# Patient Record
Sex: Female | Born: 1970 | Race: White | Hispanic: No | Marital: Single | Smoking: Never smoker
Health system: Southern US, Community
[De-identification: ages and names within clinical notes are randomized; demographics above are authoritative.]

## PROBLEM LIST (undated history)

## (undated) DIAGNOSIS — N200 Calculus of kidney: Secondary | ICD-10-CM

## (undated) DIAGNOSIS — N289 Disorder of kidney and ureter, unspecified: Secondary | ICD-10-CM

## (undated) DIAGNOSIS — H269 Unspecified cataract: Secondary | ICD-10-CM

## (undated) DIAGNOSIS — T753XXA Motion sickness, initial encounter: Secondary | ICD-10-CM

## (undated) DIAGNOSIS — F419 Anxiety disorder, unspecified: Secondary | ICD-10-CM

## (undated) DIAGNOSIS — M5432 Sciatica, left side: Secondary | ICD-10-CM

## (undated) DIAGNOSIS — B019 Varicella without complication: Secondary | ICD-10-CM

## (undated) DIAGNOSIS — K921 Melena: Secondary | ICD-10-CM

## (undated) DIAGNOSIS — H3320 Serous retinal detachment, unspecified eye: Secondary | ICD-10-CM

## (undated) DIAGNOSIS — N800 Endometriosis of uterus: Secondary | ICD-10-CM

## (undated) DIAGNOSIS — R112 Nausea with vomiting, unspecified: Secondary | ICD-10-CM

## (undated) DIAGNOSIS — A630 Anogenital (venereal) warts: Secondary | ICD-10-CM

## (undated) DIAGNOSIS — Z9889 Other specified postprocedural states: Secondary | ICD-10-CM

## (undated) HISTORY — DX: Varicella without complication: B01.9

## (undated) HISTORY — DX: Calculus of kidney: N20.0

## (undated) HISTORY — DX: Anogenital (venereal) warts: A63.0

## (undated) HISTORY — DX: Melena: K92.1

## (undated) HISTORY — DX: Disorder of kidney and ureter, unspecified: N28.9

## (undated) HISTORY — DX: Endometriosis of uterus: N80.0

## (undated) HISTORY — DX: Anxiety disorder, unspecified: F41.9

## (undated) HISTORY — PX: RETINAL DETACHMENT SURGERY: SHX105

## (undated) HISTORY — PX: OTHER SURGICAL HISTORY: SHX169

---

## 1993-05-23 DIAGNOSIS — N8000 Endometriosis of the uterus, unspecified: Secondary | ICD-10-CM

## 1993-05-23 HISTORY — DX: Endometriosis of the uterus, unspecified: N80.00

## 1993-05-23 HISTORY — DX: Endometriosis of uterus: N80.0

## 2006-03-05 HISTORY — PX: GUM SURGERY: SHX658

## 2009-09-26 ENCOUNTER — Ambulatory Visit: Payer: Self-pay | Admitting: Otolaryngology

## 2010-10-27 ENCOUNTER — Emergency Department: Payer: Self-pay | Admitting: Emergency Medicine

## 2010-11-02 ENCOUNTER — Ambulatory Visit: Payer: Self-pay | Admitting: Unknown Physician Specialty

## 2010-11-28 ENCOUNTER — Encounter: Payer: Self-pay | Admitting: Unknown Physician Specialty

## 2010-12-04 ENCOUNTER — Encounter: Payer: Self-pay | Admitting: Unknown Physician Specialty

## 2011-01-04 ENCOUNTER — Encounter: Payer: Self-pay | Admitting: Unknown Physician Specialty

## 2013-09-02 ENCOUNTER — Telehealth: Payer: Self-pay | Admitting: *Deleted

## 2013-09-02 NOTE — Telephone Encounter (Signed)
Called and per pt's sister, [name], pt has constipation and bloating . They have family hx of colon cancer in their father. They are checking their schedule and will call back to schedule OV appt.

## 2013-09-02 NOTE — Telephone Encounter (Signed)
Pt's sister called wanting to get pt scheduled for a TCS per pt's sister Dr. Antonietta Jewel is sending a referral I made pt's sister aware that we do not have a referral. Please advise when you have the referral [phone]

## 2013-09-03 NOTE — Telephone Encounter (Signed)
Pt is scheduled an OV with Margaret Emperor, NP on 10/06/2013 at 2:00 pm.

## 2013-10-06 ENCOUNTER — Encounter (INDEPENDENT_AMBULATORY_CARE_PROVIDER_SITE_OTHER): Payer: Self-pay

## 2013-10-06 ENCOUNTER — Encounter: Payer: Self-pay | Admitting: Gastroenterology

## 2013-10-06 ENCOUNTER — Ambulatory Visit: Payer: BC Managed Care – PPO | Admitting: Gastroenterology

## 2013-10-06 VITALS — BP 94/63 | HR 72 | Temp 97.9°F | Resp 18 | Ht 68.0 in | Wt 147.0 lb

## 2013-10-09 DIAGNOSIS — Z1211 Encounter for screening for malignant neoplasm of colon: Secondary | ICD-10-CM | POA: Insufficient documentation

## 2013-10-09 NOTE — Progress Notes (Signed)
Present to discuss screening colonoscopy. Insurance will not cover at [facility] as this will have hospital charges associated. Patient to seek screening colonoscopy elsewhere. NO charge for today's visit.

## 2014-05-05 ENCOUNTER — Emergency Department: Payer: Self-pay | Admitting: Emergency Medicine

## 2014-06-16 ENCOUNTER — Ambulatory Visit
Admit: 2014-06-16 | Disposition: A | Discharge status: Home / Self Care | Payer: Self-pay | Attending: Neurology | Admitting: Neurology

## 2014-06-16 LAB — HCG, QUANTITATIVE, PREGNANCY

## 2014-07-16 ENCOUNTER — Ambulatory Visit: Payer: BC Managed Care – PPO | Admitting: Nurse Practitioner

## 2014-07-26 ENCOUNTER — Ambulatory Visit: Payer: BC Managed Care – PPO | Admitting: Nurse Practitioner

## 2014-08-16 ENCOUNTER — Encounter: Payer: Self-pay | Admitting: Nurse Practitioner

## 2014-08-16 ENCOUNTER — Telehealth: Payer: Self-pay | Admitting: *Deleted

## 2014-08-16 ENCOUNTER — Ambulatory Visit (INDEPENDENT_AMBULATORY_CARE_PROVIDER_SITE_OTHER): Payer: BC Managed Care – PPO | Admitting: Nurse Practitioner

## 2014-08-16 VITALS — BP 108/62 | HR 76 | Temp 97.1°F | Resp 12 | Ht 69.0 in | Wt 141.8 lb

## 2014-08-16 DIAGNOSIS — H6123 Impacted cerumen, bilateral: Secondary | ICD-10-CM | POA: Insufficient documentation

## 2014-08-16 DIAGNOSIS — Z130 Encounter for screening for diseases of the blood and blood-forming organs and certain disorders involving the immune mechanism: Secondary | ICD-10-CM

## 2014-08-16 DIAGNOSIS — Z7189 Other specified counseling: Secondary | ICD-10-CM | POA: Diagnosis not present

## 2014-08-16 DIAGNOSIS — Z131 Encounter for screening for diabetes mellitus: Secondary | ICD-10-CM | POA: Diagnosis not present

## 2014-08-16 DIAGNOSIS — Z1211 Encounter for screening for malignant neoplasm of colon: Secondary | ICD-10-CM

## 2014-08-16 DIAGNOSIS — E538 Deficiency of other specified B group vitamins: Secondary | ICD-10-CM | POA: Insufficient documentation

## 2014-08-16 DIAGNOSIS — Z7689 Persons encountering health services in other specified circumstances: Secondary | ICD-10-CM | POA: Insufficient documentation

## 2014-08-16 LAB — CBC WITH DIFFERENTIAL/PLATELET
Basophils Absolute: 0 10*3/uL (ref 0.0–0.1)
Basophils Relative: 0.2 % (ref 0.0–3.0)
EOS ABS: 0.1 10*3/uL (ref 0.0–0.7)
EOS PCT: 1.5 % (ref 0.0–5.0)
HEMATOCRIT: 40.4 % (ref 36.0–46.0)
Hemoglobin: 13.3 g/dL (ref 12.0–15.0)
LYMPHS ABS: 2.2 10*3/uL (ref 0.7–4.0)
Lymphocytes Relative: 33.2 % (ref 12.0–46.0)
MCHC: 33 g/dL (ref 30.0–36.0)
MCV: 91.4 fl (ref 78.0–100.0)
MONO ABS: 0.5 10*3/uL (ref 0.1–1.0)
Monocytes Relative: 8.1 % (ref 3.0–12.0)
NEUTROS PCT: 57 % (ref 43.0–77.0)
Neutro Abs: 3.8 10*3/uL (ref 1.4–7.7)
Platelets: 237 10*3/uL (ref 150.0–400.0)
RBC: 4.42 Mil/uL (ref 3.87–5.11)
RDW: 13.7 % (ref 11.5–15.5)
WBC: 6.7 10*3/uL (ref 4.0–10.5)

## 2014-08-16 LAB — HEMOGLOBIN A1C: Hgb A1c MFr Bld: 5.3 % (ref 4.6–6.5)

## 2014-08-16 NOTE — Telephone Encounter (Signed)
Called pt to come back to have blood re-draw and explained that it was my error

## 2014-08-16 NOTE — Telephone Encounter (Signed)
Pt came back.  

## 2014-08-16 NOTE — Assessment & Plan Note (Addendum)
Discussed acute and chronic issues. Reviewed health maintenance measures, PFSHx, and immunizations. Obtain routine labs CBC w/ diff and A1c today, non-fasting. Will obtain fasting labs in 3 months

## 2014-08-16 NOTE — Progress Notes (Signed)
Pre visit review using our clinic review tool, if applicable. No additional management support is needed unless otherwise documented below in the visit note. 

## 2014-08-16 NOTE — Assessment & Plan Note (Signed)
Set up for next week. Family hx of colon cancer.

## 2014-08-16 NOTE — Patient Instructions (Addendum)
Please visit the lab before leaving today.   Follow up in 3 months for fasting labs and physical if you are due for one.   Welcome to [facility]!

## 2014-08-16 NOTE — Assessment & Plan Note (Signed)
Patient seeing Dr. Manuella Ghazi for B12 replacement therapy. Pt has 3 months left she reports. Positive for tingling in bilateral legs

## 2014-08-16 NOTE — Assessment & Plan Note (Signed)
Patient (and sister) report excessive wax, they have debrox at home. Today pt TM's are visible without signs of infection or disease process. Asked pt to return if worsens or decreased hearing bilaterally.

## 2014-08-16 NOTE — Progress Notes (Signed)
Subjective:    Patient ID: Margaret Mcfarland, female    DOB: 06-21-1970, 44 y.o.   MRN: [id number]  HPI  Margaret Mcfarland is a 44 yo female establishing care today.   1) New pt info:  Immunizations- tdap 5 yrs ago  Mammogram- 2015- normal   Pap- 2015; sees GYN, normal   Colonoscopy- getting colonoscopy on 08/26/14  Eye Exam- 2015, new prescription for glasses   Dental Exam- UTD; going next month   2) Chronic Problems-  Dr. Manuella Ghazi- Neurology; B-12 shots monthly with 3 left; tingling in both legs   3) Acute Problems- Margaret Mcfarland would like to me to check her ears, denies current problems and has been checked out by ENT in past. Margaret Mcfarland reports Left ear- decreased hearing and some dizziness with looking over shoulder.     Review of Systems  Constitutional: Negative for fever, chills, diaphoresis and fatigue.  HENT: Positive for hearing loss. Negative for ear discharge and ear pain.   Respiratory: Negative for chest tightness, shortness of breath and wheezing.   Cardiovascular: Negative for chest pain, palpitations and leg swelling.  Gastrointestinal: Negative for nausea, vomiting and diarrhea.  Skin: Negative for rash.  Neurological: Negative for dizziness, weakness, numbness and headaches.  Psychiatric/Behavioral: The patient is not nervous/anxious.    Past Medical History  Diagnosis Date  . Blood in stool   . Chicken pox   . Genital warts     History   Social History  . Marital Status: Single    Spouse Name: N/A  . Number of Children: N/A  . Years of Education: N/A   Occupational History  . Not on file.   Social History Main Topics  . Smoking status: Never Smoker   . Smokeless tobacco: Never Used  . Alcohol Use: No  . Drug Use: No  . Sexual Activity: No   Other Topics Concern  . Not on file   Social History Narrative   Lives with twin sister   Work- [employer]    No pets    No children    Right handed    No caffeine daily- tea occasionally; eats chocolate      Enjoys- shopping and eating out     Past Surgical History  Procedure Laterality Date  . Gum surgery  2008  . Endoscopy    . Laproscopy      Family History  Problem Relation Age of Onset  . Colon cancer Father   . Arthritis Father   . Cancer Father     colon/prostate  . Arthritis Mother   . Hyperlipidemia Mother   . Heart disease Mother   . Stroke Mother   . Hypertension Mother   . Kidney disease Mother   . Diabetes Mother   . Stroke Maternal Grandmother   . Cancer Maternal Grandfather     prostate  . Stroke Paternal Grandfather     No Known Allergies  Current Outpatient Prescriptions on File Prior to Visit  Medication Sig Dispense Refill  . Levonorgest-Eth Estrad 91-Day (JOLESSA PO) Take by mouth daily.     No current facility-administered medications on file prior to visit.      Objective:   Physical Exam  Constitutional: Margaret Mcfarland is oriented to person, place, and time. Margaret Mcfarland appears well-developed and well-nourished. No distress.  BP 108/62 mmHg  Pulse 76  Temp(Src) 97.1 F (36.2 C) (Oral)  Resp 12  Ht 5\' 9"  (1.753 m)  Wt 141 lb 12.8 oz (64.32 kg)  BMI 20.93 kg/m2  SpO2 96%  LMP 02/14/2014   HENT:  Head: Normocephalic and atraumatic.  Right Ear: External ear normal.  Left Ear: External ear normal.  Nose: Nose normal.  Mouth/Throat: Oropharynx is clear and moist. No oropharyngeal exudate.  TMs and canals clear and visible with minimal cerumen in each canal  Eyes: Conjunctivae and EOM are normal. Pupils are equal, round, and reactive to light. Right eye exhibits no discharge. Left eye exhibits no discharge. No scleral icterus.  Neck: Normal range of motion. Neck supple. No thyromegaly present.  Cardiovascular: Normal rate, regular rhythm and normal heart sounds.  Exam reveals no gallop and no friction rub.   No murmur heard. Pulmonary/Chest: Effort normal and breath sounds normal. No respiratory distress. Margaret Mcfarland has no wheezes. Margaret Mcfarland has no rales. Margaret Mcfarland exhibits no  tenderness.  Musculoskeletal: Normal range of motion. Margaret Mcfarland exhibits no edema or tenderness.  Lymphadenopathy:    Margaret Mcfarland has no cervical adenopathy.  Neurological: Margaret Mcfarland is alert and oriented to person, place, and time. Margaret Mcfarland has normal reflexes. No cranial nerve deficit. Margaret Mcfarland exhibits normal muscle tone. Coordination normal.  Skin: Skin is warm and dry. No rash noted. Margaret Mcfarland is not diaphoretic. No erythema. No pallor.  Psychiatric: Margaret Mcfarland has a normal mood and affect. Her behavior is normal. Judgment and thought content normal.      Assessment & Plan:

## 2014-08-18 ENCOUNTER — Ambulatory Visit: Payer: BC Managed Care – PPO | Admitting: Nurse Practitioner

## 2014-08-25 ENCOUNTER — Encounter: Payer: Self-pay | Admitting: *Deleted

## 2014-08-25 ENCOUNTER — Ambulatory Visit: Payer: BC Managed Care – PPO | Admitting: Anesthesiology

## 2014-08-25 ENCOUNTER — Encounter
Admission: RE | Disposition: A | Discharge status: Home / Self Care | Payer: Self-pay | Source: Ambulatory Visit | Attending: Unknown Physician Specialty

## 2014-08-25 ENCOUNTER — Ambulatory Visit
Admission: RE | Admit: 2014-08-25 | Discharge: 2014-08-25 | Disposition: A | Discharge status: Home / Self Care | Payer: BC Managed Care – PPO | Source: Ambulatory Visit | Attending: Unknown Physician Specialty | Admitting: Unknown Physician Specialty

## 2014-08-25 DIAGNOSIS — K648 Other hemorrhoids: Secondary | ICD-10-CM | POA: Insufficient documentation

## 2014-08-25 DIAGNOSIS — K621 Rectal polyp: Secondary | ICD-10-CM | POA: Diagnosis not present

## 2014-08-25 DIAGNOSIS — Z1211 Encounter for screening for malignant neoplasm of colon: Secondary | ICD-10-CM | POA: Diagnosis present

## 2014-08-25 DIAGNOSIS — Z800 Family history of malignant neoplasm of digestive organs: Secondary | ICD-10-CM | POA: Insufficient documentation

## 2014-08-25 DIAGNOSIS — Z79899 Other long term (current) drug therapy: Secondary | ICD-10-CM | POA: Insufficient documentation

## 2014-08-25 HISTORY — PX: COLONOSCOPY: SHX5424

## 2014-08-25 LAB — POCT PREGNANCY, URINE: Preg Test, Ur: NEGATIVE

## 2014-08-25 SURGERY — COLONOSCOPY
Anesthesia: General

## 2014-08-25 MED ORDER — LACTATED RINGERS IV SOLN
INTRAVENOUS | Status: DC
  Administered 2014-08-25: 13:00:00 via INTRAVENOUS

## 2014-08-25 MED ORDER — SODIUM CHLORIDE 0.9 % IV SOLN
INTRAVENOUS | Status: DC
Start: 2014-08-25 — End: 2014-08-25

## 2014-08-25 MED ORDER — PROPOFOL INFUSION 10 MG/ML OPTIME
INTRAVENOUS | Status: DC | PRN
  Administered 2014-08-25: 120 ug/kg/min via INTRAVENOUS

## 2014-08-25 MED ORDER — FENTANYL CITRATE (PF) 100 MCG/2ML IJ SOLN
INTRAMUSCULAR | Status: DC | PRN
  Administered 2014-08-25: 50 ug via INTRAVENOUS

## 2014-08-25 MED ORDER — MIDAZOLAM HCL 2 MG/2ML IJ SOLN
INTRAMUSCULAR | Status: DC | PRN
Start: 2014-08-25 — End: 2014-08-25
  Administered 2014-08-25 (×2): 1 mg via INTRAVENOUS

## 2014-08-25 MED ORDER — EPHEDRINE SULFATE 50 MG/ML IJ SOLN
INTRAMUSCULAR | Status: DC | PRN
  Administered 2014-08-25: 5 mg via INTRAVENOUS

## 2014-08-25 NOTE — Anesthesia Postprocedure Evaluation (Signed)
  Anesthesia Post-op Note  Patient: Margaret Mcfarland  Procedure(s) Performed: Procedure(s): COLONOSCOPY (N/A)  Anesthesia type:General  Patient location: PACU  Post pain: Pain level controlled  Post assessment: Post-op Vital signs reviewed, Patient's Cardiovascular Status Stable, Respiratory Function Stable, Patent Airway and No signs of Nausea or vomiting  Post vital signs: Reviewed and stable  Last Vitals:  Filed Vitals:   08/25/14 1317  Pulse: 68  Temp: 36.4 C  Resp: 16    Level of consciousness: awake, alert  and patient cooperative  Complications: No apparent anesthesia complications

## 2014-08-25 NOTE — Op Note (Signed)
[facility] Patient Name: Margaret Mcfarland Procedure Date: 08/25/2014 1:49 PM MRN: [id number] Account #: [id number] Date of Birth: 1971/02/02 Admit Type: Outpatient Age: 44 Room: [facility] ENDO ROOM 4 Gender: Female Note Status: Finalized Procedure:         Colonoscopy Indications:       Screening in patient at increased risk: Family history of                     1st-degree relative with colorectal cancer Providers:         Manya Silvas, MD Referring MD:      Lorane Gell (Referring MD) Medicines:         Propofol per Anesthesia Complications:     No immediate complications. Procedure:         Pre-Anesthesia Assessment:                    - After reviewing the risks and benefits, the patient was                     deemed in satisfactory condition to undergo the procedure.                    After obtaining informed consent, the colonoscope was                     passed under direct vision. Throughout the procedure, the                     patient's blood pressure, pulse, and oxygen saturations                     were monitored continuously. The Colonoscope was                     introduced through the anus and advanced to the the cecum,                     identified by appendiceal orifice and ileocecal valve. The                     colonoscopy was performed without difficulty. The patient                     tolerated the procedure well. The quality of the bowel                     preparation was good. Findings:      A 5 mm polyp was found in the rectum. The polyp was sessile. The polyp       was removed with a hot snare. Resection and retrieval were complete.      Internal hemorrhoids were found during endoscopy. The hemorrhoids were       medium-sized and Grade I (internal hemorrhoids that do not prolapse).      The exam was otherwise without abnormality. Impression:        - One 5 mm polyp in the rectum. Resected and retrieved.       - Internal hemorrhoids.                    - The examination was otherwise normal. Recommendation:    - Await pathology results. Probably repeat in 5 years. Manya Silvas, MD 08/25/2014 2:24:36 PM This report has  been signed electronically. Number of Addenda: 0 Note Initiated On: 08/25/2014 1:49 PM Scope Withdrawal Time: 0 hours 16 minutes 6 seconds  Total Procedure Duration: 0 hours 26 minutes 13 seconds       [facility]

## 2014-08-25 NOTE — Anesthesia Preprocedure Evaluation (Signed)
Anesthesia Evaluation  Patient identified by MRN, date of birth, ID band Patient awake    Reviewed: Allergy & Precautions, NPO status , Patient's Chart, lab work & pertinent test results  History of Anesthesia Complications (+) history of anesthetic complications  Airway Mallampati: I       Dental no notable dental hx.    Pulmonary neg pulmonary ROS,    Pulmonary exam normal       Cardiovascular negative cardio ROS Normal cardiovascular exam    Neuro/Psych negative neurological ROS  negative psych ROS   GI/Hepatic negative GI ROS, Neg liver ROS,   Endo/Other  negative endocrine ROS  Renal/GU negative Renal ROS  negative genitourinary   Musculoskeletal negative musculoskeletal ROS (+)   Abdominal Normal abdominal exam  (+)   Peds negative pediatric ROS (+)  Hematology negative hematology ROS (+)   Anesthesia Other Findings   Reproductive/Obstetrics negative OB ROS                             Anesthesia Physical Anesthesia Plan  ASA: I  Anesthesia Plan: General   Post-op Pain Management:    Induction: Intravenous  Airway Management Planned: Nasal Cannula  Additional Equipment:   Intra-op Plan:   Post-operative Plan:   Informed Consent: I have reviewed the patients History and Physical, chart, labs and discussed the procedure including the risks, benefits and alternatives for the proposed anesthesia with the patient or authorized representative who has indicated his/her understanding and acceptance.     Plan Discussed with: CRNA  Anesthesia Plan Comments:         Anesthesia Quick Evaluation

## 2014-08-25 NOTE — H&P (Signed)
Primary Care Physician:  Rubbie Battiest, NP Primary Gastroenterologist:  Dr. Vira Agar  Pre-Procedure History & Physical: HPI:  Margaret Mcfarland is a 44 y.o. female is here for an colonoscopy.   Past Medical History  Diagnosis Date  . Blood in stool   . Chicken pox   . Genital warts     Past Surgical History  Procedure Laterality Date  . Gum surgery  2008  . Endoscopy    . Laproscopy      Prior to Admission medications   Medication Sig Start Date End Date Taking? Authorizing Provider  Calcium Carbonate-Vitamin D (CALCIUM-VITAMIN D) 500-200 MG-UNIT per tablet Take 1 tablet by mouth 2 (two) times daily.   Yes Historical Provider, MD  cyanocobalamin 100 MCG tablet Take 100 mcg by mouth daily.   Yes Historical Provider, MD  Levonorgest-Eth Estrad 91-Day (JOLESSA PO) Take by mouth daily.   Yes Historical Provider, MD    Allergies as of 07/06/2014  . (Not on File)    Family History  Problem Relation Age of Onset  . Colon cancer Father   . Arthritis Father   . Cancer Father     colon/prostate  . Arthritis Mother   . Hyperlipidemia Mother   . Heart disease Mother   . Stroke Mother   . Hypertension Mother   . Kidney disease Mother   . Diabetes Mother   . Stroke Maternal Grandmother   . Cancer Maternal Grandfather     prostate  . Stroke Paternal Grandfather     History   Social History  . Marital Status: Single    Spouse Name: N/A  . Number of Children: N/A  . Years of Education: N/A   Occupational History  . Not on file.   Social History Main Topics  . Smoking status: Never Smoker   . Smokeless tobacco: Never Used  . Alcohol Use: No  . Drug Use: No  . Sexual Activity: No   Other Topics Concern  . Not on file   Social History Narrative   Lives with twin sister   Work- Acupuncturist    No pets    No children    Right handed    No caffeine daily- tea occasionally; eats chocolate    Enjoys- shopping and eating out     Review of  Systems: See HPI, otherwise negative ROS  Physical Exam: Pulse 68  Temp(Src) 97.5 F (36.4 C) (Tympanic)  Resp 16  Ht 5\' 8"  (1.727 m)  Wt 63.504 kg (140 lb)  BMI 21.29 kg/m2  SpO2 100% General:   Alert,  pleasant and cooperative in NAD Head:  Normocephalic and atraumatic. Neck:  Supple; no masses or thyromegaly. Lungs:  Clear throughout to auscultation.    Heart:  Regular rate and rhythm. Abdomen:  Soft, nontender and nondistended. Normal bowel sounds, without guarding, and without rebound.   Neurologic:  Alert and  oriented x4;  grossly normal neurologically.  Impression/Plan: Margaret Mcfarland is here for an colonoscopy to be performed for screening of family history of colon cancer in father  Risks, benefits, limitations, and alternatives regarding  colonoscopy have been reviewed with the patient.  Questions have been answered.  All parties agreeable.   Gaylyn Cheers, MD  08/25/2014, 1:45 PM   Primary Care Physician:  Rubbie Battiest, NP Primary Gastroenterologist:  Dr. Vira Agar  Pre-Procedure History & Physical: HPI:  Margaret Mcfarland is a 44 y.o. female is here for an colonoscopy.   Past Medical  History  Diagnosis Date  . Blood in stool   . Chicken pox   . Genital warts     Past Surgical History  Procedure Laterality Date  . Gum surgery  2008  . Endoscopy    . Laproscopy      Prior to Admission medications   Medication Sig Start Date End Date Taking? Authorizing Provider  Calcium Carbonate-Vitamin D (CALCIUM-VITAMIN D) 500-200 MG-UNIT per tablet Take 1 tablet by mouth 2 (two) times daily.   Yes Historical Provider, MD  cyanocobalamin 100 MCG tablet Take 100 mcg by mouth daily.   Yes Historical Provider, MD  Levonorgest-Eth Estrad 91-Day (JOLESSA PO) Take by mouth daily.   Yes Historical Provider, MD    Allergies as of 07/06/2014  . (Not on File)    Family History  Problem Relation Age of Onset  . Colon cancer Father   . Arthritis Father   . Cancer Father      colon/prostate  . Arthritis Mother   . Hyperlipidemia Mother   . Heart disease Mother   . Stroke Mother   . Hypertension Mother   . Kidney disease Mother   . Diabetes Mother   . Stroke Maternal Grandmother   . Cancer Maternal Grandfather     prostate  . Stroke Paternal Grandfather     History   Social History  . Marital Status: Single    Spouse Name: N/A  . Number of Children: N/A  . Years of Education: N/A   Occupational History  . Not on file.   Social History Main Topics  . Smoking status: Never Smoker   . Smokeless tobacco: Never Used  . Alcohol Use: No  . Drug Use: No  . Sexual Activity: No   Other Topics Concern  . Not on file   Social History Narrative   Lives with twin sister   Work- Acupuncturist    No pets    No children    Right handed    No caffeine daily- tea occasionally; eats chocolate    Enjoys- shopping and eating out     Review of Systems: See HPI, otherwise negative ROS  Physical Exam: Pulse 68  Temp(Src) 97.5 F (36.4 C) (Tympanic)  Resp 16  Ht 5\' 8"  (1.727 m)  Wt 63.504 kg (140 lb)  BMI 21.29 kg/m2  SpO2 100% General:   Alert,  pleasant and cooperative in NAD Head:  Normocephalic and atraumatic. Neck:  Supple; no masses or thyromegaly. Lungs:  Clear throughout to auscultation.    Heart:  Regular rate and rhythm. Abdomen:  Soft, nontender and nondistended. Normal bowel sounds, without guarding, and without rebound.   Neurologic:  Alert and  oriented x4;  grossly normal neurologically.  Impression/Plan: Margaret Mcfarland is here for an colonoscopy to be performed for family history of colon cancer in father  Risks, benefits, limitations, and alternatives regarding  colonoscopy have been reviewed with the patient.  Questions have been answered.  All parties agreeable.   Gaylyn Cheers, MD  08/25/2014, 1:45 PM

## 2014-08-25 NOTE — Anesthesia Procedure Notes (Signed)
Performed by: Vaughan Sine Pre-anesthesia Checklist: Patient identified, Emergency Drugs available, Suction available and Patient being monitored Patient Re-evaluated:Patient Re-evaluated prior to inductionOxygen Delivery Method: Nasal cannula Preoxygenation: Pre-oxygenation with 100% oxygen Intubation Type: IV induction Placement Confirmation: positive ETCO2

## 2014-08-25 NOTE — Transfer of Care (Signed)
Immediate Anesthesia Transfer of Care Note  Patient: Margaret Mcfarland  Procedure(s) Performed: Procedure(s): COLONOSCOPY (N/A)  Patient Location: PACU  Anesthesia Type:General  Level of Consciousness: awake and sedated  Airway & Oxygen Therapy: Patient Spontanous Breathing and Patient connected to nasal cannula oxygen  Post-op Assessment: Report given to RN and Post -op Vital signs reviewed and stable  Post vital signs: Reviewed and stable  Last Vitals:  Filed Vitals:   08/25/14 1317  Pulse: 68  Temp: 36.4 C  Resp: 16    Complications: No apparent anesthesia complications

## 2014-08-26 ENCOUNTER — Ambulatory Visit
Admission: RE | Admit: 2014-08-26 | Payer: BC Managed Care – PPO | Source: Ambulatory Visit | Admitting: Unknown Physician Specialty

## 2014-08-26 ENCOUNTER — Encounter: Admission: RE | Payer: Self-pay | Source: Ambulatory Visit

## 2014-08-26 SURGERY — COLONOSCOPY
Anesthesia: General

## 2014-08-30 LAB — SURGICAL PATHOLOGY

## 2014-10-21 LAB — HM MAMMOGRAPHY: HM Mammogram: NEGATIVE

## 2015-04-07 ENCOUNTER — Ambulatory Visit: Payer: BC Managed Care – PPO | Admitting: Nurse Practitioner

## 2015-07-01 ENCOUNTER — Ambulatory Visit (INDEPENDENT_AMBULATORY_CARE_PROVIDER_SITE_OTHER): Payer: BC Managed Care – PPO | Admitting: Nurse Practitioner

## 2015-07-01 ENCOUNTER — Encounter: Payer: Self-pay | Admitting: Nurse Practitioner

## 2015-07-01 VITALS — BP 110/70 | HR 89 | Resp 12 | Ht 68.0 in | Wt 147.0 lb

## 2015-07-01 DIAGNOSIS — D692 Other nonthrombocytopenic purpura: Secondary | ICD-10-CM

## 2015-07-01 LAB — BASIC METABOLIC PANEL
BUN: 17 mg/dL (ref 7–25)
CHLORIDE: 103 mmol/L (ref 98–110)
CO2: 28 mmol/L (ref 20–31)
Calcium: 9.2 mg/dL (ref 8.6–10.2)
Creat: 0.73 mg/dL (ref 0.50–1.10)
Glucose, Bld: 80 mg/dL (ref 65–99)
POTASSIUM: 4.8 mmol/L (ref 3.5–5.3)
Sodium: 139 mmol/L (ref 135–146)

## 2015-07-01 LAB — PROTIME-INR
INR: 1.05 (ref <1.50)
Prothrombin Time: 13.8 seconds (ref 11.6–15.2)

## 2015-07-01 LAB — CBC WITH DIFFERENTIAL/PLATELET
Basophils Absolute: 0 cells/uL (ref 0–200)
Basophils Relative: 0 %
Eosinophils Absolute: 53 cells/uL (ref 15–500)
Eosinophils Relative: 1 %
HCT: 42.9 % (ref 35.0–45.0)
HEMOGLOBIN: 13.9 g/dL (ref 11.7–15.5)
LYMPHS ABS: 2067 {cells}/uL (ref 850–3900)
Lymphocytes Relative: 39 %
MCH: 29.8 pg (ref 27.0–33.0)
MCHC: 32.4 g/dL (ref 32.0–36.0)
MCV: 91.9 fL (ref 80.0–100.0)
MPV: 9.8 fL (ref 7.5–12.5)
Monocytes Absolute: 530 cells/uL (ref 200–950)
Monocytes Relative: 10 %
NEUTROS ABS: 2650 {cells}/uL (ref 1500–7800)
Neutrophils Relative %: 50 %
Platelets: 242 10*3/uL (ref 140–400)
RBC: 4.67 MIL/uL (ref 3.80–5.10)
RDW: 13.3 % (ref 11.0–15.0)
WBC: 5.3 10*3/uL (ref 3.8–10.8)

## 2015-07-01 NOTE — Progress Notes (Signed)
Patient ID: Margaret Mcfarland, female    DOB: 1970/05/31  Age: 45 y.o. MRN: [id number]  CC: Acute Visit   HPI Margaret Mcfarland presents for CC of rash x 1.5 weeks. She is accompanied by her sister today and pt is a difficult historian.   1) Rash on legs below the knee x 1.5 weeks Legs feel tight, burning sensation Dull sensation as well  Pt denies any other symptoms   History Margaret Mcfarland has a past medical history of Blood in stool; Chicken pox; and Genital warts.   She has past surgical history that includes Gum surgery (2008); endoscopy; laproscopy; and Colonoscopy (N/A, 08/25/2014).   Her family history includes Arthritis in her father and mother; Cancer in her father and maternal grandfather; Colon cancer in her father; Diabetes in her mother; Heart disease in her mother; Hyperlipidemia in her mother; Hypertension in her mother; Kidney disease in her mother; Stroke in her maternal grandmother, mother, and paternal grandfather.She reports that she has never smoked. She has never used smokeless tobacco. She reports that she does not drink alcohol or use illicit drugs.  Outpatient Prescriptions Prior to Visit  Medication Sig Dispense Refill  . Calcium Carbonate-Vitamin D (CALCIUM-VITAMIN D) 500-200 MG-UNIT per tablet Take 1 tablet by mouth 2 (two) times daily.    Margaret Mcfarland 91-Day (JOLESSA PO) Take by mouth daily.    . cyanocobalamin 100 MCG tablet Take 100 mcg by mouth daily. Reported on 07/01/2015     No facility-administered medications prior to visit.    ROS Review of Systems  Constitutional: Negative for fever, chills, diaphoresis and fatigue.  Respiratory: Negative for chest tightness, shortness of breath and wheezing.   Cardiovascular: Negative for chest pain, palpitations and leg swelling.  Gastrointestinal: Negative for nausea, vomiting and diarrhea.  Skin: Positive for rash.  Neurological: Negative for dizziness and headaches.    Objective:  BP 110/70 mmHg  Pulse 89  Resp  12  Ht 5\' 8"  (1.727 m)  Wt 147 lb (66.679 kg)  BMI 22.36 kg/m2  SpO2 97%  Physical Exam  Constitutional: She is oriented to person, place, and time. She appears well-developed and well-nourished. No distress.  HENT:  Head: Normocephalic and atraumatic.  Right Ear: External ear normal.  Left Ear: External ear normal.  Cardiovascular: Normal rate, regular rhythm and normal heart sounds.   Pulmonary/Chest: Effort normal and breath sounds normal. No respiratory distress. She has no wheezes. She has no rales. She exhibits no tenderness.  Neurological: She is alert and oriented to person, place, and time. No cranial nerve deficit. She exhibits normal muscle tone. Coordination normal.  Skin: Skin is warm and dry. No rash noted. She is not diaphoretic. There is erythema.  No rash apparent- there is small petechiae of both legs only below the knee   Psychiatric: She has a normal mood and affect. Her behavior is normal. Judgment and thought content normal.   Assessment & Plan:   Margaret Mcfarland was seen today for acute visit.  Diagnoses and all orders for this visit:  Purpura ([facility]) -     Cancel: CBC with Differential/Platelet -     Cancel: Basic Metabolic Panel (BMET) -     Cancel: INR/PT -     Basic Metabolic Panel (BMET) -     CBC with Differential/Platelet; Future -     INR/PT -     CBC with Differential/Platelet   I am having Margaret Mcfarland maintain her Levonorgest-Eth Mcfarland 91-Day (JOLESSA PO), calcium-vitamin D, and cyanocobalamin.  No orders of the defined types were placed in this encounter.     Follow-up: Return if symptoms worsen or fail to improve.

## 2015-07-01 NOTE — Patient Instructions (Signed)
Wear your compression hose when you are on your feet, take off at night.   Please visit the lab today. We will contact you with results.

## 2015-07-06 ENCOUNTER — Telehealth: Payer: Self-pay

## 2015-07-06 DIAGNOSIS — D692 Other nonthrombocytopenic purpura: Secondary | ICD-10-CM | POA: Insufficient documentation

## 2015-07-06 NOTE — Assessment & Plan Note (Signed)
Checking labs today  Discussed possibilities with pt  Pt has compression hose- asked her to try this to help with the sensation

## 2015-07-06 NOTE — Telephone Encounter (Signed)
-----   Message from Rubbie Battiest, NP sent at 07/06/2015  1:55 PM EDT ----- Please let Margaret Mcfarland know her labs were normal. How are her legs doing?

## 2015-07-06 NOTE — Telephone Encounter (Signed)
Called patient. Not available. Will try later.

## 2015-07-07 NOTE — Telephone Encounter (Signed)
Tried to reach patient. No answer.  

## 2015-07-08 NOTE — Telephone Encounter (Signed)
Please call patient with her results at this number [phone].

## 2015-07-08 NOTE — Telephone Encounter (Signed)
Spoke with patient. Patient advised of results. Patient states that legs are still bothering her when she stands for long periods of time. Patient states that the breakout is improving. Patient also would like to know what you think this may be? Please Advise.

## 2015-07-12 NOTE — Telephone Encounter (Signed)
Spoke with the patient,  Redness has gone away.  Was using the compression hose up until two days ago and her legs feel great now .  She will let us know if it gets worse again.

## 2015-07-12 NOTE — Telephone Encounter (Signed)
Good news! THanks

## 2015-07-12 NOTE — Telephone Encounter (Signed)
Is she trying the compression hose? I am glad it is improving. I am unsure of the cause at this time, but is usually due to some sort of inflammation from a million different causes. Her labs were normal so that was reassuring.

## 2015-08-17 ENCOUNTER — Ambulatory Visit: Payer: BC Managed Care – PPO | Admitting: Primary Care

## 2015-08-18 ENCOUNTER — Encounter: Payer: Self-pay | Admitting: *Deleted

## 2015-08-18 ENCOUNTER — Encounter: Payer: Self-pay | Admitting: Primary Care

## 2015-08-18 ENCOUNTER — Ambulatory Visit (INDEPENDENT_AMBULATORY_CARE_PROVIDER_SITE_OTHER): Payer: BC Managed Care – PPO | Admitting: Primary Care

## 2015-08-18 VITALS — BP 110/70 | HR 59 | Temp 97.7°F | Ht 68.0 in | Wt 140.1 lb

## 2015-08-18 DIAGNOSIS — E538 Deficiency of other specified B group vitamins: Secondary | ICD-10-CM

## 2015-08-18 DIAGNOSIS — H6123 Impacted cerumen, bilateral: Secondary | ICD-10-CM

## 2015-08-18 DIAGNOSIS — Z0001 Encounter for general adult medical examination with abnormal findings: Secondary | ICD-10-CM | POA: Diagnosis not present

## 2015-08-18 DIAGNOSIS — L237 Allergic contact dermatitis due to plants, except food: Secondary | ICD-10-CM | POA: Diagnosis not present

## 2015-08-18 DIAGNOSIS — Z0000 Encounter for general adult medical examination without abnormal findings: Secondary | ICD-10-CM | POA: Insufficient documentation

## 2015-08-18 LAB — LIPID PANEL
CHOLESTEROL: 174 mg/dL (ref 0–200)
HDL: 45.4 mg/dL (ref >39.00)
LDL Cholesterol: 113 mg/dL — ABNORMAL HIGH (ref 0–99)
NonHDL: 128.75
Total CHOL/HDL Ratio: 4
Triglycerides: 80 mg/dL (ref 0.0–149.0)
VLDL: 16 mg/dL (ref 0.0–40.0)

## 2015-08-18 LAB — COMPREHENSIVE METABOLIC PANEL
ALT: 11 U/L (ref 0–35)
AST: 13 U/L (ref 0–37)
Albumin: 4.4 g/dL (ref 3.5–5.2)
Alkaline Phosphatase: 29 U/L — ABNORMAL LOW (ref 39–117)
BILIRUBIN TOTAL: 0.9 mg/dL (ref 0.2–1.2)
BUN: 13 mg/dL (ref 6–23)
CALCIUM: 9.3 mg/dL (ref 8.4–10.5)
CO2: 27 meq/L (ref 19–32)
CREATININE: 0.76 mg/dL (ref 0.40–1.20)
Chloride: 105 mEq/L (ref 96–112)
GFR: 86.67 mL/min (ref >60.00)
Glucose, Bld: 84 mg/dL (ref 70–99)
Potassium: 3.9 mEq/L (ref 3.5–5.1)
Sodium: 137 mEq/L (ref 135–145)
Total Protein: 7.6 g/dL (ref 6.0–8.3)

## 2015-08-18 LAB — HEMOGLOBIN A1C: HEMOGLOBIN A1C: 5.6 % (ref 4.6–6.5)

## 2015-08-18 LAB — CBC
HCT: 43.6 % (ref 36.0–46.0)
Hemoglobin: 14.6 g/dL (ref 12.0–15.0)
MCHC: 33.4 g/dL (ref 30.0–36.0)
MCV: 90.4 fl (ref 78.0–100.0)
Platelets: 242 10*3/uL (ref 150.0–400.0)
RBC: 4.82 Mil/uL (ref 3.87–5.11)
RDW: 12.7 % (ref 11.5–15.5)
WBC: 5.2 10*3/uL (ref 4.0–10.5)

## 2015-08-18 LAB — VITAMIN B12: Vitamin B-12: 281 pg/mL (ref 211–911)

## 2015-08-18 MED ORDER — TRIAMCINOLONE ACETONIDE 0.1 % EX CREA
1.0000 | TOPICAL_CREAM | Freq: Two times a day (BID) | CUTANEOUS | Status: DC
Start: 2015-08-18 — End: 2016-07-18

## 2015-08-18 NOTE — Progress Notes (Signed)
Subjective:    Patient ID: Margaret Mcfarland, female    DOB: Jul 19, 1970, 45 y.o.   MRN: [id number]  HPI  Margaret Mcfarland is a 45 year old female who presents today to transfer care from [facility] and for complete physical.  Immunizations: -Tetanus: Completed in 2011 -Influenza: Did not complete last season.   Diet: She endorses a fair diet. Breakfast: Oatmeal, pancakes, toaster stroodle, biscuits, granola bars Lunch: [employer] Dinner: Hot dogs, chicken pie, pizza, burgers Snacks: Chocolate, ice cream, cookies Desserts: See above Beverages: Water, sweet tea  Exercise: She does not currently exercise Eye exam: Completed 2 years ago Dental exam: Completed 4 months ago Colonoscopy: Completed in 2016, due in 5 years Pap Smear: Completed in August 2016 Mammogram: Completed in August 2016   Review of Systems  Constitutional: Negative for unexpected weight change.  HENT: Negative for rhinorrhea.   Respiratory: Negative for cough and shortness of breath.   Cardiovascular: Negative for chest pain.  Gastrointestinal: Positive for constipation. Negative for diarrhea.       Uses occasional miralax  Genitourinary: Negative for difficulty urinating and menstrual problem.  Musculoskeletal: Negative for myalgias and arthralgias.  Skin: Positive for rash.       Has been working out in her yard recently.  Allergic/Immunologic: Positive for environmental allergies.  Neurological: Negative for numbness and headaches.       Occasional dizziness  Psychiatric/Behavioral:       Denies concerns for anxiety or depression       Past Medical History  Diagnosis Date  . Blood in stool   . Chicken pox   . Genital warts      Social History   Social History  . Marital Status: Single    Spouse Name: N/A  . Number of Children: N/A  . Years of Education: N/A   Occupational History  . Not on file.   Social History Main Topics  . Smoking status: Never Smoker   . Smokeless  tobacco: Never Used  . Alcohol Use: No  . Drug Use: No  . Sexual Activity: No   Other Topics Concern  . Not on file   Social History Narrative   Lives with twin sister   Work- [employer]    No pets    No children    Right handed    No caffeine daily- tea occasionally; eats chocolate    Enjoys- shopping and eating out     Past Surgical History  Procedure Laterality Date  . Gum surgery  2008  . Endoscopy    . Laproscopy    . Colonoscopy N/A 08/25/2014    Procedure: COLONOSCOPY;  Surgeon: Manya Silvas, MD;  Location: [facility] ENDOSCOPY;  Service: Endoscopy;  Laterality: N/A;    Family History  Problem Relation Age of Onset  . Colon cancer Father   . Arthritis Father   . Cancer Father     colon/prostate  . Arthritis Mother   . Hyperlipidemia Mother   . Heart disease Mother   . Stroke Mother   . Hypertension Mother   . Kidney disease Mother   . Diabetes Mother   . Stroke Maternal Grandmother   . Cancer Maternal Grandfather     prostate  . Stroke Paternal Grandfather     No Known Allergies  Current Outpatient Prescriptions on File Prior to Visit  Medication Sig Dispense Refill  . Calcium Carbonate-Vitamin D (CALCIUM-VITAMIN D) 500-200 MG-UNIT per tablet Take 1 tablet by mouth  2 (two) times daily.    [name] 91-Day (JOLESSA PO) Take by mouth daily.     No current facility-administered medications on file prior to visit.    BP 110/70 mmHg  Pulse 59  Temp(Src) 97.7 F (36.5 C) (Oral)  Ht 5\' 8"  (1.727 m)  Wt 140 lb 1.9 oz (63.558 kg)  BMI 21.31 kg/m2  SpO2 99%    Objective:   Physical Exam  Constitutional: She is oriented to person, place, and time. She appears well-nourished.  HENT:  Right Ear: Tympanic membrane and ear canal normal.  Left Ear: Tympanic membrane and ear canal normal.  Nose: Nose normal.  Mouth/Throat: Oropharynx is clear and moist.  Eyes: Conjunctivae and EOM are normal. Pupils are equal, round,  and reactive to light.  Neck: Neck supple. No thyromegaly present.  Cardiovascular: Normal rate and regular rhythm.   No murmur heard. Pulmonary/Chest: Effort normal and breath sounds normal. She has no rales.  Abdominal: Soft. Bowel sounds are normal. There is no tenderness.  Musculoskeletal: Normal range of motion.  Lymphadenopathy:    She has no cervical adenopathy.  Neurological: She is alert and oriented to person, place, and time. She has normal reflexes. No cranial nerve deficit.  Skin: Skin is warm and dry.  Very mild rash noted to right hand and forearm representative of poison ivy dermatitis.  Psychiatric: She has a normal mood and affect.          Assessment & Plan:

## 2015-08-18 NOTE — Progress Notes (Signed)
Pre visit review using our clinic review tool, if applicable. No additional management support is needed unless otherwise documented below in the visit note. 

## 2015-08-18 NOTE — Assessment & Plan Note (Signed)
Mild cerumen without impaction. Good visualization of the TMs bilaterally.

## 2015-08-18 NOTE — Patient Instructions (Addendum)
Complete lab work prior to leaving today. I will notify you of your results once received.   Work to increase consumption of vegetables, fruits, lean protein, whole grains. Reduce junk food, fatty foods.  Start exercising. You should be getting 1 hour of moderate intensity exercise 5 days weekly.  Ensure you are consuming 64 ounces of water daily.  Follow up in 1 year for repeat physical or sooner if needed.  Apply the Triamcinolone cream twice daily to the rash.  It was a pleasure to meet you today! Please don't hesitate to call me with any questions. Welcome to [facility]!

## 2015-08-18 NOTE — Assessment & Plan Note (Signed)
Vitamin B12 pending today.

## 2015-08-18 NOTE — Assessment & Plan Note (Signed)
Tdap Up-to-date. Pap, mammogram, colonoscopy all up-to-date. Poor diet and does not regularly exercise. Discussed the importance of a healthy diet and regular exercise in order for weight loss and to reduce risk of other medical diseases. Recommended annual eye exam. Labs pending, exam unremarkable except for mild poison ivy dermatitis. Follow-up in one year for repeat physical.

## 2015-09-05 ENCOUNTER — Other Ambulatory Visit: Payer: Self-pay | Admitting: Obstetrics & Gynecology

## 2015-09-05 DIAGNOSIS — Z1231 Encounter for screening mammogram for malignant neoplasm of breast: Secondary | ICD-10-CM

## 2015-10-26 ENCOUNTER — Ambulatory Visit: Payer: BC Managed Care – PPO

## 2016-01-02 ENCOUNTER — Ambulatory Visit: Payer: BC Managed Care – PPO

## 2016-01-04 ENCOUNTER — Ambulatory Visit
Admission: RE | Admit: 2016-01-04 | Discharge: 2016-01-04 | Disposition: A | Discharge status: Home / Self Care | Payer: BC Managed Care – PPO | Source: Ambulatory Visit | Attending: Obstetrics & Gynecology | Admitting: Obstetrics & Gynecology

## 2016-01-04 DIAGNOSIS — Z1231 Encounter for screening mammogram for malignant neoplasm of breast: Secondary | ICD-10-CM | POA: Diagnosis not present

## 2016-01-09 ENCOUNTER — Other Ambulatory Visit: Payer: Self-pay | Admitting: *Deleted

## 2016-01-09 ENCOUNTER — Ambulatory Visit
Admission: RE | Admit: 2016-01-09 | Discharge: 2016-01-09 | Disposition: A | Discharge status: Home / Self Care | Payer: Self-pay | Source: Ambulatory Visit | Attending: *Deleted | Admitting: *Deleted

## 2016-01-09 DIAGNOSIS — Z9289 Personal history of other medical treatment: Secondary | ICD-10-CM

## 2016-04-20 ENCOUNTER — Telehealth: Payer: Self-pay | Admitting: Primary Care

## 2016-04-20 NOTE — Telephone Encounter (Signed)
Pt sister called and stated that pt is having difficulty breathing on execration, tightness in chest. Pt is a [facility] pt. Sent call to team health triage.   Call @ [phone]

## 2016-04-20 NOTE — Telephone Encounter (Signed)
Noted. Patient improved with albuterol inhaler of sisters, will see her on Monday next week as she's feeling much better. She is aware of ED precautions.

## 2016-04-23 ENCOUNTER — Ambulatory Visit: Payer: BC Managed Care – PPO | Admitting: Primary Care

## 2016-04-23 ENCOUNTER — Telehealth: Payer: Self-pay | Admitting: Primary Care

## 2016-04-23 NOTE — Telephone Encounter (Signed)
Patient Name: Margaret Mcfarland DOB: 1971/01/10 Initial Comment Caller states she is having shortness of breath, light headed. The caller is having chest pain. Nurse Assessment Nurse: Ronnald Ramp, RN, Miranda Date/Time (Eastern Time): 04/23/2016 9:21:56 AM Confirm and document reason for call. If symptomatic, describe symptoms. ---Caller states she is having chest pain and SOB at times. She feels lightheaded and like she is going to faint. Does the patient have any new or worsening symptoms? ---Yes Will a triage be completed? ---Yes Related visit to physician within the last 2 weeks? ---No Does the PT have any chronic conditions? (i.e. diabetes, asthma, etc.) ---No Is the patient pregnant or possibly pregnant? (Ask all females between the ages of 67-55) ---No Is this a behavioral health or substance abuse call? ---No Guidelines Guideline Title Affirmed Question Affirmed Notes Breathing Difficulty [1] MILD difficulty breathing (e.g., minimal/no SOB at rest, SOB with walking, pulse <100) AND [2] NEW-onset or WORSE than normal Final Disposition User See Physician within 4 Hours (or PCP triage) Ronnald Ramp, RN, Miranda Comments Call disconnected before speaking to a nurse. Checked Epic and work number listed for pt, [phone]. Called and able to speak with the pt. PT states she has already scheduled an appt with the Cardiologist on Wed and refused appt for today as recommended. Referrals GO TO FACILITY REFUSED Disagree/Comply: Comply

## 2016-04-23 NOTE — Telephone Encounter (Signed)
Left message for patient on her cell to follow up on condition.  I see that she has cancelled her appointment today for evaluation with Alma Friendly, NP and reports having an appointment with cardiology on Wednesday.    I requested patient call us back if needs anything and to confirm that she does not wish to be seen today as we still have some openings at this point where we could see her.

## 2016-04-23 NOTE — Telephone Encounter (Signed)
Noted  

## 2016-05-14 ENCOUNTER — Telehealth: Payer: Self-pay | Admitting: Primary Care

## 2016-05-14 NOTE — Telephone Encounter (Signed)
Pt wants to switch back to [facility] due to location. please advise

## 2016-05-14 NOTE — Telephone Encounter (Signed)
Okay with me, thanks 

## 2016-07-18 ENCOUNTER — Ambulatory Visit (INDEPENDENT_AMBULATORY_CARE_PROVIDER_SITE_OTHER): Payer: BC Managed Care – PPO | Admitting: Family Medicine

## 2016-07-18 ENCOUNTER — Encounter: Payer: Self-pay | Admitting: Family Medicine

## 2016-07-18 DIAGNOSIS — N926 Irregular menstruation, unspecified: Secondary | ICD-10-CM

## 2016-07-18 DIAGNOSIS — F419 Anxiety disorder, unspecified: Secondary | ICD-10-CM | POA: Insufficient documentation

## 2016-07-18 DIAGNOSIS — L237 Allergic contact dermatitis due to plants, except food: Secondary | ICD-10-CM | POA: Diagnosis not present

## 2016-07-18 MED ORDER — TRIAMCINOLONE ACETONIDE 0.1 % EX CREA: 1.0000 "application " | TOPICAL_CREAM | Freq: Two times a day (BID) | CUTANEOUS | 0 refills | Status: DC

## 2016-07-18 MED ORDER — SERTRALINE HCL 50 MG PO TABS: ORAL_TABLET | ORAL | 3 refills | Status: DC

## 2016-07-18 NOTE — Assessment & Plan Note (Signed)
Patient reports she missed her period though she is only 90 days from her last menstrual cycle and she is on continuous birth control. Discussed obtaining a urine pregnancy test though she declined this stating that she's not sexually active and could not be pregnant. She wanted to know if she could be in menopause. She opts to discuss this further when she sees her gynecologist in the next 1-2 months. Advised if she developed recurrent abdominal discomfort being evaluated.

## 2016-07-18 NOTE — Assessment & Plan Note (Addendum)
Patient with recent onset anxiety. Discussed treatment options including therapy and medication. She opted for medication. We'll start her on Zoloft. She'll follow up in 8 weeks. Her episodes of abdominal discomfort seemed to be related to her anxiety. She has a benign abdominal exam today. Discussed continuing to monitor and if this is persistent or recurrent following up.

## 2016-07-18 NOTE — Assessment & Plan Note (Signed)
Rash consistent with allergic contact dermatitis related to likely poison ivy. Limited in  scope. we will treat with topical triamcinolone. She'll continue to monitor.

## 2016-07-18 NOTE — Progress Notes (Signed)
Tommi Rumps, MD Phone: [phone]  Margaret Mcfarland is a 47 y.o. female who presents today for same-day visit.  Rash: Patient notes rash for 5-6 days on her bilateral arms. She thinks she got into poison ivy. It did itch though slightly improved now. She's been using a topical cream on it with little benefit. No new spots. No new soaps or detergents. No fevers.  Anxiety: Patient notes over the last several weeks she's noted things bother her a little bit more. She feels a little more concerned than previously. Sometimes it comes out of nowhere. It is not constant. Notes her mother passed away about a year ago and she wonders if it's related to that. She notes no depression. She has not ever taken medications or seeing a therapist.  Patient notes a missed menstrual cycle. She reports she is on continuous birth control and has a menstrual cycle every 3 months. Her LMP was February 16. She notes she wonders if she is in menopause. She notes occasional hot flashes. She's not ever skipped a period before. She notes she is not sexually active. Occasionally she'll have some stomach discomfort though this typically occurs with anxiety. None at this time. She reports normal bowel movements. No urinary symptoms. No abdominal discomfort currently.  PMH: nonsmoker.   ROS see history of present illness  Objective  Physical Exam Vitals:   07/18/16 1441  BP: 112/80  Pulse: 63  Temp: 98.1 F (36.7 C)    BP Readings from Last 3 Encounters:  07/18/16 112/80  08/18/15 110/70  07/01/15 110/70   Wt Readings from Last 3 Encounters:  07/18/16 138 lb 3.2 oz (62.7 kg)  08/18/15 140 lb 1.9 oz (63.6 kg)  07/01/15 147 lb (66.7 kg)    Physical Exam  Constitutional: No distress.  Cardiovascular: Normal rate, regular rhythm and normal heart sounds.   Pulmonary/Chest: Effort normal and breath sounds normal.  Abdominal: Soft. Bowel sounds are normal. She exhibits no distension. There is no tenderness.  There is no rebound and no guarding.  Neurological: She is alert. Gait normal.  Skin: Skin is warm and dry. She is not diaphoretic.  Patient with linear papulovesicular erythematous rash in right antecubital fossa and over left forearm     Assessment/Plan: Please see individual problem list.  Anxiety Patient with recent onset anxiety. Discussed treatment options including therapy and medication. She opted for medication. We'll start her on Zoloft. She'll follow up in 8 weeks. Her episodes of abdominal discomfort seemed to be related to her anxiety. She has a benign abdominal exam today. Discussed continuing to monitor and if this is persistent or recurrent following up.  Poison ivy dermatitis Rash consistent with allergic contact dermatitis related to likely poison ivy. Limited in  scope. we will treat with topical triamcinolone. She'll continue to monitor.   Missed period Patient reports she missed her period though she is only 90 days from her last menstrual cycle and she is on continuous birth control. Discussed obtaining a urine pregnancy test though she declined this stating that she's not sexually active and could not be pregnant. She wanted to know if she could be in menopause. She opts to discuss this further when she sees her gynecologist in the next 1-2 months. Advised if she developed recurrent abdominal discomfort being evaluated.   No orders of the defined types were placed in this encounter.   Meds ordered this encounter  Medications  . triamcinolone cream (KENALOG) 0.1 %    Sig: Apply 1  application topically 2 (two) times daily.    Dispense:  30 g    Refill:  0  . sertraline (ZOLOFT) 50 MG tablet    Sig: Take 25 mg (half a tablet) by mouth daily for 7 days, then increase to 50 mg (one tablet) by mouth daily    Dispense:  30 tablet    Refill:  [place], MD [place]

## 2016-07-18 NOTE — Patient Instructions (Signed)
Nice to see you. We will start you on Zoloft for your anxiety. We will treat your rash as poison ivy with triamcinolone. Please see your gynecologist to discuss your menstrual cycles as already scheduled. If you develop abdominal discomfort please be evaluated.

## 2016-08-27 ENCOUNTER — Telehealth: Payer: Self-pay | Admitting: Primary Care

## 2016-08-27 ENCOUNTER — Telehealth: Payer: Self-pay | Admitting: Family Medicine

## 2016-08-27 NOTE — Telephone Encounter (Signed)
Zoloft can certainly cause diarrhea. Please see how long she has been having the diarrhea. Please see how her anxiety is doing. I would suggest follow-up with either myself or her current PCPs office.

## 2016-08-27 NOTE — Telephone Encounter (Signed)
Please advise 

## 2016-08-27 NOTE — Telephone Encounter (Signed)
Pt called and has a question pt is a pt at [facility]. Pt wants to come here due our office being closer. Pt has a question for Dr Caryl Bis. sertraline (ZOLOFT) 50 MG tablet pt is on and is having diarrhea. Pt would like to know what to do or does she need to contact her office at [place] for now? Until she switches over? Please advise?  Call pt @ [phone]. Thank you!

## 2016-08-27 NOTE — Telephone Encounter (Signed)
Patient is scheduled   

## 2016-08-27 NOTE — Telephone Encounter (Signed)
Patient notified and states she would like to schedule appmt with Dr.Sonnenberg, 30 min appmt only. Patients phone was disconnected during scheduling. Patient states it has helped with anxiety.

## 2016-08-31 ENCOUNTER — Encounter: Payer: Self-pay | Admitting: Family Medicine

## 2016-08-31 ENCOUNTER — Ambulatory Visit (INDEPENDENT_AMBULATORY_CARE_PROVIDER_SITE_OTHER): Payer: BC Managed Care – PPO | Admitting: Family Medicine

## 2016-08-31 DIAGNOSIS — F419 Anxiety disorder, unspecified: Secondary | ICD-10-CM | POA: Diagnosis not present

## 2016-08-31 MED ORDER — ESCITALOPRAM OXALATE 10 MG PO TABS: 10.0000 mg | ORAL_TABLET | Freq: Every day | ORAL | 3 refills | Status: DC

## 2016-08-31 MED ORDER — SERTRALINE HCL 50 MG PO TABS: ORAL_TABLET | ORAL | 0 refills | Status: DC

## 2016-08-31 NOTE — Patient Instructions (Signed)
Nice to see you. Your diarrhea is likely related to the Zoloft. We will have you take 25 mg (half a tablet) by mouth daily for 7 days and then discontinue. Please stay off of this for one week and then you can start on Lexapro. If your diarrhea does not improve please let us know.

## 2016-08-31 NOTE — Telephone Encounter (Signed)
Please advise if you will take pt as a patient.

## 2016-08-31 NOTE — Telephone Encounter (Signed)
Please advise 

## 2016-08-31 NOTE — Assessment & Plan Note (Signed)
Suspect patient's diarrhea is from the sertraline. We'll taper her off this. After she's been off of it for a week she'll start on Lexapro and we'll see if she can tolerate this better. If she has recurrence of diarrhea or does not resolve with tapering off she'll let us know.

## 2016-08-31 NOTE — Progress Notes (Signed)
  Tommi Rumps, MD Phone: [phone]  Margaret Mcfarland is a 46 y.o. female who presents today for follow-up.  Patient reports her anxiety is quite a bit better on the Zoloft. She's been on it for about a month and a half. She notes no anxiety at all. No depression. She does note about 2 weeks ago she developed onset of diarrhea. Mostly liquid stools. They're dark brown. No melena. No blood in her stool. No abdominal pain. No nausea or vomiting.   ROS see history of present illness  Objective  Physical Exam Vitals:   08/31/16 0832  BP: 118/70  Pulse: 82  Temp: 98 F (36.7 C)    BP Readings from Last 3 Encounters:  08/31/16 118/70  07/18/16 112/80  08/18/15 110/70   Wt Readings from Last 3 Encounters:  08/31/16 139 lb 6.4 oz (63.2 kg)  07/18/16 138 lb 3.2 oz (62.7 kg)  08/18/15 140 lb 1.9 oz (63.6 kg)    Physical Exam  Constitutional: No distress.  Cardiovascular: Normal rate, regular rhythm and normal heart sounds.   Pulmonary/Chest: Effort normal and breath sounds normal.  Abdominal: Soft. Bowel sounds are normal. She exhibits no distension. There is no tenderness. There is no rebound and no guarding.  Neurological: She is alert. Gait normal.  Skin: Skin is warm and dry. She is not diaphoretic.     Assessment/Plan: Please see individual problem list.  Anxiety Suspect patient's diarrhea is from the sertraline. We'll taper her off this. After she's been off of it for a week she'll start on Lexapro and we'll see if she can tolerate this better. If she has recurrence of diarrhea or does not resolve with tapering off she'll let us know.   No orders of the defined types were placed in this encounter.   Meds ordered this encounter  Medications  . escitalopram (LEXAPRO) 10 MG tablet    Sig: Take 1 tablet (10 mg total) by mouth daily.    Dispense:  30 tablet    Refill:  3  . sertraline (ZOLOFT) 50 MG tablet    Sig: Take 25 mg (half a tablet) by mouth daily for 7 days,  then discontinue    Dispense:  7 tablet    Refill:  0   Tommi Rumps, MD [place]

## 2016-09-01 NOTE — Telephone Encounter (Signed)
Okay with me 

## 2016-09-14 ENCOUNTER — Ambulatory Visit: Payer: BC Managed Care – PPO | Admitting: Family Medicine

## 2016-10-15 ENCOUNTER — Ambulatory Visit: Payer: BC Managed Care – PPO | Admitting: Family

## 2016-10-16 ENCOUNTER — Ambulatory Visit (INDEPENDENT_AMBULATORY_CARE_PROVIDER_SITE_OTHER): Payer: BC Managed Care – PPO

## 2016-10-16 ENCOUNTER — Ambulatory Visit (INDEPENDENT_AMBULATORY_CARE_PROVIDER_SITE_OTHER): Payer: BC Managed Care – PPO | Admitting: Family

## 2016-10-16 ENCOUNTER — Encounter: Payer: Self-pay | Admitting: Family

## 2016-10-16 VITALS — BP 100/66 | HR 67 | Temp 97.6°F | Ht 68.0 in | Wt 140.6 lb

## 2016-10-16 DIAGNOSIS — Z0000 Encounter for general adult medical examination without abnormal findings: Secondary | ICD-10-CM | POA: Diagnosis not present

## 2016-10-16 DIAGNOSIS — R079 Chest pain, unspecified: Secondary | ICD-10-CM | POA: Insufficient documentation

## 2016-10-16 DIAGNOSIS — F419 Anxiety disorder, unspecified: Secondary | ICD-10-CM

## 2016-10-16 DIAGNOSIS — M545 Low back pain, unspecified: Secondary | ICD-10-CM

## 2016-10-16 DIAGNOSIS — G8929 Other chronic pain: Secondary | ICD-10-CM

## 2016-10-16 NOTE — Progress Notes (Signed)
Subjective:    Patient ID: Margaret Mcfarland, female    DOB: 20-Dec-1970, 46 y.o.   MRN: [id number]  CC: Margaret Mcfarland is a 46 y.o. female who presents today for follow up.   HPI: Left Low back pain for over a year, little worse, intermittent. Describes as an ache.   Thinks it's leaning in car.   NO falls, injury, dysuria, numbness, tingling.  Improves with better posture and 'not leaning to left' in car.  No h/o cancer.  GAD- lexapro works well. No depression. No thoughts of hurting herself or anyone else.  Follows [facility] for mammogram, Pap smear.   Early colonoscopy due to father's history, repeats every 3 years.  Patient describes history of chest pain. This is been intermittent for the past several months, unchanged. She has been seen by cardiac allergy 4 months ago for this. She reports that she still "feels it" from time to time. A chest pain today, shortness of breath, left arm numbness tingling, palpitations, headache.    06/2016 Nehem[facility] Massed -evaluated for chest pain. Echocardiogram shows LVEF > 55%, mild MR, mild TR. Normal stress test ( unable to see results in Epic, per patient done 2017). Nehemiah Massed states no further cardiac evaluation at this time.      HISTORY:  Past Medical History:  Diagnosis Date  . Blood in stool   . Chicken pox   . Genital warts    Past Surgical History:  Procedure Laterality Date  . COLONOSCOPY N/A 08/25/2014   Procedure: COLONOSCOPY;  Surgeon: Manya Silvas, MD;  Location: [facility] ENDOSCOPY;  Service: Endoscopy;  Laterality: N/A;  . endoscopy    . GUM SURGERY  2008  . laproscopy     Family History  Problem Relation Age of Onset  . Colon cancer Father 44  . Arthritis Father   . Cancer Father        colon/prostate  . Arthritis Mother   . Hyperlipidemia Mother   . Heart disease Mother   . Stroke Mother   . Hypertension Mother   . Kidney disease Mother   . Diabetes Mother   . Stroke Maternal Grandmother   . Cancer Maternal Grandfather          prostate  . Stroke Paternal Grandfather     Allergies: Sertraline Current Outpatient Prescriptions on File Prior to Visit  Medication Sig Dispense Refill  . escitalopram (LEXAPRO) 10 MG tablet Take 1 tablet (10 mg total) by mouth daily. 30 tablet 3  . Levonorgest-Eth Estrad 91-Day (JOLESSA PO) Take by mouth daily.    Marland Kitchen triamcinolone cream (KENALOG) 0.1 % Apply 1 application topically 2 (two) times daily. 30 g 0   No current facility-administered medications on file prior to visit.     Social History  Substance Use Topics  . Smoking status: Never Smoker  . Smokeless tobacco: Never Used  . Alcohol use No    Review of Systems  Constitutional: Negative for chills and fever.  Eyes: Negative for visual disturbance.  Respiratory: Negative for cough and shortness of breath.   Cardiovascular: Positive for chest pain (chronic). Negative for palpitations.  Gastrointestinal: Negative for nausea and vomiting.  Musculoskeletal: Positive for back pain.  Neurological: Negative for weakness, numbness and headaches.  Psychiatric/Behavioral: The patient is nervous/anxious.       Objective:    BP 100/66   Pulse 67   Temp 97.6 F (36.4 C) (Oral)   Ht 5\' 8"  (1.727 m)   Wt 140 lb 9.6 oz (  63.8 kg)   SpO2 94%   BMI 21.38 kg/m  BP Readings from Last 3 Encounters:  10/16/16 100/66  08/31/16 118/70  07/18/16 112/80   Wt Readings from Last 3 Encounters:  10/16/16 140 lb 9.6 oz (63.8 kg)  08/31/16 139 lb 6.4 oz (63.2 kg)  07/18/16 138 lb 3.2 oz (62.7 kg)    Physical Exam  Constitutional: She appears well-developed and well-nourished.  Eyes: Conjunctivae are normal.  Cardiovascular: Normal rate, regular rhythm, normal heart sounds and normal pulses.   Pulmonary/Chest: Effort normal and breath sounds normal. She has no wheezes. She has no rhonchi. She has no rales.  Musculoskeletal:       Lumbar back: She exhibits normal range of motion, no tenderness, no bony tenderness, no  swelling, no edema, no pain and no spasm.  Full range of motion with flexion, tension, lateral side bends. No bony tenderness. No pain, numbness, tingling elicited with single leg raise bilaterally.   Neurological: She is alert. She has normal strength. No sensory deficit.  Reflex Scores:      Patellar reflexes are 2+ on the right side and 2+ on the left side. Sensation and strength intact bilateral lower extremities.  Skin: Skin is warm and dry.  Psychiatric: She has a normal mood and affect. Her speech is normal and behavior is normal. Thought content normal.  Vitals reviewed.      Assessment & Plan:   Problem List Items Addressed This Visit      Other   Anxiety    Doing well on Lexapro. Will continue current regimen       Encounter for medical examination to establish care    Reviewed history with patient. Screening labs ordered.      Relevant Orders   VITAMIN D 25 Hydroxy (Vit-D Deficiency, Fractures)   TSH   Lipid panel   Hemoglobin A1c   CBC with Differential/Platelet   Comprehensive metabolic panel   Chronic left-sided low back pain without sciatica    Symptoms most consistent DDD  And/or muscle strain. Advised conservative therapy. Since has been off and on for the past year and a half, we jointly agreed to pursue an x-ray at this time. Return precautions given.      Relevant Orders   DG Lumbar Spine Complete (Completed)   Chest pain    Chronic. Denies any new or worsening symptoms. Patient declines any further cardiac testing as this time. Advised her to continue follow-up with cardiology chest pain recurs, changes in any way.           I have discontinued Ms. Creque's sertraline. I am also having her maintain her Levonorgest-Eth Estrad 91-Day (JOLESSA PO), triamcinolone cream, and escitalopram.   No orders of the defined types were placed in this encounter.   Return precautions given.   Risks, benefits, and alternatives of the medications and treatment  plan prescribed today were discussed, and patient expressed understanding.   Education regarding symptom management and diagnosis given to patient on AVS.  Continue to follow with Burnard Hawthorne, FNP for routine health maintenance.   Godfrey Pick and I agreed with plan.   Mable Paris, FNP

## 2016-10-16 NOTE — Assessment & Plan Note (Addendum)
Doing well on Lexapro. Will continue current regimen

## 2016-10-16 NOTE — Assessment & Plan Note (Signed)
Symptoms most consistent DDD  And/or muscle strain. Advised conservative therapy. Since has been off and on for the past year and a half, we jointly agreed to pursue an x-ray at this time. Return precautions given.

## 2016-10-16 NOTE — Patient Instructions (Signed)
Fasting labs when convenient X-ray today  Let's treat conservatively as we discussed.   Over-the-counter medications you may try for arthritic pain include:   ThermaCare patches   Capsaicin cream   Icy hot  If conservative treatment doesn't yield results, we will consider physical therapy, consult to Sports Medicine/Orthopedics for further evaluation, and imaging.   If there is no improvement in your symptoms, or if there is any worsening of symptoms, or if you have any additional concerns, please return for re-evaluation; or, if we are closed, consider going to the Emergency Room for evaluation if symptoms urgent.  Low Back Sprain Rehab Ask your health care provider which exercises are safe for you. Do exercises exactly as told by your health care provider and adjust them as directed. It is normal to feel mild stretching, pulling, tightness, or discomfort as you do these exercises, but you should stop right away if you feel sudden pain or your pain gets worse. Do not begin these exercises until told by your health care provider. Stretching and range of motion exercises These exercises warm up your muscles and joints and improve the movement and flexibility of your back. These exercises also help to relieve pain, numbness, and tingling. Exercise A: Lumbar rotation  1. Lie on your back on a firm surface and bend your knees. 2. Straighten your arms out to your sides so each arm forms an "L" shape with a side of your body (a 90 degree angle). 3. Slowly move both of your knees to one side of your body until you feel a stretch in your lower back. Try not to let your shoulders move off of the floor. 4. Hold for __________ seconds. 5. Tense your abdominal muscles and slowly move your knees back to the starting position. 6. Repeat this exercise on the other side of your body. Repeat __________ times. Complete this exercise __________ times a day. Exercise B: Prone extension on elbows  1. Lie on  your abdomen on a firm surface. 2. Prop yourself up on your elbows. 3. Use your arms to help lift your chest up until you feel a gentle stretch in your abdomen and your lower back. ? This will place some of your body weight on your elbows. If this is uncomfortable, try stacking pillows under your chest. ? Your hips should stay down, against the surface that you are lying on. Keep your hip and back muscles relaxed. 4. Hold for __________ seconds. 5. Slowly relax your upper body and return to the starting position. Repeat __________ times. Complete this exercise __________ times a day. Strengthening exercises These exercises build strength and endurance in your back. Endurance is the ability to use your muscles for a long time, even after they get tired. Exercise C: Pelvic tilt 1. Lie on your back on a firm surface. Bend your knees and keep your feet flat. 2. Tense your abdominal muscles. Tip your pelvis up toward the ceiling and flatten your lower back into the floor. ? To help with this exercise, you may place a small towel under your lower back and try to push your back into the towel. 3. Hold for __________ seconds. 4. Let your muscles relax completely before you repeat this exercise. Repeat __________ times. Complete this exercise __________ times a day. Exercise D: Alternating arm and leg raises  1. Get on your hands and knees on a firm surface. If you are on a hard floor, you may want to use padding to cushion your knees,  such as an exercise mat. 2. Line up your arms and legs. Your hands should be below your shoulders, and your knees should be below your hips. 3. Lift your left leg behind you. At the same time, raise your right arm and straighten it in front of you. ? Do not lift your leg higher than your hip. ? Do not lift your arm higher than your shoulder. ? Keep your abdominal and back muscles tight. ? Keep your hips facing the ground. ? Do not arch your back. ? Keep your balance  carefully, and do not hold your breath. 4. Hold for __________ seconds. 5. Slowly return to the starting position and repeat with your right leg and your left arm. Repeat __________ times. Complete this exercise __________ times a day. Exercise E: Abdominal set with straight leg raise  1. Lie on your back on a firm surface. 2. Bend one of your knees and keep your other leg straight. 3. Tense your abdominal muscles and lift your straight leg up, 4-6 inches (10-15 cm) off the ground. 4. Keep your abdominal muscles tight and hold for __________ seconds. ? Do not hold your breath. ? Do not arch your back. Keep it flat against the ground. 5. Keep your abdominal muscles tense as you slowly lower your leg back to the starting position. 6. Repeat with your other leg. Repeat __________ times. Complete this exercise __________ times a day. Posture and body mechanics  Body mechanics refers to the movements and positions of your body while you do your daily activities. Posture is part of body mechanics. Good posture and healthy body mechanics can help to relieve stress in your body's tissues and joints. Good posture means that your spine is in its natural S-curve position (your spine is neutral), your shoulders are pulled back slightly, and your head is not tipped forward. The following are general guidelines for applying improved posture and body mechanics to your everyday activities. Standing   When standing, keep your spine neutral and your feet about hip-width apart. Keep a slight bend in your knees. Your ears, shoulders, and hips should line up.  When you do a task in which you stand in one place for a long time, place one foot up on a stable object that is 2-4 inches (5-10 cm) high, such as a footstool. This helps keep your spine neutral. Sitting   When sitting, keep your spine neutral and keep your feet flat on the floor. Use a footrest, if necessary, and keep your thighs parallel to the floor.  Avoid rounding your shoulders, and avoid tilting your head forward.  When working at a desk or a computer, keep your desk at a height where your hands are slightly lower than your elbows. Slide your chair under your desk so you are close enough to maintain good posture.  When working at a computer, place your monitor at a height where you are looking straight ahead and you do not have to tilt your head forward or downward to look at the screen. Resting   When lying down and resting, avoid positions that are most painful for you.  If you have pain with activities such as sitting, bending, stooping, or squatting (flexion-based activities), lie in a position in which your body does not bend very much. For example, avoid curling up on your side with your arms and knees near your chest (fetal position).  If you have pain with activities such as standing for a long time or reaching  with your arms (extension-based activities), lie with your spine in a neutral position and bend your knees slightly. Try the following positions:  Lying on your side with a pillow between your knees.  Lying on your back with a pillow under your knees. Lifting   When lifting objects, keep your feet at least shoulder-width apart and tighten your abdominal muscles.  Bend your knees and hips and keep your spine neutral. It is important to lift using the strength of your legs, not your back. Do not lock your knees straight out.  Always ask for help to lift heavy or awkward objects. This information is not intended to replace advice given to you by your health care provider. Make sure you discuss any questions you have with your health care provider. Document Released: 02/19/2005 Document Revised: 10/27/2015 Document Reviewed: 12/01/2014 Elsevier Interactive Patient Education  [employer].

## 2016-10-16 NOTE — Progress Notes (Signed)
Pre visit review using our clinic review tool, if applicable. No additional management support is needed unless otherwise documented below in the visit note. 

## 2016-10-17 ENCOUNTER — Other Ambulatory Visit (INDEPENDENT_AMBULATORY_CARE_PROVIDER_SITE_OTHER): Payer: BC Managed Care – PPO

## 2016-10-17 DIAGNOSIS — Z0000 Encounter for general adult medical examination without abnormal findings: Secondary | ICD-10-CM

## 2016-10-17 LAB — CBC WITH DIFFERENTIAL/PLATELET
BASOS PCT: 0.5 % (ref 0.0–3.0)
Basophils Absolute: 0 10*3/uL (ref 0.0–0.1)
EOS PCT: 2.6 % (ref 0.0–5.0)
Eosinophils Absolute: 0.1 10*3/uL (ref 0.0–0.7)
HCT: 44.2 % (ref 36.0–46.0)
Hemoglobin: 14.4 g/dL (ref 12.0–15.0)
LYMPHS ABS: 1.3 10*3/uL (ref 0.7–4.0)
Lymphocytes Relative: 30.3 % (ref 12.0–46.0)
MCHC: 32.6 g/dL (ref 30.0–36.0)
MCV: 94 fl (ref 78.0–100.0)
MONO ABS: 0.4 10*3/uL (ref 0.1–1.0)
Monocytes Relative: 9.2 % (ref 3.0–12.0)
NEUTROS PCT: 57.4 % (ref 43.0–77.0)
Neutro Abs: 2.5 10*3/uL (ref 1.4–7.7)
Platelets: 254 10*3/uL (ref 150.0–400.0)
RBC: 4.7 Mil/uL (ref 3.87–5.11)
RDW: 13 % (ref 11.5–15.5)
WBC: 4.4 10*3/uL (ref 4.0–10.5)

## 2016-10-17 LAB — LIPID PANEL
Cholesterol: 181 mg/dL (ref 0–200)
HDL: 54.8 mg/dL (ref >39.00)
LDL CALC: 116 mg/dL — AB (ref 0–99)
NonHDL: 126.44
TRIGLYCERIDES: 53 mg/dL (ref 0.0–149.0)
Total CHOL/HDL Ratio: 3
VLDL: 10.6 mg/dL (ref 0.0–40.0)

## 2016-10-17 LAB — COMPREHENSIVE METABOLIC PANEL
ALK PHOS: 36 U/L — AB (ref 39–117)
ALT: 11 U/L (ref 0–35)
AST: 13 U/L (ref 0–37)
Albumin: 4.3 g/dL (ref 3.5–5.2)
BUN: 13 mg/dL (ref 6–23)
CHLORIDE: 104 meq/L (ref 96–112)
CO2: 27 mEq/L (ref 19–32)
CREATININE: 0.74 mg/dL (ref 0.40–1.20)
Calcium: 9.3 mg/dL (ref 8.4–10.5)
GFR: 88.94 mL/min (ref >60.00)
GLUCOSE: 82 mg/dL (ref 70–99)
POTASSIUM: 4.1 meq/L (ref 3.5–5.1)
Sodium: 137 mEq/L (ref 135–145)
Total Bilirubin: 0.7 mg/dL (ref 0.2–1.2)
Total Protein: 6.7 g/dL (ref 6.0–8.3)

## 2016-10-17 LAB — VITAMIN D 25 HYDROXY (VIT D DEFICIENCY, FRACTURES): VITD: 49.36 ng/mL (ref 30.00–100.00)

## 2016-10-17 LAB — TSH: TSH: 1.4 u[IU]/mL (ref 0.35–4.50)

## 2016-10-17 LAB — HEMOGLOBIN A1C: HEMOGLOBIN A1C: 5.5 % (ref 4.6–6.5)

## 2016-10-17 NOTE — Assessment & Plan Note (Addendum)
  Chronic. Denies any new or worsening symptoms. Patient declines any further cardiac testing as this time. Advised her to continue follow-up with cardiology chest pain recurs, changes in any way.

## 2016-10-17 NOTE — Assessment & Plan Note (Signed)
Reviewed history with patient. Screening labs ordered.

## 2016-10-18 ENCOUNTER — Telehealth: Payer: Self-pay | Admitting: *Deleted

## 2016-10-18 NOTE — Telephone Encounter (Signed)
See result note.  

## 2016-10-18 NOTE — Telephone Encounter (Signed)
Pt has requested lab results  Pt contact [phone]

## 2016-10-18 NOTE — Telephone Encounter (Signed)
Please call Pt [phone] , please call around 9:30 or 10:00 am  Pt would like change to a different medication other than Lexapro

## 2016-10-19 NOTE — Telephone Encounter (Signed)
See result note.  

## 2016-10-19 NOTE — Progress Notes (Signed)
Patient would like to switch from lexapro to another medication please advise.

## 2016-10-22 NOTE — Telephone Encounter (Signed)
Sorry for any confusion. We didn't discuss changing her lexapro.   She needs an appt to discuss   Thanks

## 2016-10-22 NOTE — Telephone Encounter (Signed)
Pt has requested a call to discuss change in medications  Pt contact [phone]

## 2016-10-22 NOTE — Telephone Encounter (Signed)
Please advise, thanks.

## 2016-10-23 NOTE — Telephone Encounter (Signed)
Left message to schedule appt

## 2016-10-23 NOTE — Telephone Encounter (Signed)
Patient needs an appt to make changes, thanks

## 2016-10-31 ENCOUNTER — Ambulatory Visit: Payer: BC Managed Care – PPO | Admitting: Family Medicine

## 2016-12-03 ENCOUNTER — Other Ambulatory Visit: Payer: Self-pay | Admitting: Family

## 2016-12-03 DIAGNOSIS — Z1231 Encounter for screening mammogram for malignant neoplasm of breast: Secondary | ICD-10-CM

## 2016-12-18 ENCOUNTER — Telehealth: Payer: Self-pay | Admitting: Family

## 2016-12-18 DIAGNOSIS — G8929 Other chronic pain: Secondary | ICD-10-CM

## 2016-12-18 DIAGNOSIS — M545 Low back pain: Principal | ICD-10-CM

## 2016-12-18 NOTE — Telephone Encounter (Signed)
Left message for patient to return call back. Patient needs appointment. 

## 2016-12-18 NOTE — Telephone Encounter (Signed)
Pt called about having pain in her lower back near tail bone on left side. Pt wants to know does she need to come in or can something be called in for pain? Please advise?  Call pt @ [phone]. Thank you!

## 2016-12-19 NOTE — Telephone Encounter (Signed)
Pt states that she has already been seen regarding her back pain so she does not know why she will need to be seen again. Please cb pt to discuss. She is aware Fransisco Beau is not in office.

## 2016-12-20 MED ORDER — LIDOCAINE 5 % EX PTCH: 1.0000 | MEDICATED_PATCH | CUTANEOUS | 0 refills | Status: DC

## 2016-12-20 NOTE — Telephone Encounter (Signed)
Please advise 

## 2016-12-20 NOTE — Telephone Encounter (Signed)
At this pt- I think she would benefit from PT I also sent in pain patches.   Please let pt know and let us know if these steps dont help

## 2016-12-20 NOTE — Telephone Encounter (Signed)
Kleigh has been informed and she had no questions, comments or concerns at this time.

## 2016-12-20 NOTE — Telephone Encounter (Signed)
Left message for patient to return call back.  

## 2017-01-02 ENCOUNTER — Ambulatory Visit: Payer: BC Managed Care – PPO | Attending: Family

## 2017-01-02 DIAGNOSIS — G8929 Other chronic pain: Secondary | ICD-10-CM

## 2017-01-02 DIAGNOSIS — R293 Abnormal posture: Secondary | ICD-10-CM | POA: Insufficient documentation

## 2017-01-02 DIAGNOSIS — M545 Low back pain: Secondary | ICD-10-CM | POA: Insufficient documentation

## 2017-01-02 NOTE — Therapy (Signed)
[facility] [address] [place], [address] Phone: [phone]   Fax:  [phone]  Physical Therapy Evaluation  Patient Details  Name: Margaret Mcfarland MRN: [id number] Date of Birth: 06/02/1970 Referring Provider: Mable Paris, FNP  Encounter Date: 01/02/2017      PT End of Session - 01/02/17 1445    Visit Number 1   Number of Visits 11   Date for PT Re-Evaluation 02/07/17   PT Start Time 0867   PT Stop Time 1613   PT Time Calculation (min) 88 min   Activity Tolerance Patient tolerated treatment well   Behavior During Therapy [facility] for tasks assessed/performed      Past Medical History:  Diagnosis Date  . Anxiety   . Blood in stool   . Chicken pox   . Genital warts   . Kidney disease    per pt medical screening form    Past Surgical History:  Procedure Laterality Date  . COLONOSCOPY N/A 08/25/2014   Procedure: COLONOSCOPY;  Surgeon: Manya Silvas, MD;  Location: [facility] ENDOSCOPY;  Service: Endoscopy;  Laterality: N/A;  . endoscopy    . GUM SURGERY  2008  . laproscopy      There were no vitals filed for this visit.       Subjective Assessment - 01/02/17 1453    Subjective L Low back ( paraspinal to L posterior lateral hip): 2-3/10 currently (pt sitting), 8/10 at worst for the past 2 months.  Sometimes feels pain in her R lumbar paraspinals as well: 0/10 currently (pt siting) 2/10 at most for the past 2 months... feels like nerves in her back.    Pertinent History Chronic L low back pain since 2 years (started [facility] 2016). Pain began gradually. Pt was at the doctor's office for blood work, was asked if she had pain by the nurse, pt mentioned to her about her back, MD told her to try exercises (standing trunk rotation, standing forward flexion) for 2 months which did not help. Called her MD back and was sent to PT.  Denies LE paresthesia. Pt states feeling pain L anterior lateral hip to  posterior hip about 2 weeks ago. Does not know what caused it and has not had that before. Denies problems with bowel or bladder function.  Walking does not bother her back much at all.    Patient Stated Goals Pt expresses desire to get better.    Currently in Pain? Yes   Pain Score 3    Pain Location Back   Pain Orientation Lower;Right;Left;Posterior   Pain Type Chronic pain   Pain Onset More than a month ago   Pain Frequency Occasional   Aggravating Factors  Rocking back and forth. Not sure of lying down on her back bothers her.    Pain Relieving Factors Does not know what makes her back feel better.             [facility] PT Assessment - 01/02/17 1507      Assessment   Medical Diagnosis Chronic L sided low back pain without sciatica   Referring Provider Mable Paris, FNP   Onset Date/Surgical Date --  December 2016   Prior Therapy No known PT for current condition     Precautions   Precaution Comments No known precautions     Restrictions   Other Position/Activity Restrictions No known restrictions     Balance Screen   Has the patient fallen in the  past 6 months No   Has the patient had a decrease in activity level because of a fear of falling?  No   Is the patient reluctant to leave their home because of a fear of falling?  No     Home Environment   Additional Comments Pt lives in a 1 story home with her sister. 3-4 steps to enter with R rail      Prior Function   Vocation Full time employment   Leisure shop     Observation/Other Assessments   Observations (-) repeated flexion test; (-) Slump bilateral LE. (-) kidney thump test bilateral sides. Increased L low back pain with increased time in the prone position.  (+) Long sit test suggetsing anterior nutation of L innominate.    Modified Oswertry 6%     Posture/Postural Control   Posture Comments R shoulder lower, protracted neck, slight R trunk side bend, slight L lateral shift (L low back side bend, R side bend  around mid thoracic area), slight R thoracic rotation     AROM   Overall AROM Comments Prone press-up position: decreased thoracic extension, bilateral ASIS lifts off table    Lumbar Flexion WFL  no pain with over pressure   Lumbar Extension full  No pain with overpressure   Lumbar - Right Side Bend WFL  No pain with overpressure   Lumbar - Left Side Bend WFL  No pain with overpressure   Lumbar - Right Rotation full  No pain with overpressure   Lumbar - Left Rotation WFL  No pain with overpressure     Strength   Right Hip Flexion 4/5   Right Hip Extension 4-/5   Right Hip ABduction 5/5   Left Hip Flexion 4/5   Left Hip Extension 4-/5   Left Hip ABduction 5/5   Right Knee Flexion 5/5   Right Knee Extension 5/5   Left Knee Flexion 5/5   Left Knee Extension 5/5   Right Ankle Dorsiflexion 5/5   Right Ankle Plantar Flexion 5/5  manually resisted   Left Ankle Dorsiflexion 4+/5   Left Ankle Plantar Flexion 5/5  manually resisted     Palpation   Palpation comment increased R lumbar paraspinal muscle tension compared to L; Slight stiffness with R UPA to L5, L4, L3, L2, L1 transverse processes, no pain. Slight stiffness with central P to A to T11 to T6, and around T3 to T1.  L ASIS slightly inferior to R     Ambulation/Gait   Gait Comments lateral lean during stance phase            Objective measurements completed on examination: See above findings.   Pt states not having osteopenia, was checked when 46 years old     Prone position 2/10 L low back pain   Objectives  There-ex   Supine L hip extension isometrics in end range L hip flexion (single knee to chest) with PT    5x5 seconds x 2. Slight decrease in L low back symptoms initially but pain returned.    Seated L hip extension isometrics 10x2 with 5 second holds     Reviewed and given as part of her HEP. Pt demonstrated and verbalized understanding.   Improved exercise technique, movement at target joints,  use of target muscles after mod verbal, visual, tactile cues.          PT Education - 01/02/17 1851    Education provided Yes   Education Details ther-ex, HEP, plan  of care   Person(s) Educated Patient;Other (comment)  sister present   Methods Explanation;Demonstration;Tactile cues;Verbal cues;Handout   Comprehension Verbalized understanding;Returned demonstration             PT Long Term Goals - 01/02/17 1626      PT LONG TERM GOAL #1   Title Patient will have a decrease in L low back pain to 5/10 or less at worst and R low back pain to 0/10 at most to promote ability to perform functional tasks.    Baseline 8/10 L low back, 2/10 R low back pain at most (01/02/2017)   Time 5   Period Weeks   Status New   Target Date 02/07/17     PT LONG TERM GOAL #2   Title Patient will improve bilateral glute max strength by at least 1/2 MMT to promote ability to perform functional tasks with less back pain.    Time 5   Period Weeks   Status New   Target Date 02/07/17     PT LONG TERM GOAL #3   Title Patient will improve her Modified Oswestry Low Back Pain disability questionnaire score to 0% as a demonstration of improved function.    Baseline 6% (01/02/2017)   Time 5   Period Weeks   Status New   Target Date 02/07/17                Plan - 01/02/17 1614    Clinical Impression Statement Patient is a 46 year old female who came to physical therapy secondary to chronic low back pain L > R. She also presents with altered posture, bilateral glute max and hip flexor weakness, slight thoracic and R low back stiffness, positive special test suggesting lumbopelvic involvement. Patient will benefit from skilled physical therapy services to address the aforementioned deficits.    History and Personal Factors relevant to plan of care: Chronic condition   Clinical Presentation Stable   Clinical Presentation due to: Pain is the same per pt in medical screening form   Clinical  Decision Making Low   Rehab Potential Good   Clinical Impairments Affecting Rehab Potential age, good family support (sister)   PT Frequency 2x / week   PT Duration Other (comment)  5 weeks   PT Treatment/Interventions Therapeutic activities;Therapeutic exercise;Manual techniques;Aquatic Therapy;Electrical Stimulation;Iontophoresis 4mg /ml Dexamethasone;Traction;Ultrasound;Neuromuscular re-education;Patient/family education;Dry needling   PT Next Visit Plan glute strengthening, posture, lumbar and thoracic mobility   Consulted and Agree with Plan of Care Patient      Patient will benefit from skilled therapeutic intervention in order to improve the following deficits and impairments:  Pain, Postural dysfunction, Improper body mechanics  Visit Diagnosis: Chronic bilateral low back pain, with sciatica presence unspecified - Plan: PT plan of care cert/re-cert  Abnormal posture - Plan: PT plan of care cert/re-cert     Problem List Patient Active Problem List   Diagnosis Date Noted  . Encounter for medical examination to establish care 10/16/2016  . Chronic left-sided low back pain without sciatica 10/16/2016  . Chest pain 10/16/2016  . Anxiety 07/18/2016  . Poison ivy dermatitis 07/18/2016  . Missed period 07/18/2016  . Purpura ([place]) 07/06/2015  . B12 deficiency 08/16/2014    Joneen Boers PT, DPT   01/02/2017, 6:58 PM  [facility] [address] [place], [address] Phone: [phone]   Fax:  [phone]  Name: Denia Mcvicar MRN: [id number] Date of Birth: 07/06/1970

## 2017-01-02 NOTE — Patient Instructions (Signed)
   Sitting on your chair   Squeeze your rear end muscles together and press your left foot onto the floor for 5 seconds.    Repeat 10 times   Perform 3 sets daily.

## 2017-01-07 ENCOUNTER — Ambulatory Visit: Payer: BC Managed Care – PPO

## 2017-01-07 ENCOUNTER — Ambulatory Visit: Payer: BC Managed Care – PPO | Attending: Family

## 2017-01-07 DIAGNOSIS — G8929 Other chronic pain: Secondary | ICD-10-CM

## 2017-01-07 DIAGNOSIS — M545 Low back pain: Secondary | ICD-10-CM | POA: Diagnosis present

## 2017-01-07 DIAGNOSIS — R293 Abnormal posture: Secondary | ICD-10-CM | POA: Diagnosis present

## 2017-01-07 NOTE — Patient Instructions (Signed)
  Bridge   Lie on back, legs bent. Press your hands onto the surface you are on, squeeze your rear end muscles together. Lift hips up. Repeat __10__ times. Do __2__ sessions per day.  Copyright  VHI. All rights reserved.

## 2017-01-07 NOTE — Therapy (Signed)
[facility] [address], [place], [address] Phone: [phone]   Fax:  [phone]  Physical Therapy Treatment  Patient Details  Name: Margaret Mcfarland MRN: [id number] Date of Birth: 1971-03-02 Referring Provider: Mable Paris, FNP   Encounter Date: 01/07/2017  PT End of Session - 01/07/17 1607    Visit Number  2    Number of Visits  11    Date for PT Re-Evaluation  02/07/17    PT Start Time  9798    PT Stop Time  1648    PT Time Calculation (min)  41 min    Activity Tolerance  Patient tolerated treatment well    Behavior During Therapy  [facility] for tasks assessed/performed       Past Medical History:  Diagnosis Date  . Anxiety   . Blood in stool   . Chicken pox   . Genital warts   . Kidney disease    per pt medical screening form    Past Surgical History:  Procedure Laterality Date  . endoscopy    . GUM SURGERY  2008  . laproscopy      There were no vitals filed for this visit.  Subjective Assessment - 01/07/17 1608    Subjective  L low back no better. Runs down her hips. Has an orthopedic appointment for next Monday 01/14/2017.  Fell on her tub about a week ago due to her L leg because it gave away.  L hip is 1-2/10 currently 5-6/10 low back pain currently.     Pertinent History  Chronic L low back pain since 2 years (started [facility] 2016). Pain began gradually. Pt was at the doctor's office for blood work, was asked if she had pain by the nurse, pt mentioned to her about her back, MD told her to try exercises (standing trunk rotation, standing forward flexion) for 2 months which did not help. Called her MD back and was sent to PT.  Denies LE paresthesia. Pt states feeling pain L anterior lateral hip to posterior hip about 2 weeks ago. Does not know what caused it and has not had that before. Denies problems with bowel or bladder function.  Walking does not bother her back much at all.     Patient  Stated Goals  Pt expresses desire to get better.     Currently in Pain?  Yes    Pain Score  6     Pain Onset  More than a month ago                              PT Education - 01/07/17 1840    Education provided  Yes    Education Details  ther-ex, HEP    Person(s) Educated  Patient;Other (comment)    Methods  Explanation;Demonstration;Tactile cues;Verbal cues;Handout    Comprehension  Verbalized understanding;Returned demonstration         Objectives  5-6/10 L low back pain   Pt states not feeling a difference with the HEP.  Denies unexplained changes in weight.   Pt states back feels better in prone compared to sitting.   Manual therapy  Prone UPA to R L5, L4, L3, L 2, L1 transverse process grade 3- to 3  No change is back pain    There-ex  Prone press-ups 5x5 seconds for 3 sets  Supine bridge 10x2   Supine lower trunk rotation 10x2  Standing rows resisting red band 10x2  Standing low rows resisting red band 10x2   R latearl shift posture: increases L low back pulling L lateral shift posture: same back, not worse than initial pain level R side bend: reproduced L low back pulling/discomfort.   Standing L trunk side bend 10x5 seconds   Work on posture next visit if appropriate.   Improved exercise technique, movement at target joints, use of target muscles after mod verbal, visual, tactile cues.     No change in back pain following R UPA to lumbar transverse processes. Worked on trunk mobility, trunk and hip strengthening to help with back pain. Pt tolerated session without aggravation of symptoms.           PT Long Term Goals - 01/02/17 1626      PT LONG TERM GOAL #1   Title  Patient will have a decrease in L low back pain to 5/10 or less at worst and R low back pain to 0/10 at most to promote ability to perform functional tasks.     Baseline  8/10 L low back, 2/10 R low back pain at most (01/02/2017)    Time  5    Period   Weeks    Status  New    Target Date  02/07/17      PT LONG TERM GOAL #2   Title  Patient will improve bilateral glute max strength by at least 1/2 MMT to promote ability to perform functional tasks with less back pain.     Time  5    Period  Weeks    Status  New    Target Date  02/07/17      PT LONG TERM GOAL #3   Title  Patient will improve her Modified Oswestry Low Back Pain disability questionnaire score to 0% as a demonstration of improved function.     Baseline  6% (01/02/2017)    Time  5    Period  Weeks    Status  New    Target Date  02/07/17            Plan - 01/07/17 1841    Clinical Impression Statement  No change in back pain following R UPA to lumbar transverse processes. Worked on trunk mobility, trunk and hip strengthening to help with back pain. Pt tolerated session without aggravation of symptoms.     History and Personal Factors relevant to plan of care:  Chronic condition    Clinical Presentation  Stable    Clinical Presentation due to:  Pt tolerated session well without aggravation of symptoms.     Clinical Decision Making  Low    Rehab Potential  Good    Clinical Impairments Affecting Rehab Potential  age, good family support (sister)    PT Frequency  2x / week    PT Duration  Other (comment) 5 weeks   5 weeks   PT Treatment/Interventions  Therapeutic activities;Therapeutic exercise;Manual techniques;Aquatic Therapy;Electrical Stimulation;Iontophoresis 4mg /ml Dexamethasone;Traction;Ultrasound;Neuromuscular re-education;Patient/family education;Dry needling    PT Next Visit Plan  glute strengthening, posture, lumbar and thoracic mobility    Consulted and Agree with Plan of Care  Patient       Patient will benefit from skilled therapeutic intervention in order to improve the following deficits and impairments:  Pain, Postural dysfunction, Improper body mechanics  Visit Diagnosis: Chronic bilateral low back pain, with sciatica presence  unspecified  Abnormal posture     Problem List Patient Active Problem List  Diagnosis Date Noted  . Encounter for medical examination to establish care 10/16/2016  . Chronic left-sided low back pain without sciatica 10/16/2016  . Chest pain 10/16/2016  . Anxiety 07/18/2016  . Poison ivy dermatitis 07/18/2016  . Missed period 07/18/2016  . Purpura ([place]) 07/06/2015  . B12 deficiency 08/16/2014   Joneen Boers PT, DPT  01/07/2017, 6:47 PM  [facility] [address], [place], [address] Phone: [phone]   Fax:  [phone]  Name: Margaret Mcfarland MRN: [id number] Date of Birth: 1971/02/01

## 2017-01-08 ENCOUNTER — Encounter: Payer: Self-pay | Admitting: Obstetrics and Gynecology

## 2017-01-08 ENCOUNTER — Ambulatory Visit (INDEPENDENT_AMBULATORY_CARE_PROVIDER_SITE_OTHER): Payer: BC Managed Care – PPO | Admitting: Obstetrics and Gynecology

## 2017-01-08 VITALS — BP 124/78 | Ht 68.0 in | Wt 149.0 lb

## 2017-01-08 DIAGNOSIS — Z3041 Encounter for surveillance of contraceptive pills: Secondary | ICD-10-CM | POA: Diagnosis not present

## 2017-01-08 DIAGNOSIS — Z1151 Encounter for screening for human papillomavirus (HPV): Secondary | ICD-10-CM | POA: Diagnosis not present

## 2017-01-08 DIAGNOSIS — Z124 Encounter for screening for malignant neoplasm of cervix: Secondary | ICD-10-CM

## 2017-01-08 DIAGNOSIS — Z01419 Encounter for gynecological examination (general) (routine) without abnormal findings: Secondary | ICD-10-CM | POA: Diagnosis not present

## 2017-01-08 DIAGNOSIS — Z1231 Encounter for screening mammogram for malignant neoplasm of breast: Secondary | ICD-10-CM

## 2017-01-08 DIAGNOSIS — Z1239 Encounter for other screening for malignant neoplasm of breast: Secondary | ICD-10-CM

## 2017-01-08 MED ORDER — LEVONORGEST-ETH ESTRAD 91-DAY 0.15-0.03 MG PO TABS: 1.0000 | ORAL_TABLET | Freq: Every day | ORAL | 4 refills | Status: DC

## 2017-01-08 NOTE — Progress Notes (Signed)
PCP:  Burnard Hawthorne, FNP   Chief Complaint  Patient presents with  . Annual Exam     HPI:      [name] is a 46 y.o. No obstetric history on file. who LMP was Patient's last menstrual period was 11/26/2016., presents today for her annual examination.  Her menses are Q3 months with OCPs, lasting 1 days, very light.  Dysmenorrhea none. She does not have intermenstrual bleeding.  Sex activity: never Last Pap: January 02, 2016  Results were: no abnormalities /neg HPV DNA 8/15. Likes yearly paps. Hx of STDs: none  Last mammogram: January 04, 2016  Results were: normal--routine follow-up in 12 months There is a FH of breast cancer in her mat cousin, genetic testing not indicated for pt. There is no FH of ovarian cancer. The patient does not know do self-breast exams.  Tobacco use: The patient denies current or previous tobacco use. Alcohol use: none No drug use.  Exercise: moderately active  She does get adequate calcium and Vitamin D in her diet.  Pt had colonoscopy in 2016 with Dr. Vira Agar with polyps/hemorrhoids. Repeat due in 5 yrs due to [place] colon cancer in her dad.  Labs with PCP.    Past Medical History:  Diagnosis Date  . Anxiety   . Blood in stool   . Chicken pox   . Genital warts   . Kidney disease    per pt medical screening form    Past Surgical History:  Procedure Laterality Date  . endoscopy    . GUM SURGERY  2008  . laproscopy      Family History  Problem Relation Age of Onset  . Colon cancer Father 27  . Arthritis Father   . Cancer Father        colon/prostate  . Arthritis Mother   . Hyperlipidemia Mother   . Heart disease Mother   . Stroke Mother   . Hypertension Mother   . Kidney disease Mother   . Diabetes Mother   . Stroke Maternal Grandmother   . Cancer Maternal Grandfather        prostate  . Stroke Paternal Grandfather   . Breast cancer Cousin 43    Social History   Socioeconomic History  . Marital status: Single   Spouse name: Not on file  . Number of children: Not on file  . Years of education: Not on file  . Highest education level: Not on file  Social Needs  . Financial resource strain: Not on file  . Food insecurity - worry: Not on file  . Food insecurity - inability: Not on file  . Transportation needs - medical: Not on file  . Transportation needs - non-medical: Not on file  Occupational History  . Not on file  Tobacco Use  . Smoking status: Never Smoker  . Smokeless tobacco: Never Used  Substance and Sexual Activity  . Alcohol use: No    Alcohol/week: 0.0 oz  . Drug use: No  . Sexual activity: No  Other Topics Concern  . Not on file  Social History Narrative   Lives with twin sister   Work- [employer]    No pets    No children    Right handed    No caffeine daily- tea occasionally; eats chocolate    Enjoys- shopping and eating out     No outpatient medications have been marked as taking for the 01/08/17 encounter (Office Visit) with Branston Halsted, Elmo Putt  B, PA-C.     ROS:  Review of Systems  Constitutional: Negative for fatigue, fever and unexpected weight change.  Respiratory: Negative for cough, shortness of breath and wheezing.   Cardiovascular: Negative for chest pain, palpitations and leg swelling.  Gastrointestinal: Negative for blood in stool, constipation, diarrhea, nausea and vomiting.  Endocrine: Negative for cold intolerance, heat intolerance and polyuria.  Genitourinary: Negative for dyspareunia, dysuria, flank pain, frequency, genital sores, hematuria, menstrual problem, pelvic pain, urgency, vaginal bleeding, vaginal discharge and vaginal pain.  Musculoskeletal: Negative for back pain, joint swelling and myalgias.  Skin: Negative for rash.  Neurological: Negative for dizziness, syncope, light-headedness, numbness and headaches.  Hematological: Negative for adenopathy.  Psychiatric/Behavioral: Negative for agitation, confusion, sleep  disturbance and suicidal ideas. The patient is not nervous/anxious.      Objective: BP 124/78   Ht 5\' 8"  (1.727 m)   Wt 149 lb (67.6 kg)   LMP 11/26/2016   BMI 22.66 kg/m    Physical Exam  Constitutional: She is oriented to person, place, and time. She appears well-developed and well-nourished.  Genitourinary: Vagina normal and uterus normal. There is no rash or tenderness on the right labia. There is no rash or tenderness on the left labia. No erythema or tenderness in the vagina. No vaginal discharge found. Right adnexum does not display mass and does not display tenderness. Left adnexum does not display mass and does not display tenderness. Cervix does not exhibit motion tenderness or polyp. Uterus is not enlarged or tender.  Neck: Normal range of motion. No thyromegaly present.  Cardiovascular: Normal rate, regular rhythm and normal heart sounds.  No murmur heard. Pulmonary/Chest: Effort normal and breath sounds normal. Right breast exhibits no mass, no nipple discharge, no skin change and no tenderness. Left breast exhibits no mass, no nipple discharge, no skin change and no tenderness.  Abdominal: Soft. There is no tenderness. There is no guarding.  Musculoskeletal: Normal range of motion.  Neurological: She is alert and oriented to person, place, and time. No cranial nerve deficit.  Psychiatric: She has a normal mood and affect. Her behavior is normal.  Vitals reviewed.   Assessment/Plan: Encounter for annual routine gynecological examination  Cervical cancer screening - Plan: IGP, Aptima HPV  Screening for HPV (human papillomavirus) - Plan: IGP, Aptima HPV  Encounter for surveillance of contraceptive pills - OCP RF. - Plan: levonorgestrel-ethinyl estradiol (JOLESSA) 0.15-0.03 MG tablet  Screening for breast cancer - Pt has mammo sched 01/23/17  Meds ordered this encounter  Medications  . levonorgestrel-ethinyl estradiol (JOLESSA) 0.15-0.03 MG tablet    Sig: Take 1  tablet daily by mouth.    Dispense:  1 Package    Refill:  4             GYN counsel mammography screening, adequate intake of calcium and vitamin D, diet and exercise     F/U  Return in about 1 year (around 01/08/2018).  Raetta Agostinelli B. Eiza Canniff, PA-C 01/08/2017 2:53 PM

## 2017-01-08 NOTE — Patient Instructions (Signed)
I value your feedback and appreciate you entrusting us with your care. If you get a [place] patient survey, I would appreciate you taking the time to let us know what your experience was like. Thank you! 

## 2017-01-10 ENCOUNTER — Ambulatory Visit: Payer: BC Managed Care – PPO

## 2017-01-10 DIAGNOSIS — M545 Low back pain: Secondary | ICD-10-CM | POA: Diagnosis not present

## 2017-01-10 DIAGNOSIS — G8929 Other chronic pain: Secondary | ICD-10-CM

## 2017-01-10 DIAGNOSIS — R293 Abnormal posture: Secondary | ICD-10-CM

## 2017-01-10 NOTE — Therapy (Signed)
[facility] [address], [place], [address] Phone: [phone]   Fax:  [phone]  Physical Therapy Treatment  Patient Details  Name: Margaret Mcfarland MRN: [id number] Date of Birth: [birth year] Referring Provider: Mable Paris, FNP   Encounter Date: 01/10/2017  PT End of Session - 01/10/17 1611    Visit Number  3    Number of Visits  11    Date for PT Re-Evaluation  02/07/17    PT Start Time  3267    PT Stop Time  1656    PT Time Calculation (min)  45 min    Activity Tolerance  Patient tolerated treatment well    Behavior During Therapy  [facility] for tasks assessed/performed       Past Medical History:  Diagnosis Date  . Anxiety   . Blood in stool   . Chicken pox   . Genital warts   . Kidney disease    per pt medical screening form    Past Surgical History:  Procedure Laterality Date  . endoscopy    . GUM SURGERY  2008  . laproscopy      There were no vitals filed for this visit.  Subjective Assessment - 01/10/17 1613    Subjective  L low back pain comes in spells. Does not have much pain right now but last night she felt L posterior lateral hip to her L thigh (around L4 dermatome).  Pt was sitting on a rocking chair recliner. Sitting usually bothers her back a lot.  3-4/10 currently. 7-8/10 yesterday.     Pertinent History  Chronic L low back pain since 2 years (started [facility] 2016). Pain began gradually. Pt was at the doctor's office for blood work, was asked if she had pain by the nurse, pt mentioned to her about her back, MD told her to try exercises (standing trunk rotation, standing forward flexion) for 2 months which did not help. Called her MD back and was sent to PT.  Denies LE paresthesia. Pt states feeling pain L anterior lateral hip to posterior hip about 2 weeks ago. Does not know what caused it and has not had that before. Denies problems with bowel or bladder function.  Walking does not  bother her back much at all.     Patient Stated Goals  Pt expresses desire to get better.     Currently in Pain?  Yes    Pain Score  4     Pain Onset  More than a month ago                              PT Education - 01/10/17 1622    Education provided  Yes    Education Details  ther-ex, HEP    Person(s) Educated  Patient    Methods  Explanation;Demonstration;Tactile cues;Verbal cues    Comprehension  Verbalized understanding;Returned demonstration          Objectives   There-ex   Supine bridge with bilateral shoulder extension isometrics 10x2   Supine lower trunk rotation 10x2  Supine posterior pelvic tilting 10x5 seconds for 2 sets  Supine bilateral shoulder extension resisting yellow band 10x3   Standing hip machine:     L hip extension plate 10 for 12W5   Standing bilateral shoulder low rows resisting yellow band 10x5 seconds for 3 sets   Sitting on dyna disc    Gentle  manual perturbation from PT 1 minute x 3  Try working on pelvic control with gait (such as SLS with pelvic control) next visit if appropriate   Improved exercise technique, movement at target joints, use of target muscles after mod verbal, visual, tactile cues.   More upright posture observed after performing exercises. Worked on trunk strengthening in neutral positions as well as hip strengthening to help decrease back pain. Pt tolerated session well without aggravation of symptoms.         PT Long Term Goals - 01/02/17 1626      PT LONG TERM GOAL #1   Title  Patient will have a decrease in L low back pain to 5/10 or less at worst and R low back pain to 0/10 at most to promote ability to perform functional tasks.     Baseline  8/10 L low back, 2/10 R low back pain at most (01/02/2017)    Time  5    Period  Weeks    Status  New    Target Date  02/07/17      PT LONG TERM GOAL #2   Title  Patient will improve bilateral glute max strength by at least 1/2 MMT  to promote ability to perform functional tasks with less back pain.     Time  5    Period  Weeks    Status  New    Target Date  02/07/17      PT LONG TERM GOAL #3   Title  Patient will improve her Modified Oswestry Low Back Pain disability questionnaire score to 0% as a demonstration of improved function.     Baseline  6% (01/02/2017)    Time  5    Period  Weeks    Status  New    Target Date  02/07/17            Plan - 01/10/17 1657    Clinical Impression Statement  More upright posture observed after performing exercises. Worked on trunk strengthening in neutral positions as well as hip strengthening to help decrease back pain. Pt tolerated session well without aggravation of symptoms.     History and Personal Factors relevant to plan of care:  Chronic condition    Clinical Presentation  Stable    Clinical Presentation due to:  Pt tolerated session well without aggravation of symptoms.     Clinical Decision Making  Low    Rehab Potential  Good    Clinical Impairments Affecting Rehab Potential  age, good family support (sister)    PT Frequency  2x / week    PT Duration  Other (comment) 5 weeks    PT Treatment/Interventions  Therapeutic activities;Therapeutic exercise;Manual techniques;Aquatic Therapy;Electrical Stimulation;Iontophoresis 4mg /ml Dexamethasone;Traction;Ultrasound;Neuromuscular re-education;Patient/family education;Dry needling    PT Next Visit Plan  glute strengthening, posture, lumbar and thoracic mobility    Consulted and Agree with Plan of Care  Patient       Patient will benefit from skilled therapeutic intervention in order to improve the following deficits and impairments:  Pain, Postural dysfunction, Improper body mechanics  Visit Diagnosis: Chronic bilateral low back pain, with sciatica presence unspecified  Abnormal posture     Problem List Patient Active Problem List   Diagnosis Date Noted  . Encounter for medical examination to establish care  10/16/2016  . Chronic left-sided low back pain without sciatica 10/16/2016  . Chest pain 10/16/2016  . Anxiety 07/18/2016  . Poison ivy dermatitis 07/18/2016  . Missed period 07/18/2016  .  Purpura ([place]) 07/06/2015  . B12 deficiency 08/16/2014    Joneen Boers PT, DPT   01/10/2017, 5:11 PM  [facility] [address] [place], [address] Phone: [phone]   Fax:  [phone]  Name: Margaret Mcfarland MRN: [id number] Date of Birth: 10-Jan-1971

## 2017-01-10 NOTE — Patient Instructions (Signed)
Strengthening: Resisted Extension    Keep a neutral back posture.  Hold band, one in each hand, arms forward. Pull hands back towards the front your your hips, elbow straight.  Hold for 5 seconds  Repeat ___10_ times per set. Do _3___ sets per session. Do _1__ sessions per day.  http://orth.exer.us/832   Copyright  VHI. All rights reserved.

## 2017-01-11 LAB — IGP, APTIMA HPV
HPV APTIMA: NEGATIVE
PAP SMEAR COMMENT: 0

## 2017-01-14 ENCOUNTER — Other Ambulatory Visit: Payer: Self-pay | Admitting: Family Medicine

## 2017-01-14 NOTE — Telephone Encounter (Signed)
Patient of Mable Paris

## 2017-01-15 ENCOUNTER — Ambulatory Visit: Payer: BC Managed Care – PPO

## 2017-01-15 DIAGNOSIS — M545 Low back pain: Principal | ICD-10-CM

## 2017-01-15 DIAGNOSIS — R293 Abnormal posture: Secondary | ICD-10-CM

## 2017-01-15 DIAGNOSIS — G8929 Other chronic pain: Secondary | ICD-10-CM

## 2017-01-15 NOTE — Therapy (Signed)
[facility] [address], [place], [address] Phone: [phone]   Fax:  [phone]  Physical Therapy Treatment  Patient Details  Name: Margaret Mcfarland MRN: [id number] Date of Birth: [birth year] Referring Provider: Mable Paris, FNP   Encounter Date: 01/15/2017  PT End of Session - 01/15/17 1434    Visit Number  4    Number of Visits  11    Date for PT Re-Evaluation  02/07/17    PT Start Time  7672    PT Stop Time  1522    PT Time Calculation (min)  47 min    Activity Tolerance  Patient tolerated treatment well    Behavior During Therapy  [facility] for tasks assessed/performed       Past Medical History:  Diagnosis Date  . Anxiety   . Blood in stool   . Chicken pox   . Genital warts   . Kidney disease    per pt medical screening form    Past Surgical History:  Procedure Laterality Date  . endoscopy    . GUM SURGERY  2008  . laproscopy      There were no vitals filed for this visit.  Subjective Assessment - 01/15/17 1437    Subjective  Pt states Dr. Mack Guise gave her steroids and if that does not help, then he might do an MRI. Pt states going to set herself up for an MRI.  L hip hurt all day but has eased off now. Back bothers her now instead, about 4-5/10.  The back pain is about the same. Dr. Mack Guise is in charge of her back now.     Pertinent History  Chronic L low back pain since 2 years (started [facility] 2016). Pain began gradually. Pt was at the doctor's office for blood work, was asked if she had pain by the nurse, pt mentioned to her about her back, MD told her to try exercises (standing trunk rotation, standing forward flexion) for 2 months which did not help. Called her MD back and was sent to PT.  Denies LE paresthesia. Pt states feeling pain L anterior lateral hip to posterior hip about 2 weeks ago. Does not know what caused it and has not had that before. Denies problems with bowel or  bladder function.  Walking does not bother her back much at all.     Patient Stated Goals  Pt expresses desire to get better.     Currently in Pain?  Yes    Pain Score  5     Pain Onset  More than a month ago                              PT Education - 01/15/17 1451    Education provided  Yes    Education Details  ther-ex, HEP    Person(s) Educated  Patient    Methods  Explanation;Demonstration;Tactile cues;Verbal cues    Comprehension  Verbalized understanding;Returned demonstration         Objectives   Ther-ex  Supine L knee to chest 10x2 with 5 seconds. L low back feels a little bit better.  Supine L hip extension manually resisted leg press 10x3  supine SLR R hip flexion 10x3  Supine lower trunk rotation 10x2  Limited L lumbar rotation observed  R S/L bows and arrows 10x2 to promote L lumbar rotation    Decreased back pain  to 2-3/10 afterwards in sitting.  Seated L trunk rotation 10x3. Back a little worse afterwards  Seated R trunk rotation 10x2. Feels better compared to L trunk rotation after first set of 10. L posterior low back pain after performing second set.    Sitting with L foot propped on 6-8 inch stool. L low back feels more comfortable  Improved exercise technique, movement at target joints, use of target muscles after min to mod verbal, visual, tactile cues.    Manual therapy  Supine muscle energy technique to promote posterior nutation of L innominate    No back pain afterwards in supine. Back pain returns in sitting.    Decreased L low back pain with exercises promoting posterior nutation of L pelvis. Continue working on trunk and L glute max strengthening.       PT Long Term Goals - 01/02/17 1626      PT LONG TERM GOAL #1   Title  Patient will have a decrease in L low back pain to 5/10 or less at worst and R low back pain to 0/10 at most to promote ability to perform functional tasks.     Baseline  8/10 L low  back, 2/10 R low back pain at most (01/02/2017)    Time  5    Period  Weeks    Status  New    Target Date  02/07/17      PT LONG TERM GOAL #2   Title  Patient will improve bilateral glute max strength by at least 1/2 MMT to promote ability to perform functional tasks with less back pain.     Time  5    Period  Weeks    Status  New    Target Date  02/07/17      PT LONG TERM GOAL #3   Title  Patient will improve her Modified Oswestry Low Back Pain disability questionnaire score to 0% as a demonstration of improved function.     Baseline  6% (01/02/2017)    Time  5    Period  Weeks    Status  New    Target Date  02/07/17            Plan - 01/15/17 1745    Clinical Impression Statement  Decreased L low back pain with exercises promoting posterior nutation of L pelvis. Continue working on trunk and L glute max strengthening.     History and Personal Factors relevant to plan of care:  Chronic condition    Clinical Presentation  Stable    Clinical Presentation due to:  Decreased back pain with exercises to promote posterior nutation of L pelvis.    Clinical Decision Making  Low    Rehab Potential  Good    Clinical Impairments Affecting Rehab Potential  age, good family support (sister)    PT Frequency  2x / week    PT Duration  Other (comment) 5 weeks    PT Treatment/Interventions  Therapeutic activities;Therapeutic exercise;Manual techniques;Aquatic Therapy;Electrical Stimulation;Iontophoresis 4mg /ml Dexamethasone;Traction;Ultrasound;Neuromuscular re-education;Patient/family education;Dry needling    PT Next Visit Plan  glute strengthening, posture, lumbar and thoracic mobility    Consulted and Agree with Plan of Care  Patient       Patient will benefit from skilled therapeutic intervention in order to improve the following deficits and impairments:  Pain, Postural dysfunction, Improper body mechanics  Visit Diagnosis: Chronic bilateral low back pain, with sciatica presence  unspecified  Abnormal posture     Problem List  Patient Active Problem List   Diagnosis Date Noted  . Encounter for medical examination to establish care 10/16/2016  . Chronic left-sided low back pain without sciatica 10/16/2016  . Chest pain 10/16/2016  . Anxiety 07/18/2016  . Poison ivy dermatitis 07/18/2016  . Missed period 07/18/2016  . Purpura ([place]) 07/06/2015  . B12 deficiency 08/16/2014   Joneen Boers PT, DPT  01/15/2017, 5:56 PM  [facility] [address], [place], [address] Phone: [phone]   Fax:  [phone]  Name: Princes Finger MRN: [id number] Date of Birth: 08/10/1970

## 2017-01-15 NOTE — Patient Instructions (Signed)
  Lying down on your back   Pull your left knee towards your chest for 5 seconds    Repeat 10 times.   Perform 3 sets daily.

## 2017-01-17 ENCOUNTER — Ambulatory Visit: Payer: BC Managed Care – PPO

## 2017-01-17 DIAGNOSIS — M545 Low back pain: Secondary | ICD-10-CM | POA: Diagnosis not present

## 2017-01-17 DIAGNOSIS — R293 Abnormal posture: Secondary | ICD-10-CM

## 2017-01-17 DIAGNOSIS — G8929 Other chronic pain: Secondary | ICD-10-CM

## 2017-01-17 NOTE — Therapy (Signed)
[facility] [address] [place], [address] Phone: [phone]   Fax:  [phone]  Physical Therapy Treatment  Patient Details  Name: Margaret Mcfarland MRN: [id number] Date of Birth: 02-May-1970 Referring Provider: Mable Paris, FNP   Encounter Date: 01/17/2017  PT End of Session - 01/17/17 1438    Visit Number  5    Number of Visits  11    Date for PT Re-Evaluation  02/07/17    PT Start Time  7001    PT Stop Time  1528    PT Time Calculation (min)  50 min    Activity Tolerance  Patient tolerated treatment well    Behavior During Therapy  [facility] for tasks assessed/performed       Past Medical History:  Diagnosis Date  . Anxiety   . Blood in stool   . Chicken pox   . Genital warts   . Kidney disease    per pt medical screening form    Past Surgical History:  Procedure Laterality Date  . COLONOSCOPY N/A 08/25/2014   Procedure: COLONOSCOPY;  Surgeon: Manya Silvas, MD;  Location: [facility] ENDOSCOPY;  Service: Endoscopy;  Laterality: N/A;  . endoscopy    . GUM SURGERY  2008  . laproscopy      There were no vitals filed for this visit.  Subjective Assessment - 01/17/17 1439    Subjective  Has not had much pain in her back or hip today. It hurt last night for a couple of hours. Seems like when she rocks on a recliner chair, the movement irritates her back and the L hip.      Pertinent History  Chronic L low back pain since 2 years (started [facility] 2016). Pain began gradually. Pt was at the doctor's office for blood work, was asked if she had pain by the nurse, pt mentioned to her about her back, MD told her to try exercises (standing trunk rotation, standing forward flexion) for 2 months which did not help. Called her MD back and was sent to PT.  Denies LE paresthesia. Pt states feeling pain L anterior lateral hip to posterior hip about 2 weeks ago. Does not know what caused it and has not had that before.  Denies problems with bowel or bladder function.  Walking does not bother her back much at all.     Patient Stated Goals  Pt expresses desire to get better.     Currently in Pain?  Yes    Pain Score  2  1-2/10    Pain Onset  More than a month ago                              PT Education - 01/17/17 1630    Education provided  Yes    Education Details  ther-ex    [employer]) Educated  Patient    Methods  Explanation;Demonstration;Tactile cues;Verbal cues    Comprehension  Verbalized understanding;Returned demonstration          Objectives  Standing posture: L lumbar side bend, R side bend around thoracolumbar area  Ther-ex  Standing back extension    With L bias: irritates L low back, reproduces symptoms    With R bias: WFL  Standing trunk flexion     With L bias: irritates L low back but not as much as extension but more than flexion to the R  With R bias: irritates L low back   Standing back extension with bias to the R 3x5 with 5 second holds  Forward step up onto 3 inch step with L LE. Tendency for backward lean causing low back extension 10x  Standing bilateral shoulder extension resisted green band 10x3 with 5 second holds  Standing R shoulder adduction resisting green band 10x5 seconds for 3 sets. Possible increase in back pain per pt.     Standing hip machine    L hip extension plate 55 for [id number]  Omega machine     PNF chops to the R, plate 5 for [id number]   No sitting L low back pain after aforementioned exercises.    L static squat with R foot on slider, R LE slides back 10x2. Tendency for L trunk side bend  SLS on L LE with emphasis on pelvic control 10x10 seconds   1-2/10 low back pain after aforementioned exercises   Omega machine again  PNF chops to the R, plate 5 for [id number]    Decreased back pain to 1/10     Give as part of her HEP next visit if appropriate using theraband    Seated L shoulder extension R hip  flexion isometrics (L hand on R thigh) 10x5 seconds. No change in symptoms   Improved exercise technique, movement at target joints, use of target muscles after min to mod verbal, visual, tactile cues.    Decreased L low back pain following exercises to promote R oblique muscle use.             PT Long Term Goals - 01/02/17 1626      PT LONG TERM GOAL #1   Title  Patient will have a decrease in L low back pain to 5/10 or less at worst and R low back pain to 0/10 at most to promote ability to perform functional tasks.     Baseline  8/10 L low back, 2/10 R low back pain at most (01/02/2017)    Time  5    Period  Weeks    Status  New    Target Date  02/07/17      PT LONG TERM GOAL #2   Title  Patient will improve bilateral glute max strength by at least 1/2 MMT to promote ability to perform functional tasks with less back pain.     Time  5    Period  Weeks    Status  New    Target Date  02/07/17      PT LONG TERM GOAL #3   Title  Patient will improve her Modified Oswestry Low Back Pain disability questionnaire score to 0% as a demonstration of improved function.     Baseline  6% (01/02/2017)    Time  5    Period  Weeks    Status  New    Target Date  02/07/17            Plan - 01/17/17 1437    Clinical Impression Statement  Decreased L low back pain following exercises to promote R oblique muscle use.     History and Personal Factors relevant to plan of care:  Chronic condition    Clinical Presentation  Stable    Clinical Presentation due to:  Pt tolerated session well without aggravation of symptoms.     Clinical Decision Making  Low    Rehab Potential  Good    Clinical Impairments Affecting Rehab Potential  age, good  family support (sister)    PT Frequency  2x / week    PT Duration  Other (comment) 5 weeks    PT Treatment/Interventions  Therapeutic activities;Therapeutic exercise;Manual techniques;Aquatic Therapy;Electrical Stimulation;Iontophoresis 4mg /ml  Dexamethasone;Traction;Ultrasound;Neuromuscular re-education;Patient/family education;Dry needling    PT Next Visit Plan  glute strengthening, posture, lumbar and thoracic mobility    Consulted and Agree with Plan of Care  Patient       Patient will benefit from skilled therapeutic intervention in order to improve the following deficits and impairments:  Pain, Postural dysfunction, Improper body mechanics  Visit Diagnosis: Chronic bilateral low back pain, with sciatica presence unspecified  Abnormal posture     Problem List Patient Active Problem List   Diagnosis Date Noted  . Encounter for medical examination to establish care 10/16/2016  . Chronic left-sided low back pain without sciatica 10/16/2016  . Chest pain 10/16/2016  . Anxiety 07/18/2016  . Poison ivy dermatitis 07/18/2016  . Missed period 07/18/2016  . Purpura ([place]) 07/06/2015  . B12 deficiency 08/16/2014    Joneen Boers PT, DPT   01/17/2017, 4:40 PM  [facility] [address] [place], [address] Phone: [phone]   Fax:  [phone]  Name: Margaret Mcfarland MRN: [id number] Date of Birth: [birth year]

## 2017-01-22 ENCOUNTER — Ambulatory Visit: Payer: BC Managed Care – PPO

## 2017-01-22 DIAGNOSIS — M545 Low back pain: Principal | ICD-10-CM

## 2017-01-22 DIAGNOSIS — R293 Abnormal posture: Secondary | ICD-10-CM

## 2017-01-22 DIAGNOSIS — G8929 Other chronic pain: Secondary | ICD-10-CM

## 2017-01-22 NOTE — Patient Instructions (Addendum)
Upgraded to red first then green band when red is easy for bilateral shoulder extension HEP.

## 2017-01-22 NOTE — Therapy (Signed)
[facility] [address] [place], [address] Phone: [phone]   Fax:  [phone]  Physical Therapy Treatment  Patient Details  Name: Margaret Mcfarland MRN: [id number] Date of Birth: 1970/11/08 Referring Provider: Mable Paris, FNP   Encounter Date: 01/22/2017  PT End of Session - 01/22/17 1437    Visit Number  6    Number of Visits  11    Date for PT Re-Evaluation  02/07/17    PT Start Time  6962    PT Stop Time  1518    PT Time Calculation (min)  41 min    Activity Tolerance  Patient tolerated treatment well    Behavior During Therapy  [facility] for tasks assessed/performed       Past Medical History:  Diagnosis Date  . Anxiety   . Blood in stool   . Chicken pox   . Genital warts   . Kidney disease    per pt medical screening form    Past Surgical History:  Procedure Laterality Date  . COLONOSCOPY N/A 08/25/2014   Procedure: COLONOSCOPY;  Surgeon: Manya Silvas, MD;  Location: [facility] ENDOSCOPY;  Service: Endoscopy;  Laterality: N/A;  . endoscopy    . GUM SURGERY  2008  . laproscopy      There were no vitals filed for this visit.  Subjective Assessment - 01/22/17 1438    Subjective  L low back has hurt all day on on her L hip. Going for an MRI tomorrow morning (01/23/2017). 2-3/10 currently. Felt good for about 2-3 days after last session.     Pertinent History  Chronic L low back pain since 2 years (started [facility] 2016). Pain began gradually. Pt was at the doctor's office for blood work, was asked if she had pain by the nurse, pt mentioned to her about her back, MD told her to try exercises (standing trunk rotation, standing forward flexion) for 2 months which did not help. Called her MD back and was sent to PT.  Denies LE paresthesia. Pt states feeling pain L anterior lateral hip to posterior hip about 2 weeks ago. Does not know what caused it and has not had that before. Denies problems with bowel  or bladder function.  Walking does not bother her back much at all.     Patient Stated Goals  Pt expresses desire to get better.     Currently in Pain?  Yes    Pain Score  3     Pain Onset  More than a month ago                              PT Education - 01/22/17 1447    Education provided  Yes    Education Details  ther-ex    [employer]) Educated  Patient    Methods  Explanation;Demonstration;Tactile cues;Verbal cues    Comprehension  Verbalized understanding;Returned demonstration         Objectives  Ther-ex  Standing L glute max extension resisting green band 10x, then blue band 10x3 PNF chops blue band to the R 10x, then 10x5 seconds for 3 sets  Slight increase in L low back pain PNF chops blue band to the L 10x5 seconds for 3 sets  No change  Standing back extension with bias to the R 3x5 with 5 second holds  Forward step up onto 3 inch step with L LE.  10x, then with one riser 10x2   Standing bilateral shoulder extension resisting green band 10x3 with 5 second holds. Upgraded band to green instead of yellow for he HEP.    Standing hip machine                         L hip extension plate 55 for 75I4  Omega machine                          PNF chops to the R, plate 5 for 33I9    Check 90/90 hip IR next visit if appropriate     Improved exercise technique, movement at target joints, use of target muscles after min to mod verbal, visual, tactile cues.   Performed similar exercises to last visit involving L glute max strengthening and R oblique muscle use which helped decrease her back pain at that time. Symptoms however were the same after session today (no change in pain compared to start of today's session).      PT Long Term Goals - 01/02/17 1626      PT LONG TERM GOAL #1   Title  Patient will have a decrease in L low back pain to 5/10 or less at worst and R low back pain to 0/10 at most to promote ability to perform functional  tasks.     Baseline  8/10 L low back, 2/10 R low back pain at most (01/02/2017)    Time  5    Period  Weeks    Status  New    Target Date  02/07/17      PT LONG TERM GOAL #2   Title  Patient will improve bilateral glute max strength by at least 1/2 MMT to promote ability to perform functional tasks with less back pain.     Time  5    Period  Weeks    Status  New    Target Date  02/07/17      PT LONG TERM GOAL #3   Title  Patient will improve her Modified Oswestry Low Back Pain disability questionnaire score to 0% as a demonstration of improved function.     Baseline  6% (01/02/2017)    Time  5    Period  Weeks    Status  New    Target Date  02/07/17            Plan - 01/22/17 1450    Clinical Impression Statement  Performed similar exercises to last visit involving L glute max strengthening and R oblique muscle use which helped decrease her back pain at that time. Symptoms however were the same after session today (no change in pain compared to start of today's session).     History and Personal Factors relevant to plan of care:  Chronic condition    Clinical Presentation  Stable    Clinical Presentation due to:  Pt tolerated session without aggravation of symptoms.     Clinical Decision Making  Low    Rehab Potential  Good    Clinical Impairments Affecting Rehab Potential  age, good family support (sister)    PT Frequency  2x / week    PT Duration  Other (comment) 5 weeks    PT Treatment/Interventions  Therapeutic activities;Therapeutic exercise;Manual techniques;Aquatic Therapy;Electrical Stimulation;Iontophoresis 4mg /ml Dexamethasone;Traction;Ultrasound;Neuromuscular re-education;Patient/family education;Dry needling    PT Next Visit Plan  glute strengthening, posture, lumbar and thoracic mobility  Consulted and Agree with Plan of Care  Patient       Patient will benefit from skilled therapeutic intervention in order to improve the following deficits and impairments:   Pain, Postural dysfunction, Improper body mechanics  Visit Diagnosis: Chronic bilateral low back pain, with sciatica presence unspecified  Abnormal posture     Problem List Patient Active Problem List   Diagnosis Date Noted  . Encounter for medical examination to establish care 10/16/2016  . Chronic left-sided low back pain without sciatica 10/16/2016  . Chest pain 10/16/2016  . Anxiety 07/18/2016  . Poison ivy dermatitis 07/18/2016  . Missed period 07/18/2016  . Purpura ([place]) 07/06/2015  . B12 deficiency 08/16/2014    Joneen Boers PT, DPT   01/22/2017, 6:22 PM  [facility] [address] [place], [address] Phone: [phone]   Fax:  [phone]  Name: Margaret Mcfarland MRN: [id number] Date of Birth: [birth year]

## 2017-01-28 ENCOUNTER — Ambulatory Visit: Payer: BC Managed Care – PPO

## 2017-01-28 DIAGNOSIS — G8929 Other chronic pain: Secondary | ICD-10-CM

## 2017-01-28 DIAGNOSIS — M545 Low back pain: Principal | ICD-10-CM

## 2017-01-28 DIAGNOSIS — R293 Abnormal posture: Secondary | ICD-10-CM

## 2017-01-28 NOTE — Patient Instructions (Signed)
  Pt was recommended to use a lumbar towel roll whenever sitting. Pt demonstrated and verbalized understanding.

## 2017-01-28 NOTE — Therapy (Signed)
[place] [address], [place], [address] Phone: [phone]   Fax:  [phone]  Physical Therapy Treatment  Patient Details  Name: Margaret Mcfarland MRN: [id number] Date of Birth: 10/11/1970 Referring Provider: Mable Paris, FNP   Encounter Date: 01/28/2017  PT End of Session - 01/28/17 1517    Visit Number  7    Number of Visits  11    Date for PT Re-Evaluation  02/07/17    PT Start Time  2778    PT Stop Time  1605    PT Time Calculation (min)  48 min    Activity Tolerance  Patient tolerated treatment well    Behavior During Therapy  [facility] for tasks assessed/performed       Past Medical History:  Diagnosis Date  . Anxiety   . Blood in stool   . Chicken pox   . Genital warts   . Kidney disease    per pt medical screening form    Past Surgical History:  Procedure Laterality Date  . COLONOSCOPY N/A 08/25/2014   Procedure: COLONOSCOPY;  Surgeon: Manya Silvas, MD;  Location: [facility] ENDOSCOPY;  Service: Endoscopy;  Laterality: N/A;  . endoscopy    . GUM SURGERY  2008  . laproscopy      There were no vitals filed for this visit.  Subjective Assessment - 01/28/17 1519    Subjective  L low back is about 2-3/10 currently.     Pertinent History  Chronic L low back pain since 2 years (started [facility] 2016). Pain began gradually. Pt was at the doctor's office for blood work, was asked if she had pain by the nurse, pt mentioned to her about her back, MD told her to try exercises (standing trunk rotation, standing forward flexion) for 2 months which did not help. Called her MD back and was sent to PT.  Denies LE paresthesia. Pt states feeling pain L anterior lateral hip to posterior hip about 2 weeks ago. Does not know what caused it and has not had that before. Denies problems with bowel or bladder function.  Walking does not bother her back much at all.     Patient Stated Goals  Pt expresses desire to get  better.     Currently in Pain?  Yes    Pain Score  3     Pain Onset  More than a month ago         [facility] PT Assessment - 01/28/17 1520      PROM   Overall PROM Comments  supine hip IR at 90/90 position: 30 degrees L hip, 45 degrees L hip                          PT Education - 01/28/17 1549    Education provided  Yes    Education Details  ther-ex    Person(s) Educated  Patient    Methods  Explanation;Demonstration;Tactile cues;Verbal cues    Comprehension  Returned demonstration;Verbalized understanding         Objectives  Ther-ex  Supine hip IR at 90/90 position R and L hip. L hip more limited compared to R  Supine L hip IR stretch with PT, L foot lateral to R knee   L hip IR at 90/90 improved from 35 degrees to 40 degrees afterwards  L low back/hip pain feels worse afterwards however  Seated manually resisted L hip ER  10x3  Slight decrease in L hip/low back pain  Supine L hip ER manually resisted in 90/90 position 10x3   Supine manually resisted L LE leg press 10x  Supine self manually resisted L glute max extension at end range hip flexion 10x5 seconds for 3 sets towel under L hip to promote posterior tilting/nutation  Back felt worse in sitting afterwards. No pain in supine.  Seated heel squeezes 10x5 seconds for 3 sets to promote glute muscle use  Sitting up straight with gentle lumbar extension on mat table. Slight decrease in back pain.     Sitting on chair with lumbar towel roll x 2 minutes. Decreased back pain to 1/10     Improved exercise technique, movement at target joints, use of target muscles after min to mod verbal, visual, tactile cues.    Decreased back pain with gentle lumbar extension to 1/10 after session.          PT Long Term Goals - 01/02/17 1626      PT LONG TERM GOAL #1   Title  Patient will have a decrease in L low back pain to 5/10 or less at worst and R low back pain to 0/10 at most to promote  ability to perform functional tasks.     Baseline  8/10 L low back, 2/10 R low back pain at most (01/02/2017)    Time  5    Period  Weeks    Status  New    Target Date  02/07/17      PT LONG TERM GOAL #2   Title  Patient will improve bilateral glute max strength by at least 1/2 MMT to promote ability to perform functional tasks with less back pain.     Time  5    Period  Weeks    Status  New    Target Date  02/07/17      PT LONG TERM GOAL #3   Title  Patient will improve her Modified Oswestry Low Back Pain disability questionnaire score to 0% as a demonstration of improved function.     Baseline  6% (01/02/2017)    Time  5    Period  Weeks    Status  New    Target Date  02/07/17            Plan - 01/28/17 1516    Clinical Impression Statement  Decreased back pain with gentle lumbar extension to 1/10 after session.      History and Personal Factors relevant to plan of care:  Chronic condition    Clinical Presentation  Stable    Clinical Presentation due to:  Decreased back pain after session.     Clinical Decision Making  Low    Rehab Potential  Good    Clinical Impairments Affecting Rehab Potential  age, good family support (sister)    PT Frequency  2x / week    PT Duration  Other (comment) 5 weeks    PT Treatment/Interventions  Therapeutic activities;Therapeutic exercise;Manual techniques;Aquatic Therapy;Electrical Stimulation;Iontophoresis 4mg /ml Dexamethasone;Traction;Ultrasound;Neuromuscular re-education;Patient/family education;Dry needling    PT Next Visit Plan  glute strengthening, posture, lumbar and thoracic mobility    Consulted and Agree with Plan of Care  Patient       Patient will benefit from skilled therapeutic intervention in order to improve the following deficits and impairments:  Pain, Postural dysfunction, Improper body mechanics  Visit Diagnosis: Chronic bilateral low back pain, with sciatica presence unspecified  Abnormal  posture  Problem List Patient Active Problem List   Diagnosis Date Noted  . Encounter for medical examination to establish care 10/16/2016  . Chronic left-sided low back pain without sciatica 10/16/2016  . Chest pain 10/16/2016  . Anxiety 07/18/2016  . Poison ivy dermatitis 07/18/2016  . Missed period 07/18/2016  . Purpura ([place]) 07/06/2015  . B12 deficiency 08/16/2014    Joneen Boers PT, DPT   01/28/2017, 6:15 PM  [facility] [facility] [address], [place], [address] Phone: [phone]   Fax:  [phone]  Name: Margaret Mcfarland MRN: [id number] Date of Birth: 24-Nov-1970

## 2017-01-30 ENCOUNTER — Ambulatory Visit: Payer: BC Managed Care – PPO

## 2017-01-30 DIAGNOSIS — M545 Low back pain: Secondary | ICD-10-CM | POA: Diagnosis not present

## 2017-01-30 DIAGNOSIS — R293 Abnormal posture: Secondary | ICD-10-CM

## 2017-01-30 DIAGNOSIS — G8929 Other chronic pain: Secondary | ICD-10-CM

## 2017-01-30 NOTE — Therapy (Signed)
[place] [facility] [address], [place], [address] Phone: [phone]   Fax:  [phone]  Physical Therapy Treatment  Patient Details  Name: Margaret Mcfarland MRN: [id number] Date of Birth: 1970-10-25 Referring Provider: Mable Paris, FNP   Encounter Date: 01/30/2017  PT End of Session - 01/30/17 1511    Visit Number  8    Number of Visits  11    Date for PT Re-Evaluation  02/07/17    PT Start Time  7425    PT Stop Time  1555    PT Time Calculation (min)  43 min    Activity Tolerance  Patient tolerated treatment well    Behavior During Therapy  [facility] for tasks assessed/performed       Past Medical History:  Diagnosis Date  . Anxiety   . Blood in stool   . Chicken pox   . Genital warts   . Kidney disease    per pt medical screening form    Past Surgical History:  Procedure Laterality Date  . COLONOSCOPY N/A 08/25/2014   Procedure: COLONOSCOPY;  Surgeon: Manya Silvas, MD;  Location: [facility] ENDOSCOPY;  Service: Endoscopy;  Laterality: N/A;  . endoscopy    . GUM SURGERY  2008  . laproscopy      There were no vitals filed for this visit.  Subjective Assessment - 01/30/17 1513    Subjective  L back and hip are pretty good today. Hurt quite a bit yesterday (4-5/10 back and L hip). Certain days is better than others. 2-3/10 L hip pain currently.  Tried the towel roll back support which helped a little bit.     Pertinent History  Chronic L low back pain since 2 years (started [facility] 2016). Pain began gradually. Pt was at the doctor's office for blood work, was asked if she had pain by the nurse, pt mentioned to her about her back, MD told her to try exercises (standing trunk rotation, standing forward flexion) for 2 months which did not help. Called her MD back and was sent to PT.  Denies LE paresthesia. Pt states feeling pain L anterior lateral hip to posterior hip about 2 weeks ago. Does not know what caused  it and has not had that before. Denies problems with bowel or bladder function.  Walking does not bother her back much at all.     Patient Stated Goals  Pt expresses desire to get better.     Currently in Pain?  Yes    Pain Score  3  2-3/10    Pain Onset  More than a month ago                              PT Education - 01/30/17 1535    Education provided  Yes    Education Details  ther-ex, HEP    Person(s) Educated  Patient    Methods  Explanation;Demonstration;Tactile cues;Verbal cues;Handout    Comprehension  Returned demonstration;Verbalized understanding          Objectives  Pt demonstrates tendency for slight R trunk rotation posture in sitting  Ther-ex   Prone on elbows with bilateral hip IR position. 2 minutes, then 1 min.   Prone hip extension alternating 10x3 each side   Prone alternating hip extension with contralateral shoulder flexion 10x3 each side  Pt was recommended to get out on the R side of the  bed to decrease L side bend stretch with transfer compared to when getting out of the bed on the L side. Pt verbalized understanding.   Seated bilateral scapular retraction rows resisting yellow band 10x5 seconds    Then with red band 10x5 seconds for 2 sets   Seated bilateral shoulder extension resisting red band 10x5 seconds. No pain. Difficult to perform. No back or hip pain when performing exercise.     No L hip pain at rest but feels L back paraspinal pain   Seated bilateral shoulder extension isometrics, hands at thighs 10x5 seconds    No back or L hip pain when performing exercise.   Seated manually resisted trunk rotation 10x5 seconds each side in neutral       Improved exercise technique, movement at target joints, use of target muscles after min to mod verbal, visual, tactile cues.    Decreased back and L hip pain with gentle back extension and trunk muscle activation as well as in the unsupported neutral back seated  posture.        PT Long Term Goals - 01/02/17 1626      PT LONG TERM GOAL #1   Title  Patient will have a decrease in L low back pain to 5/10 or less at worst and R low back pain to 0/10 at most to promote ability to perform functional tasks.     Baseline  8/10 L low back, 2/10 R low back pain at most (01/02/2017)    Time  5    Period  Weeks    Status  New    Target Date  02/07/17      PT LONG TERM GOAL #2   Title  Patient will improve bilateral glute max strength by at least 1/2 MMT to promote ability to perform functional tasks with less back pain.     Time  5    Period  Weeks    Status  New    Target Date  02/07/17      PT LONG TERM GOAL #3   Title  Patient will improve her Modified Oswestry Low Back Pain disability questionnaire score to 0% as a demonstration of improved function.     Baseline  6% (01/02/2017)    Time  5    Period  Weeks    Status  New    Target Date  02/07/17            Plan - 01/30/17 1511    Clinical Impression Statement  Decreased back and L hip pain with gentle back extension and trunk muscle activation as well as in the unsupported neutral back seated posture.     History and Personal Factors relevant to plan of care:  Chronic condition    Clinical Presentation  Stable    Clinical Presentation due to:  decreased back pain with neutral back posture, gentle extension as well as with trunk muscle activation    Clinical Decision Making  Low    Rehab Potential  Good    Clinical Impairments Affecting Rehab Potential  age, good family support (sister)    PT Frequency  2x / week    PT Duration  Other (comment) 5 weeks    PT Treatment/Interventions  Therapeutic activities;Therapeutic exercise;Manual techniques;Aquatic Therapy;Electrical Stimulation;Iontophoresis 4mg /ml Dexamethasone;Traction;Ultrasound;Neuromuscular re-education;Patient/family education;Dry needling    PT Next Visit Plan  glute strengthening, posture, lumbar and thoracic mobility     Consulted and Agree with Plan of Care  Patient  Patient will benefit from skilled therapeutic intervention in order to improve the following deficits and impairments:  Pain, Postural dysfunction, Improper body mechanics  Visit Diagnosis: Chronic bilateral low back pain, with sciatica presence unspecified  Abnormal posture     Problem List Patient Active Problem List   Diagnosis Date Noted  . Encounter for medical examination to establish care 10/16/2016  . Chronic left-sided low back pain without sciatica 10/16/2016  . Chest pain 10/16/2016  . Anxiety 07/18/2016  . Poison ivy dermatitis 07/18/2016  . Missed period 07/18/2016  . Purpura ([place]) 07/06/2015  . B12 deficiency 08/16/2014    Joneen Boers PT, DPT   01/30/2017, 7:54 PM  [place] [address], [place], [address] Phone: [phone]   Fax:  [phone]  Name: Margaret Mcfarland MRN: [id number] Date of Birth: 05/05/1970

## 2017-01-30 NOTE — Patient Instructions (Addendum)
Prone on elbows   Lying down on your stomach, prop yourself up on your elbows.    Keep your toes pointed in.    Remain in this position for 2 minutes    Perform 2 times daily.      Prone hip extension   Lying down on your stomach    Extend one hip, keeping your knee straight.    Rest   Then extend your other hip.    Repeat 10 times   Perform 3 sets daily.      Seated bilateral shoulder extension isometrics  Sitting on a chair   Gently press your hands on your thighs to feel your trunk muscles activate.    Hold for 5 seconds.    Repeat 10 times.   Perform 3 sets daily.

## 2017-02-05 ENCOUNTER — Ambulatory Visit: Payer: BC Managed Care – PPO | Attending: Family

## 2017-02-05 DIAGNOSIS — M545 Low back pain: Secondary | ICD-10-CM | POA: Diagnosis not present

## 2017-02-05 DIAGNOSIS — G8929 Other chronic pain: Secondary | ICD-10-CM

## 2017-02-05 DIAGNOSIS — R293 Abnormal posture: Secondary | ICD-10-CM | POA: Diagnosis present

## 2017-02-05 NOTE — Therapy (Signed)
[place] [facility] [address], [place], [address] Phone: [phone]   Fax:  [phone]  Physical Therapy Treatment  Patient Details  Name: Margaret Mcfarland MRN: [id number] Date of Birth: 13-Oct-1970 Referring Provider: Mable Paris, FNP   Encounter Date: 02/05/2017  PT End of Session - 02/05/17 1436    Visit Number  9    Number of Visits  15    Date for PT Re-Evaluation  02/21/17    PT Start Time  1594    PT Stop Time  1519    PT Time Calculation (min)  43 min    Activity Tolerance  Patient tolerated treatment well    Behavior During Therapy  [facility] for tasks assessed/performed       Past Medical History:  Diagnosis Date  . Anxiety   . Blood in stool   . Chicken pox   . Genital warts   . Kidney disease    per pt medical screening form    Past Surgical History:  Procedure Laterality Date  . COLONOSCOPY N/A 08/25/2014   Procedure: COLONOSCOPY;  Surgeon: Manya Silvas, MD;  Location: [facility] ENDOSCOPY;  Service: Endoscopy;  Laterality: N/A;  . endoscopy    . GUM SURGERY  2008  . laproscopy       There were no vitals filed for this visit.  Subjective Assessment - 02/05/17 1437    Subjective  My back feels some better. Went to Dr. Mack Guise for her MRI. Has a disc in her back but it's not causing the pain. He told her to keep on with the therapy.  Got a corisone shot for her L hip which felt better. MD thinks she has L trochanteric bursitis.  If pt still has L hip pain, MD might order an MRI for her L hip.  Back was pretty good after last session.   1-2/10 L low back, and 0-1/10 R low back pain at most for the past 7 days.  Wants to continue PT until 02/14/17 and will let us know afterwards if she wants to go further. Feels like PT is helping.      Pertinent History  Chronic L low back pain since 2 years (started [facility] 2016). Pain began gradually. Pt was at the doctor's office for blood work, was asked if she  had pain by the nurse, pt mentioned to her about her back, MD told her to try exercises (standing trunk rotation, standing forward flexion) for 2 months which did not help. Called her MD back and was sent to PT.  Denies LE paresthesia. Pt states feeling pain L anterior lateral hip to posterior hip about 2 weeks ago. Does not know what caused it and has not had that before. Denies problems with bowel or bladder function.  Walking does not bother her back much at all.     Patient Stated Goals  Pt expresses desire to get better.     Currently in Pain?  No/denies    Pain Score  0-No pain    Pain Onset  More than a month ago         [facility] PT Assessment - 02/05/17 0001      Observation/Other Assessments   Modified Oswertry  2%      Strength   Right Hip Extension  4/5    Left Hip Extension  4/5  PT Education - 02/05/17 1905    Education provided  Yes    Education Details  ther-ex    [employer]) Educated  Patient    Methods  Explanation;Demonstration;Tactile cues;Verbal cues    Comprehension  Verbalized understanding;Returned demonstration         Objectives  Pt demonstrates tendency for slight R trunk rotation posture in sitting  Wants to continue PT until 02/14/17 and will let us know afterwards if she wants to go further. Feels like PT is helping. 1-2/10 L low back pain at most for the past 7 days. 0-1/10 R low back pain at most for the past 7 days.     Ther-ex  Reviewed prone on elbows HEP secondary to pt not performig them properly (was tightening abdomen instead of relaxing and pt performing 2 min x 10 straight instead of 2x 2 min)       Prone on elbows x 2   Reviewed plan of care: continue until 12/13 per pt request. Will write re-cert for 40/81/4481 just in case she decides to use the other two sessions scheduled. Pt verbalized understanding.    Prone hip extension alternating 10x2 each side   Prone alternating hip extension  with contralateral shoulder flexion 10x2 each side  Standing alternating PNF chops red band 10x3 each side  Seated bilateral scapular retraction rows resisting red band 10x5 seconds for 2 sets   Seated bilateral shoulder extension resisting red band 10x5 seconds for 2 sets  Seated trunk flexion isometrics 10x5 seconds for 2 sets. Manual resistance from PT  Prone manually resisted hip extension 1x each LE    Reviewed progress with glute max strength with pt.    Improved exercise technique, movement at target joints, use of target muscles after min to mod verbal, visual, tactile cues.   Pt demonstrates overall decreased back pain, improved glute strength, function, and ability to tolerate sitting (sitting usually causes back pain) since initial evaluation. Pt making progress towards goals. She still demonstrates back pain especially with sitting and would benefit from continued skilled physical therapy services to help address.       PT Long Term Goals - 02/05/17 1913      PT LONG TERM GOAL #1   Title  Patient will have a decrease in L low back pain to 5/10 or less at worst and R low back pain to 0/10 at most to promote ability to perform functional tasks.     Baseline  8/10 L low back, 2/10 R low back pain at most (01/02/2017); 2/10 L low back, 1/10 R low back pain at most for the past 7 days (02/05/2017)    Time  2    Period  Weeks    Status  Partially Met    Target Date  02/21/17      PT LONG TERM GOAL #2   Title  Patient will improve bilateral glute max strength by at least 1/2 MMT to promote ability to perform functional tasks with less back pain.     Time  5    Period  Weeks    Status  Achieved      PT LONG TERM GOAL #3   Title  Patient will improve her Modified Oswestry Low Back Pain disability questionnaire score to 0% as a demonstration of improved function.     Baseline  6% (01/02/2017); 2% (02/05/2017)    Time  5    Period  Weeks    Status  Partially Met  Target Date  02/21/17            Plan - 02/05/17 1908    Clinical Impression Statement  Pt demonstrates overall decreased back pain, improved glute strength, function, and ability to tolerate sitting (sitting usually causes back pain) since initial evaluation. Pt making progress towards goals. She still demonstrates back pain especially with sitting and would benefit from continued skilled physical therapy services to help address.     History and Personal Factors relevant to plan of care:  Chronic condition    Clinical Presentation  Stable    Clinical Presentation due to:  decreased back pain    Clinical Decision Making  Low    Rehab Potential  Good    Clinical Impairments Affecting Rehab Potential  age, good family support (sister)    PT Frequency  2x / week    PT Duration  2 weeks 2.5 weeks    PT Treatment/Interventions  Therapeutic activities;Therapeutic exercise;Manual techniques;Aquatic Therapy;Electrical Stimulation;Iontophoresis '4mg'$ /ml Dexamethasone;Traction;Ultrasound;Neuromuscular re-education;Patient/family education;Dry needling    PT Next Visit Plan  glute strengthening, posture, lumbar and thoracic mobility    Consulted and Agree with Plan of Care  Patient       Patient will benefit from skilled therapeutic intervention in order to improve the following deficits and impairments:  Pain, Postural dysfunction, Improper body mechanics  Visit Diagnosis: Chronic bilateral low back pain, with sciatica presence unspecified - Plan: PT plan of care cert/re-cert  Abnormal posture - Plan: PT plan of care cert/re-cert     Problem List Patient Active Problem List   Diagnosis Date Noted  . Encounter for medical examination to establish care 10/16/2016  . Chronic left-sided low back pain without sciatica 10/16/2016  . Chest pain 10/16/2016  . Anxiety 07/18/2016  . Poison ivy dermatitis 07/18/2016  . Missed period 07/18/2016  . Purpura ([place]) 07/06/2015  . B12 deficiency  08/16/2014    Joneen Boers PT, DPT   02/05/2017, 7:23 PM  [facility] [facility] [address], [place], [address] Phone: [phone]   Fax:  [phone]  Name: Margaret Mcfarland MRN: [id number] Date of Birth: [birth year]

## 2017-02-07 ENCOUNTER — Ambulatory Visit: Payer: BC Managed Care – PPO

## 2017-02-07 DIAGNOSIS — M545 Low back pain: Principal | ICD-10-CM

## 2017-02-07 DIAGNOSIS — R293 Abnormal posture: Secondary | ICD-10-CM

## 2017-02-07 DIAGNOSIS — G8929 Other chronic pain: Secondary | ICD-10-CM

## 2017-02-07 NOTE — Patient Instructions (Signed)
  Gave SLS on L LE to work L glute 10x3 with 5 second holds as part of her HEP. Pt demonstrated and verbalized udnerstanding. Handout provided.

## 2017-02-07 NOTE — Therapy (Addendum)
[facility] [address], [place], [address] Phone: [phone]   Fax:  [phone]  Physical Therapy Treatment  Patient Details  Name: Margaret Mcfarland MRN: [id number] Date of Birth: April 10, 1970 Referring Provider: Mable Paris, FNP   Encounter Date: 02/07/2017  PT End of Session - 02/07/17 1611    Visit Number  10    Number of Visits  15    Date for PT Re-Evaluation  02/21/17    PT Start Time  7408 pt arrived late    PT Stop Time  1652    PT Time Calculation (min)  40 min    Activity Tolerance  Patient tolerated treatment well    Behavior During Therapy  [facility] for tasks assessed/performed       Past Medical History:  Diagnosis Date  . Anxiety   . Blood in stool   . Chicken pox   . Genital warts   . Kidney disease    per pt medical screening form    Past Surgical History:  Procedure Laterality Date  . COLONOSCOPY N/A 08/25/2014   Procedure: COLONOSCOPY;  Surgeon: Manya Silvas, MD;  Location: [facility] ENDOSCOPY;  Service: Endoscopy;  Laterality: N/A;  . endoscopy    . GUM SURGERY  2008  . laproscopy      There were no vitals filed for this visit.  Subjective Assessment - 02/07/17 1613    Subjective  Back is doing pretty good. No pain currently. L hip is better too. Does not know if it is the shot or the therapy but its feeling better.     Pertinent History  Chronic L low back pain since 2 years (started [facility] 2016). Pain began gradually. Pt was at the doctor's office for blood work, was asked if she had pain by the nurse, pt mentioned to her about her back, MD told her to try exercises (standing trunk rotation, standing forward flexion) for 2 months which did not help. Called her MD back and was sent to PT.  Denies LE paresthesia. Pt states feeling pain L anterior lateral hip to posterior hip about 2 weeks ago. Does not know what caused it and has not had that before. Denies problems with bowel or  bladder function.  Walking does not bother her back much at all.     Patient Stated Goals  Pt expresses desire to get better.     Currently in Pain?  No/denies    Pain Score  0-No pain    Pain Onset  More than a month ago                              PT Education - 02/07/17 1620    Education provided  Yes    Education Details  ther-ex    [employer]) Educated  Patient    Methods  Explanation;Demonstration;Tactile cues;Verbal cues    Comprehension  Returned demonstration;Verbalized understanding         Objectives  Pt demonstrates tendency for slight R trunk rotation posture in sitting  No TTP L hip (greater trochanter, and glute med and min muscle and distal insertions)    Ther-ex                                   Prone on elbows x 2   Prone alternating hip extension  10x2 each LE  Prone alternating hip extension with contralateral shoulder flexion 10x2 each side  Seated lat pulldowns plate 10 for 10 times, then plate 15 for 32T5   Seated pallof press resisting yellow band 10x5 seconds for 2 sets each side   Seated trunk flexion isometrics 10x5 seconds for 2 sets. Manual resistance from PT  SLS on L LE with light touch to no UE assist, emphasis on level pelvis 10x5 seconds   reviewd plan of care: per pt request, cancell rest of appointments because she is doing well. Pt to call back in 2 weeks if she feels like she needs more PT.   Seated with upright posture  Manual perturbation from PT to work trunk 1 minute  Improved exercise technique, movement at target joints, use of target muscles after min to mod verbal, visual, tactile cues.    Pt demonstrates significant improvement in back pain, improved glute strength, and improved ability to perform functional tasks since initial evaluation. Pt making good progress with PT towards goals. Pt to continue with her HEP for the next 2 weeks and call back to let us know if she needs more PT.         PT Long Term Goals - 02/05/17 1913      PT LONG TERM GOAL #1   Title  Patient will have a decrease in L low back pain to 5/10 or less at worst and R low back pain to 0/10 at most to promote ability to perform functional tasks.     Baseline  8/10 L low back, 2/10 R low back pain at most (01/02/2017); 2/10 L low back, 1/10 R low back pain at most for the past 7 days (02/05/2017)    Time  2    Period  Weeks    Status  Partially Met    Target Date  02/21/17      PT LONG TERM GOAL #2   Title  Patient will improve bilateral glute max strength by at least 1/2 MMT to promote ability to perform functional tasks with less back pain.     Time  5    Period  Weeks    Status  Achieved      PT LONG TERM GOAL #3   Title  Patient will improve her Modified Oswestry Low Back Pain disability questionnaire score to 0% as a demonstration of improved function.     Baseline  6% (01/02/2017); 2% (02/05/2017)    Time  5    Period  Weeks    Status  Partially Met    Target Date  02/21/17            Plan - 02/07/17 1630    Clinical Impression Statement  Pt demonstrates significant improvement in back pain, improved glute strength, and improved ability to perform functional tasks since initial evaluation. Pt making good progress with PT towards goals. Pt to continue with her HEP for the next 2 weeks and call back to let us know if she needs more PT.     History and Personal Factors relevant to plan of care:  Chronic condition    Clinical Presentation  Stable    Clinical Presentation due to:  no complain of back or hip pain at start of session    Clinical Decision Making  Low    Rehab Potential  Good    Clinical Impairments Affecting Rehab Potential  age, good family support (sister)    PT Frequency  2x /  week    PT Duration  2 weeks 2.5 weeks    PT Treatment/Interventions  Therapeutic activities;Therapeutic exercise;Manual techniques;Aquatic Therapy;Electrical Stimulation;Iontophoresis  63m/ml Dexamethasone;Traction;Ultrasound;Neuromuscular re-education;Patient/family education;Dry needling    PT Next Visit Plan  glute strengthening, posture, lumbar and thoracic mobility    Consulted and Agree with Plan of Care  Patient       Patient will benefit from skilled therapeutic intervention in order to improve the following deficits and impairments:  Pain, Postural dysfunction, Improper body mechanics  Visit Diagnosis: Chronic bilateral low back pain, with sciatica presence unspecified  Abnormal posture     Problem List Patient Active Problem List   Diagnosis Date Noted  . Encounter for medical examination to establish care 10/16/2016  . Chronic left-sided low back pain without sciatica 10/16/2016  . Chest pain 10/16/2016  . Anxiety 07/18/2016  . Poison ivy dermatitis 07/18/2016  . Missed period 07/18/2016  . Purpura ([place] 07/06/2015  . B12 deficiency 08/16/2014    MJoneen BoersPT, DPT   02/07/2017, 7:09 PM  [facility] [facility] [address] [place] [address]Phone: [phone]  Fax:  [phone] Name: KDublin CanteroMRN: [id number] of Birth: 02/10/1971-10-03

## 2017-02-12 ENCOUNTER — Ambulatory Visit: Payer: BC Managed Care – PPO

## 2017-02-13 ENCOUNTER — Ambulatory Visit
Admission: RE | Admit: 2017-02-13 | Discharge: 2017-02-13 | Disposition: A | Discharge status: Home / Self Care | Payer: BC Managed Care – PPO | Source: Ambulatory Visit | Attending: Family | Admitting: Family

## 2017-02-13 DIAGNOSIS — Z1231 Encounter for screening mammogram for malignant neoplasm of breast: Secondary | ICD-10-CM | POA: Diagnosis not present

## 2017-02-15 ENCOUNTER — Telehealth: Payer: Self-pay | Admitting: Family

## 2017-02-15 NOTE — Telephone Encounter (Signed)
Needs to see orthopedics,  Dose she haave a preference

## 2017-02-15 NOTE — Telephone Encounter (Signed)
Pain is intermittent. Above left hip bone, radiates to left side. Pain is rated at about a 5 or 6 at the worst times and about a 3 or 4 at times when its really not hurting that bad. Patient denies any recent falls or strenuous activity. Patient was seen by [name] in August and then PT 5 weeks. PT was not effective. Patient is wondering what the next step would be or what she should do? Please advise.

## 2017-02-15 NOTE — Telephone Encounter (Signed)
Patient states she is already seeing ortho. Advised patient to call them

## 2017-02-15 NOTE — Telephone Encounter (Signed)
Copied from [place]. Topic: Quick Communication - See Telephone Encounter >> Feb 15, 2017  9:47 AM Ether Griffins B wrote: CRM for notification. See Telephone encounter for:  Pt was seen in august by Arnett for left back pain. Was sent for PT for 5 weeks and pt is still having pain. She would like to know what steps she should take next.  02/15/17.

## 2017-03-01 ENCOUNTER — Telehealth: Payer: Self-pay | Admitting: *Deleted

## 2017-03-01 NOTE — Telephone Encounter (Signed)
Yes if she wants an appointment. That's fine

## 2017-03-01 NOTE — Telephone Encounter (Signed)
Patient stated that she has left sided pain in back at the last rib area on left side, rated at 7 or 8 at times mainly on the side and back, hurts worse laying in bed and sitting. Patient stated she is not hurting today and just wanted an appointment when she can get one to find out why she continues to hurt. Scheduled patient for 03/11/17 with you? NO urinary symptoms , no fevers, no nausea just intermittent pain. Ortho did MRI and they advised saw no reason for her pain . I have copied the MRI from [facility] note below.  IMPRESSION: Nearly normal. Very tiny posterior disc annulus fissure at L5-S1 along with disc desiccation but no herniation or stenosis. There is also minimal/early facet arthrosis at L5-S1.   Electronically Signed by: Mariea Clonts  Result Narrative  INDICATION: Radiculopathy, lumbar region. COMPARISON: None. TECHNIQUE:Multiplanar, multisequence MR imaging of the lumbar spine (MRI SPINE LUMBAR WO IV CONTRAST) was performed.  FINDINGS:  SEGMENTATION: 5 lumbar vertebral body types.  DISC LEVELS: - T12-L1: Normal. - L1-L2: Normal.  - L2-L3: Normal. - L3-L4: Normal.  - L4-L5: Normal. - L5-S1: Very tiny posterior disc annulus fissure.   OSSEOUS STRUCTURES: Mild disc desiccation. No stenosis or herniation. Minimal facet arthrosis. No acute marrow signal abnormality.  SPINAL CORD: No abnormal signal is demonstrated within the visualized distal spinal cord. Cauda equina appears unremarkable. Conus medullaris terminates at T12-L1.  PARASPINAL TISSUES: Intact.  VISUALIZED SACRUM:  Intact.  INCIDENTAL: N/A.

## 2017-03-01 NOTE — Telephone Encounter (Signed)
Copied from [place]. Topic: Quick Communication - See Telephone Encounter >> Feb 15, 2017  9:47 AM Ether Griffins B wrote: CRM for notification. See Telephone encounter for:  Pt was seen in august by Arnett for left back pain. Was sent for PT for 5 weeks and pt is still having pain. She would like to know what steps she should take next.  02/15/17. >> Mar 01, 2017  9:33 AM Conception Chancy, NT wrote: Patient states she was seen by orthopedic and they told her she needed to get her blood drawn and check her kidneys. She states her mother had kidney cancer. She is still hurting in her bad on the left side. >> Mar 01, 2017  9:33 AM Conception Chancy, NT wrote: Told patient I could not schedule lab work without an order. Please advise and call patient.

## 2017-03-11 ENCOUNTER — Encounter: Payer: Self-pay | Admitting: Family Medicine

## 2017-03-11 ENCOUNTER — Ambulatory Visit: Payer: BC Managed Care – PPO | Admitting: Family Medicine

## 2017-03-11 ENCOUNTER — Ambulatory Visit: Payer: BC Managed Care – PPO | Admitting: Internal Medicine

## 2017-03-11 VITALS — BP 100/78 | HR 76 | Temp 97.9°F | Resp 18 | Wt 150.5 lb

## 2017-03-11 DIAGNOSIS — M545 Low back pain: Secondary | ICD-10-CM | POA: Diagnosis not present

## 2017-03-11 MED ORDER — DICLOFENAC SODIUM 75 MG PO TBEC: 75.0000 mg | DELAYED_RELEASE_TABLET | Freq: Two times a day (BID) | ORAL | 0 refills | Status: DC

## 2017-03-11 NOTE — Progress Notes (Signed)
Subjective:    Patient ID: Margaret Mcfarland, female    DOB: 1970-05-22, 47 y.o.   MRN: [id number]  HPI  Margaret Mcfarland is a 47 year old female who presents today with back pain.  BACK PAIN  Location: Left side pain  Quality: 7 at the worst but can be a zero Onset: 2 years Worse with: sitting and lying down    Better with:  nothing      Radiation: can radiate at times to her hip Trauma: No Best sitting/standing/leaning forward: No  Red Flags Fecal/urinary incontinence: No  Numbness/Weakness: No Fever/chills/sweats: No Night pain:  No Unexplained weight loss: No No relief with bedrest:  No h/o cancer/immunosuppression:  No IV drug use:  No PMH of osteoporosis or chronic steroid use: No   Wt Readings from Last 3 Encounters:  03/11/17 150 lb 8 oz (68.3 kg)  01/08/17 149 lb (67.6 kg)  10/16/16 140 lb 9.6 oz (63.8 kg)   Review of Systems  Constitutional: Negative for chills, fatigue and fever.  Respiratory: Negative for cough, shortness of breath and wheezing.   Cardiovascular: Negative for chest pain and palpitations.  Gastrointestinal: Negative for abdominal pain.  Genitourinary: Negative for dysuria.  Musculoskeletal: Positive for back pain. Negative for myalgias.  Skin: Negative for pallor.  Neurological: Negative for weakness and numbness.  Psychiatric/Behavioral:       Denies depressed or anxious mood today   Past Medical History:  Diagnosis Date  . Anxiety   . Blood in stool   . Chicken pox   . Genital warts   . Kidney disease    per pt medical screening form     Social History   Socioeconomic History  . Marital status: Single    Spouse name: Not on file  . Number of children: Not on file  . Years of education: Not on file  . Highest education level: Not on file  Social Needs  . Financial resource strain: Not on file  . Food insecurity - worry: Not on file  . Food insecurity - inability: Not on file  . Transportation needs - medical: Not on file  .  Transportation needs - non-medical: Not on file  Occupational History  . Not on file  Tobacco Use  . Smoking status: Never Smoker  . Smokeless tobacco: Never Used  Substance and Sexual Activity  . Alcohol use: No    Alcohol/week: 0.0 oz  . Drug use: No  . Sexual activity: No  Other Topics Concern  . Not on file  Social History Narrative   Lives with twin sister   Work- [employer]    No pets    No children    Right handed    No caffeine daily- tea occasionally; eats chocolate    Enjoys- shopping and eating out     Past Surgical History:  Procedure Laterality Date  . COLONOSCOPY N/A 08/25/2014   Procedure: COLONOSCOPY;  Surgeon: Manya Silvas, MD;  Location: [facility] ENDOSCOPY;  Service: Endoscopy;  Laterality: N/A;  . endoscopy    . GUM SURGERY  2008  . laproscopy      Family History  Problem Relation Age of Onset  . Colon cancer Father 77  . Arthritis Father   . Cancer Father        colon/prostate  . Arthritis Mother   . Hyperlipidemia Mother   . Heart disease Mother   . Stroke Mother   . Hypertension Mother   . Kidney  disease Mother   . Diabetes Mother   . Stroke Maternal Grandmother   . Cancer Maternal Grandfather        prostate  . Stroke Paternal Grandfather   . Breast cancer Cousin 35    Allergies  Allergen Reactions  . Sertraline Diarrhea    Current Outpatient Medications on File Prior to Visit  Medication Sig Dispense Refill  . escitalopram (LEXAPRO) 10 MG tablet TAKE 1 TABLET BY MOUTH ONCE DAILY 30 tablet 3  . levonorgestrel-ethinyl estradiol (JOLESSA) 0.15-0.03 MG tablet Take 1 tablet daily by mouth. 1 Package 4  . lidocaine (LIDODERM) 5 % Place 1 patch onto the skin daily. Remove & Discard patch within 12 hours. (Patient not taking: Reported on 01/02/2017) 30 patch 0  . triamcinolone cream (KENALOG) 0.1 % Apply 1 application topically 2 (two) times daily. (Patient not taking: Reported on 01/02/2017) 30 g 0   No current  facility-administered medications on file prior to visit.     BP 100/78 (BP Location: Left Arm, Patient Position: Sitting, Cuff Size: Normal)   Pulse 76   Temp 97.9 F (36.6 C) (Oral)   Resp 18   Wt 150 lb 8 oz (68.3 kg)   SpO2 95%   BMI 22.88 kg/m        Objective:   Physical Exam  Constitutional: She is oriented to person, place, and time. She appears well-developed and well-nourished.  Eyes: Pupils are equal, round, and reactive to light. No scleral icterus.  Neck: Neck supple.  Cardiovascular: Normal rate, regular rhythm and intact distal pulses.  Pulmonary/Chest: Effort normal and breath sounds normal. She has no wheezes. She has no rales.  Abdominal: Soft. Bowel sounds are normal. There is no tenderness.  Musculoskeletal:  Spine with normal alignment and no deformity. No tenderness to vertebral process with palpation with the exception of mild tenderness in left buttock. Paraspinous muscles are minimally tender. ROM is full at lumbar sacral regions. Negative Straight Leg raise. No CVA tenderness present. Able to heel/toe walk without pain.  Lymphadenopathy:    She has no cervical adenopathy.  Neurological: She is alert and oriented to person, place, and time. She has normal strength.  Skin: Skin is warm and dry. No rash noted.  Psychiatric: She has a normal mood and affect. Her behavior is normal. Judgment and thought content normal.      Assessment & Plan:  1. Acute left-sided low back pain, with sciatica presence unspecified Symptoms wax and wane. Exam is reassuring; Discomfort noted in left buttock that may be related to sciatica. Patient has an orthopedic provider and has also completed PT for this pain that has provided benefit however symptom has returned. She has a follow up appointment with her orthopedic provider 03/26/17. Will trial a short course of diclofenac for symptom relief. Provided instructions related to sciatica.  Lab work 10/2016 is unremarkable. Patient  was concerned that this pain may be related to her kidneys as her mother has a history of kidney cancer. After review of lab work and imaging, of lumbar spine and nature of intermittent symptoms over a 2 year period, no evidence of weight loss or systemic symptoms, patient is reassured and would like to trial diclofenac for symptom relief.  Follow up with orthopedic provider as scheduled if symptoms do not improve, worsen, or new symptoms develop.  Delano Metz, FNP-C

## 2017-03-11 NOTE — Progress Notes (Signed)
Pre-visit discussion using our clinic review tool. No additional management support is needed unless otherwise documented below in the visit note.  

## 2017-03-11 NOTE — Patient Instructions (Signed)
Please take medication with food as directed.  If symptoms do not improve with treatment, please follow up with your orthopedic provider for further evaluation.  Sciatica Sciatica is pain, numbness, weakness, or tingling along your sciatic nerve. The sciatic nerve starts in the lower back and goes down the back of each leg. Sciatica happens when this nerve is pinched or has pressure put on it. Sciatica usually goes away on its own or with treatment. Sometimes, sciatica may keep coming back (recur). Follow these instructions at home: Medicines  Take over-the-counter and prescription medicines only as told by your doctor.  Do not drive or use heavy machinery while taking prescription pain medicine. Managing pain  If directed, put ice on the affected area. ? Put ice in a plastic bag. ? Place a towel between your skin and the bag. ? Leave the ice on for 20 minutes, 2-3 times a day.  After icing, apply heat to the affected area before you exercise or as often as told by your doctor. Use the heat source that your doctor tells you to use, such as a moist heat pack or a heating pad. ? Place a towel between your skin and the heat source. ? Leave the heat on for 20-30 minutes. ? Remove the heat if your skin turns bright red. This is especially important if you are unable to feel pain, heat, or cold. You may have a greater risk of getting burned. Activity  Return to your normal activities as told by your doctor. Ask your doctor what activities are safe for you. ? Avoid activities that make your sciatica worse.  Take short rests during the day. Rest in a lying or standing position. This is usually better than sitting to rest. ? When you rest for a long time, do some physical activity or stretching between periods of rest. ? Avoid sitting for a long time without moving. Get up and move around at least one time each hour.  Exercise and stretch regularly, as told by your doctor.  Do not lift  anything that is heavier than 10 lb (4.5 kg) while you have symptoms of sciatica. ? Avoid lifting heavy things even when you do not have symptoms. ? Avoid lifting heavy things over and over.  When you lift objects, always lift in a way that is safe for your body. To do this, you should: ? Bend your knees. ? Keep the object close to your body. ? Avoid twisting. General instructions  Use good posture. ? Avoid leaning forward when you are sitting. ? Avoid hunching over when you are standing.  Stay at a healthy weight.  Wear comfortable shoes that support your feet. Avoid wearing high heels.  Avoid sleeping on a mattress that is too soft or too hard. You might have less pain if you sleep on a mattress that is firm enough to support your back.  Keep all follow-up visits as told by your doctor. This is important. Contact a doctor if:  You have pain that: ? Wakes you up when you are sleeping. ? Gets worse when you lie down. ? Is worse than the pain you have had in the past. ? Lasts longer than 4 weeks.  You lose weight for without trying. Get help right away if:  You cannot control when you pee (urinate) or poop (have a bowel movement).  You have weakness in any of these areas and it gets worse. ? Lower back. ? Lower belly (pelvis). ? Butt (buttocks). ?  Legs.  You have redness or swelling of your back.  You have a burning feeling when you pee. This information is not intended to replace advice given to you by your health care provider. Make sure you discuss any questions you have with your health care provider. Document Released: 11/29/2007 Document Revised: 07/28/2015 Document Reviewed: 10/29/2014 Elsevier Interactive Patient Education  [employer].

## 2017-03-25 ENCOUNTER — Ambulatory Visit: Payer: Self-pay | Admitting: *Deleted

## 2017-03-25 NOTE — Telephone Encounter (Signed)
FYI

## 2017-03-25 NOTE — Telephone Encounter (Signed)
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; she also states that she "has sciatic pain and is getting a shot for that tomorrow at 1400 in [facility]"; per nurse triage recommendation made for pt to see physician within 3 days and care advice given; no appointments available at [facility]; pt offered an accepted appointment with Dr Damita Dunnings at [facility] 03/26/17 at [place]; pt verbalizes understanding;  will route to [place] and [facility] for notification of this encounter; please call pt if an appointment at [facility] becomes available; she can be contacted at [phone]; spoke with Fransisco Beau at [facility].    Reason for Disposition . [1] MODERATE pain (e.g., interferes with normal activities, limping) AND [2] present > 3 days  Answer Assessment - Initial Assessment Questions 1. ONSET: "When did the pain start?"      03/20/17 2. LOCATION: "Where is the pain located?"      The knee down on both legs; sometimes thighs hurt 3. PAIN: "How bad is the pain?"    (Scale 1-10; or mild, moderate, severe)   -  MILD (1-3): doesn't interfere with normal activities    -  MODERATE (4-7): interferes with normal activities (e.g., work or school) or awakens from sleep, limping    -  SEVERE (8-10): excruciating pain, unable to do any normal activities, unable to walk     Moderate to severe 4. WORK OR EXERCISE: "Has there been any recent work or exercise that involved this part of the body?"      no 5. CAUSE: "What do you think is causing the leg pain?"     unsure 6. OTHER SYMPTOMS: "Do you have any other symptoms?" (e.g., chest pain, back pain, breathing difficulty, swelling, rash, fever, numbness, weakness)     Swelling of both legs; left leg  weakness and right leg stiffness 7. PREGNANCY: "Is there any chance you are pregnant?" "When was your last menstrual period?" No medication for endometriosis  Protocols used: LEG PAIN-A-AH

## 2017-03-26 ENCOUNTER — Ambulatory Visit: Payer: BC Managed Care – PPO | Admitting: Family Medicine

## 2017-03-26 ENCOUNTER — Encounter: Payer: Self-pay | Admitting: Family Medicine

## 2017-03-26 VITALS — BP 124/74 | HR 67 | Temp 97.7°F | Wt 152.0 lb

## 2017-03-26 DIAGNOSIS — R252 Cramp and spasm: Secondary | ICD-10-CM

## 2017-03-26 LAB — BASIC METABOLIC PANEL
BUN: 20 mg/dL (ref 6–23)
CO2: 29 mEq/L (ref 19–32)
CREATININE: 0.7 mg/dL (ref 0.40–1.20)
Calcium: 9.3 mg/dL (ref 8.4–10.5)
Chloride: 104 mEq/L (ref 96–112)
GFR: 94.65 mL/min (ref >60.00)
Glucose, Bld: 81 mg/dL (ref 70–99)
POTASSIUM: 3.6 meq/L (ref 3.5–5.1)
Sodium: 138 mEq/L (ref 135–145)

## 2017-03-26 NOTE — Patient Instructions (Signed)
Likely cramping in the muscles in the lower legs related to standing.  Drink enough water to keep your urine clear or light colored.  Keep wearing compression stockings.  Go to the lab on the way out.  We'll contact you with your lab report. Get heel wedges and use those with your inserts.  See if that helps.   Take care.  Glad to see you.

## 2017-03-26 NOTE — Progress Notes (Signed)
With prolonged standing or occ brief periods she'll have B lower leg pain, symmetric, usually shin but can be in the calf B.  occ popliteal pain.  Started years ago but got worse about 1 week ago.  No trauma, no trigger.  She doesn't have pain on waking in the AM but some occ night pain after a hard day.  Generally better sitting down.  Some swelling occ.  None now since using compression stockings.    She had some back and hip pain and is getting an injection about that today but that seemed to be a separate issue.    No FCNAVD.  She doesn't feel unwell.    She tends to do better on the weekends, if less active.   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  On her feet a lot at work.    Meds, vitals, and allergies reviewed.   ROS: Per HPI unless specifically indicated in ROS section   nad ncat rrr ctab Ext w/o edema B high arch noted with normal DP pulse B Shin calf med/lat mal ankle feet not ttp B No rash, no bruising.  She has a symmetric B heavy lateral heel strike pattern on her shoe tread with midline anterior wear B.

## 2017-03-28 ENCOUNTER — Encounter: Payer: Self-pay | Admitting: *Deleted

## 2017-04-16 ENCOUNTER — Ambulatory Visit: Payer: Self-pay | Admitting: *Deleted

## 2017-04-16 NOTE — Telephone Encounter (Signed)
Pt called stating that she had side effects from the Voltaren she was on. She was started on Jan 7 and now has been finished for about 1.5 weeks.  She stated that she had had some chest discomfort which she attributed to this medicine along with headaches, jerking hands, feet, arms and legs and swelling in her face. She states her face has gone down now. No other symptoms now. She just had a question regarding this med. Advice given with verbal understanding, per protocol. She will call back if symptoms come back. Pt voiced understanding.  Reason for Disposition . Caller has medication question only, adult not sick, and triager answers question  Answer Assessment - Initial Assessment Questions 1. SYMPTOMS: "Do you have any symptoms?"     Chest discomfort, like acid reflux, swelling in face, tremors in arms, legs, feet and hands 2. SEVERITY: If symptoms are present, ask "Are they mild, moderate or severe?"     mild  Protocols used: MEDICATION QUESTION CALL-A-AH

## 2017-04-23 ENCOUNTER — Ambulatory Visit: Payer: BC Managed Care – PPO | Admitting: Family

## 2017-04-24 ENCOUNTER — Encounter: Payer: Self-pay | Admitting: Family

## 2017-04-24 ENCOUNTER — Ambulatory Visit: Payer: BC Managed Care – PPO | Admitting: Family

## 2017-04-24 VITALS — BP 118/82 | HR 65 | Temp 98.0°F | Resp 16 | Wt 155.0 lb

## 2017-04-24 DIAGNOSIS — R519 Headache, unspecified: Secondary | ICD-10-CM

## 2017-04-24 DIAGNOSIS — R51 Headache: Secondary | ICD-10-CM | POA: Diagnosis not present

## 2017-04-24 DIAGNOSIS — R635 Abnormal weight gain: Secondary | ICD-10-CM

## 2017-04-24 DIAGNOSIS — J029 Acute pharyngitis, unspecified: Secondary | ICD-10-CM | POA: Diagnosis not present

## 2017-04-24 DIAGNOSIS — F419 Anxiety disorder, unspecified: Secondary | ICD-10-CM

## 2017-04-24 MED ORDER — ESCITALOPRAM OXALATE 10 MG PO TABS: 10.0000 mg | ORAL_TABLET | Freq: Every day | ORAL | 3 refills | Status: DC

## 2017-04-24 NOTE — Assessment & Plan Note (Signed)
Reassured by normal neurologic exam. No headache today. Discussed likely multifactorial- lexapro, prior use of voltaren PO, eye strain ( cataract). We will trial coming off of lexapro. Patient will let me know how she is doing.

## 2017-04-24 NOTE — Assessment & Plan Note (Signed)
17 pounds in last year. ? cortizone injections (2) contributory.  Discussed increasing exercise regimen. Will follow

## 2017-04-24 NOTE — Assessment & Plan Note (Signed)
Taper off lexapro. 25% experience HA on medication. Patient will let me know if HA's improve. At that time, we may trial another SSRI. Not zoloft as she had diarrhea with this medication.

## 2017-04-24 NOTE — Assessment & Plan Note (Signed)
Well appearing. Improving. Suspect viral v allergic etiology. Patient will let me know if doesn't continue to improve.

## 2017-04-24 NOTE — Progress Notes (Signed)
Subjective:    Patient ID: Margaret Mcfarland, female    DOB: 04-25-1970, 47 y.o.   MRN: [id number]  CC: Margaret Mcfarland is a 47 y.o. female who presents today for follow up.   HPI: Multiple complaints.    sore throat x 2 days, better today. Endorses cough, myalgias ( improving as well).  No fever, sob, wheezing, sinus pressure.  Thinks allergies may be contributory. No known strep exposure. Not around children.   She also complains of headache, describes on 'top of head and over both eyes.' No HA today. "felt good today.' HA last for 2-3 days. Usually starts 2-3 hours after waking up. May 'ease after couple of hours' on its own.   Cold temperature ( when goes into freezer at work) will trigger HA.    Wanders if HA is coming from voltaren PO, stopped 3 weeks ago- HA's havent' really changed since stopping. No caffeine, sleeping well. No stressors. Tried zoloft and had diarrhea.    Has a cataract in left eye and affects visual acuity;  plans to have removed 06/2017. No blackness in vision, floaters, spots. Told that cataract is related to severity of retinal detachment.    H/o retinal detachment 08/2016- s/p surgery.   Noticed weight gain in face. Has gained 17 pounds in past year; has had two steriod injections in past year. On walking program for 15 minutes most days    HISTORY:  Past Medical History:  Diagnosis Date  . Anxiety   . Blood in stool   . Chicken pox   . Genital warts   . Kidney disease    per pt medical screening form   Past Surgical History:  Procedure Laterality Date  . COLONOSCOPY N/A 08/25/2014   Procedure: COLONOSCOPY;  Surgeon: Manya Silvas, MD;  Location: [facility] ENDOSCOPY;  Service: Endoscopy;  Laterality: N/A;  . endoscopy    . GUM SURGERY  2008  . laproscopy     Family History  Problem Relation Age of Onset  . Colon cancer Father 72  . Arthritis Father   . Cancer Father        colon/prostate  . Arthritis Mother   . Hyperlipidemia Mother   . Heart disease  Mother   . Stroke Mother   . Hypertension Mother   . Kidney disease Mother   . Diabetes Mother   . Stroke Maternal Grandmother   . Cancer Maternal Grandfather        prostate  . Stroke Paternal Grandfather   . Breast cancer Cousin 35    Allergies: Sertraline Current Outpatient Medications on File Prior to Visit  Medication Sig Dispense Refill  . levonorgestrel-ethinyl estradiol (JOLESSA) 0.15-0.03 MG tablet Take 1 tablet daily by mouth. 1 Package 4   No current facility-administered medications on file prior to visit.     Social History   Tobacco Use  . Smoking status: Never Smoker  . Smokeless tobacco: Never Used  Substance Use Topics  . Alcohol use: No    Alcohol/week: 0.0 oz  . Drug use: No    Review of Systems  Constitutional: Negative for chills and fever.  HENT: Positive for sore throat. Negative for congestion, sinus pain, trouble swallowing and voice change.   Eyes: Positive for visual disturbance. Negative for photophobia.  Respiratory: Positive for cough. Negative for shortness of breath and wheezing.   Cardiovascular: Negative for chest pain and palpitations.  Gastrointestinal: Negative for nausea and vomiting.  Genitourinary: Positive for frequency.  Neurological: Positive for  headaches. Negative for tremors, seizures, syncope and weakness.      Objective:    BP 118/82 (BP Location: Left Arm, Patient Position: Sitting, Cuff Size: Normal)   Pulse 65   Temp 98 F (36.7 C) (Oral)   Resp 16   Wt 155 lb (70.3 kg)   SpO2 98%   BMI 23.57 kg/m  BP Readings from Last 3 Encounters:  04/24/17 118/82  03/26/17 124/74  03/11/17 100/78   Wt Readings from Last 3 Encounters:  04/24/17 155 lb (70.3 kg)  03/26/17 152 lb (68.9 kg)  03/11/17 150 lb 8 oz (68.3 kg)    Physical Exam  Constitutional: She appears well-developed and well-nourished.  HENT:  Head: Normocephalic and atraumatic.  Right Ear: Hearing, tympanic membrane, external ear and ear canal  normal. No swelling or tenderness. Tympanic membrane is not erythematous and not bulging. No middle ear effusion.  Left Ear: Tympanic membrane, external ear and ear canal normal. No swelling or tenderness. Tympanic membrane is not erythematous and not bulging.  No middle ear effusion.  Nose: Nose normal. No rhinorrhea. Right sinus exhibits no maxillary sinus tenderness and no frontal sinus tenderness. Left sinus exhibits no maxillary sinus tenderness and no frontal sinus tenderness.  Mouth/Throat: Uvula is midline, oropharynx is clear and moist and mucous membranes are normal. No posterior oropharyngeal edema or posterior oropharyngeal erythema.  No exudate. Tonsils behind pilars  Eyes: Conjunctivae, EOM and lids are normal. Pupils are equal, round, and reactive to light. Lids are everted and swept, no foreign bodies found.  Normal fundus bilaterally   Cardiovascular: Normal rate, regular rhythm, normal heart sounds and normal pulses.  Pulmonary/Chest: Effort normal and breath sounds normal. She has no wheezes. She has no rhonchi. She has no rales.  Lymphadenopathy:       Head (right side): No submental, no submandibular, no tonsillar, no preauricular, no posterior auricular and no occipital adenopathy present.       Head (left side): No submental, no submandibular, no tonsillar, no preauricular, no posterior auricular and no occipital adenopathy present.    She has no cervical adenopathy.       Right cervical: No superficial cervical, no deep cervical and no posterior cervical adenopathy present.      Left cervical: No superficial cervical, no deep cervical and no posterior cervical adenopathy present.  Neurological: She is alert. She has normal strength. No cranial nerve deficit or sensory deficit. She displays a negative Romberg sign.  Reflex Scores:      Bicep reflexes are 2+ on the right side and 2+ on the left side.      Patellar reflexes are 2+ on the right side and 2+ on the left  side. Grip equal and strong bilateral upper extremities. Gait strong and steady. Able to perform  finger-to-nose without difficulty.   Skin: Skin is warm and dry.  Psychiatric: She has a normal mood and affect. Her speech is normal and behavior is normal. Thought content normal.  Vitals reviewed.      Assessment & Plan:   Problem List Items Addressed This Visit      Other   Anxiety - Primary    Taper off lexapro. 25% experience HA on medication. Patient will let me know if HA's improve. At that time, we may trial another SSRI. Not zoloft as she had diarrhea with this medication.       Relevant Medications   escitalopram (LEXAPRO) 10 MG tablet   Headache    Reassured by  normal neurologic exam. No headache today. Discussed likely multifactorial- lexapro, prior use of voltaren PO, eye strain ( cataract). We will trial coming off of lexapro. Patient will let me know how she is doing.       Relevant Medications   escitalopram (LEXAPRO) 10 MG tablet   Sore throat    Well appearing. Improving. Suspect viral v allergic etiology. Patient will let me know if doesn't continue to improve.       Weight gain    17 pounds in last year. ? cortizone injections (2) contributory.  Discussed increasing exercise regimen. Will follow          I have discontinued Nikolina Garcialopez's diclofenac. I have also changed her escitalopram. Additionally, I am having her maintain her levonorgestrel-ethinyl estradiol.   Meds ordered this encounter  Medications  . escitalopram (LEXAPRO) 10 MG tablet    Sig: Take 1 tablet (10 mg total) by mouth daily.    Dispense:  30 tablet    Refill:  3    Please consider 90 day supplies to promote better adherence    Order Specific Question:   Supervising Provider    Answer:   Crecencio Mc [[id number]]    Return precautions given.   Risks, benefits, and alternatives of the medications and treatment plan prescribed today were discussed, and patient expressed understanding.    Education regarding symptom management and diagnosis given to patient on AVS.  Continue to follow with Burnard Hawthorne, FNP for routine health maintenance.   Godfrey Pick and I agreed with plan.   Mable Paris, FNP

## 2017-04-24 NOTE — Patient Instructions (Addendum)
Taper lexapro. Take every other day for one week, then second week, take every third day for one week, and then stop.   Salt water gargles - suspect viral sore throat. Let me know if not better  Walking program to weight gain.  F/u 3 months

## 2017-04-29 ENCOUNTER — Telehealth: Payer: Self-pay

## 2017-04-29 NOTE — Telephone Encounter (Signed)
appt with [facility] 2/26  noted

## 2017-04-29 NOTE — Telephone Encounter (Signed)
Reason for call: Symptoms: cough non productive, no fever, throat is better, achy at times , congested, stomach pain, center, nausea, at times feels like going to pass out at times , symptoms not present all the time. Patient advised not to drive due to syncope episodes. ? Related to coming of  lexapro side effects  Advised to get evaluated today but prefers to wait until tomorrow.  Duration Medications: Last seen for this problem: Seen by:

## 2017-04-29 NOTE — Telephone Encounter (Signed)
Copied from [place]. Topic: General - Other >> Apr 29, 2017 12:49 PM Yvette Rack wrote: Reason for CRM: patient states that she is no better from when she was in on 04-24-17 she complains of congested nose cough sweats nausea with some headaches that started on Friday on out the weekend with stomach pain red in the face to where it feels like she will pass out patient would like a call back

## 2017-04-30 ENCOUNTER — Ambulatory Visit: Payer: BC Managed Care – PPO | Admitting: Family Medicine

## 2017-05-01 ENCOUNTER — Ambulatory Visit: Payer: BC Managed Care – PPO | Admitting: Family Medicine

## 2017-05-01 ENCOUNTER — Encounter: Payer: Self-pay | Admitting: Family Medicine

## 2017-05-01 VITALS — BP 120/80 | HR 63 | Temp 98.4°F | Wt 155.0 lb

## 2017-05-01 DIAGNOSIS — J019 Acute sinusitis, unspecified: Secondary | ICD-10-CM | POA: Diagnosis not present

## 2017-05-01 MED ORDER — AMOXICILLIN-POT CLAVULANATE 875-125 MG PO TABS: 1.0000 | ORAL_TABLET | Freq: Two times a day (BID) | ORAL | 0 refills | Status: DC

## 2017-05-01 MED ORDER — FLUTICASONE PROPIONATE 50 MCG/ACT NA SUSP: 2.0000 | Freq: Every day | NASAL | 6 refills | Status: DC

## 2017-05-01 NOTE — Progress Notes (Signed)
BP 120/80 (BP Location: Left Arm, Patient Position: Sitting, Cuff Size: Normal)   Pulse 63   Temp 98.4 F (36.9 C) (Oral)   Wt 155 lb (70.3 kg)   LMP 01/15/2017   SpO2 97%   BMI 23.57 kg/m    CC: abd pain, headache Subjective:    Patient ID: Margaret Mcfarland, female    DOB: April 08, 1970, 47 y.o.   MRN: [id number]  HPI: Margaret Mcfarland is a 47 y.o. female presenting on 05/01/2017 for Abdominal Pain (Located in epigastric area. Pain is intermittent, achy. Started about 4 days ago. Pt is accompained by sister, Margaret Mcfarland.) and Headache (Feels like sinuns pressure/pain. Some hoarseness. Started months ago. Tried OTC sinus meds.)   Here with twin sister Margaret Mcfarland.   9d h/o ST with PNdrainage, hoarseness, body aches, headache over top of head, head congestion, nonproductive cough. Endorses chills, diaphoresis, malaise. Epigastric discomfort started 3-4 days ago associated with intermittent nausea which seems to be improving.  Overall feeling worse. Sore throat better, persistent hoarseness. Some lightheadedness.  Denies fevers, ear or tooth pain, vomiting, diarrhea, constipation. No GERD symptoms.  Ongoing headache for several months - may be related to eye strain given recent retinal detachment with deterioration of vision of left eye.   Sister sick as well. Non smoker No h/o asthma. So far has tried OTC sinus relief medicine as well as antihistamine.   Saw PCP earlier 04/2017, note reviewed. ST, cough, myalgias, for several days thought allergic vs viral. Headache present for months, last 2-3 days. Last visit she underwent lexapro taper in hopes of improvement in headache. No better.   Relevant past medical, surgical, family and social history reviewed and updated as indicated. Interim medical history since our last visit reviewed. Allergies and medications reviewed and updated. Outpatient Medications Prior to Visit  Medication Sig Dispense Refill  . escitalopram (LEXAPRO) 10 MG tablet Take 1 tablet (10  mg total) by mouth daily. 30 tablet 3  . levonorgestrel-ethinyl estradiol (JOLESSA) 0.15-0.03 MG tablet Take 1 tablet daily by mouth. 1 Package 4   No facility-administered medications prior to visit.      Per HPI unless specifically indicated in ROS section below Review of Systems     Objective:    BP 120/80 (BP Location: Left Arm, Patient Position: Sitting, Cuff Size: Normal)   Pulse 63   Temp 98.4 F (36.9 C) (Oral)   Wt 155 lb (70.3 kg)   LMP 01/15/2017   SpO2 97%   BMI 23.57 kg/m   Wt Readings from Last 3 Encounters:  05/01/17 155 lb (70.3 kg)  04/24/17 155 lb (70.3 kg)  03/26/17 152 lb (68.9 kg)    Physical Exam  Constitutional: She appears well-developed and well-nourished. No distress.  HENT:  Head: Normocephalic and atraumatic.  Right Ear: Hearing, tympanic membrane, external ear and ear canal normal.  Left Ear: Hearing, tympanic membrane, external ear and ear canal normal.  Nose: Mucosal edema present. No rhinorrhea. Right sinus exhibits no maxillary sinus tenderness and no frontal sinus tenderness. Left sinus exhibits no maxillary sinus tenderness and no frontal sinus tenderness.  Mouth/Throat: Uvula is midline, oropharynx is clear and moist and mucous membranes are normal. No oropharyngeal exudate, posterior oropharyngeal edema, posterior oropharyngeal erythema or tonsillar abscesses.  Eyes: Conjunctivae and EOM are normal. Pupils are equal, round, and reactive to light. No scleral icterus.  Neck: Normal range of motion. Neck supple.  Cardiovascular: Normal rate, regular rhythm, normal heart sounds and intact distal pulses.  No murmur  heard. Pulmonary/Chest: Effort normal and breath sounds normal. No respiratory distress. She has no wheezes. She has no rales.  Lungs clear  Lymphadenopathy:    She has no cervical adenopathy.  Skin: Skin is warm and dry. No rash noted.  Nursing note and vitals reviewed.     Assessment & Plan:   Problem List Items Addressed  This Visit    Acute sinusitis - Primary    Possibly initially flu however now anticipate bacterial sinusitis given duration and progression of symptoms. Rx augmentin 10d course. rec yogurt towards end of course. May also use flonase, mucinex. Update if not improving with treatment. Pt agrees with plan.       Relevant Medications   amoxicillin-clavulanate (AUGMENTIN) 875-125 MG tablet   fluticasone (FLONASE) 50 MCG/ACT nasal spray       Meds ordered this encounter  Medications  . amoxicillin-clavulanate (AUGMENTIN) 875-125 MG tablet    Sig: Take 1 tablet by mouth 2 (two) times daily for 10 days.    Dispense:  20 tablet    Refill:  0  . fluticasone (FLONASE) 50 MCG/ACT nasal spray    Sig: Place 2 sprays into both nostrils daily.    Dispense:  16 g    Refill:  6   No orders of the defined types were placed in this encounter.   Follow up plan: Return if symptoms worsen or fail to improve.  Ria Bush, MD

## 2017-05-01 NOTE — Assessment & Plan Note (Addendum)
Possibly initially flu however now anticipate bacterial sinusitis given duration and progression of symptoms. Rx augmentin 10d course. rec yogurt towards end of course. May also use flonase, mucinex. Update if not improving with treatment. Pt agrees with plan.

## 2017-05-01 NOTE — Telephone Encounter (Signed)
Courtesy call-  I dont see she was seen by Damita Dunnings?  Does she want to make an appt with [place]?

## 2017-05-01 NOTE — Patient Instructions (Signed)
I think you may have bacterial sinus infection. Take medicine as prescribed: augmentin 10 day course.  Push fluids and plenty of rest. Nasal saline irrigation or neti pot to help drain sinuses.  Try flonase nasal steroid sent to pharmacy.  May use plain mucinex with plenty of fluid to help mobilize mucous. Please let us know if fever >101.5, trouble opening/closing mouth, difficulty swallowing, or worsening instead of improving as expected.

## 2017-05-03 NOTE — Telephone Encounter (Signed)
Patient was seen by Dr Leo Grosser

## 2017-05-06 DIAGNOSIS — R195 Other fecal abnormalities: Secondary | ICD-10-CM | POA: Insufficient documentation

## 2017-05-06 DIAGNOSIS — K5909 Other constipation: Secondary | ICD-10-CM | POA: Insufficient documentation

## 2017-05-06 DIAGNOSIS — K602 Anal fissure, unspecified: Secondary | ICD-10-CM | POA: Insufficient documentation

## 2017-05-10 ENCOUNTER — Telehealth: Payer: Self-pay | Admitting: Family Medicine

## 2017-05-10 NOTE — Telephone Encounter (Signed)
Left message on vm per dpr relaying message and instructions per Dr. G. 

## 2017-05-10 NOTE — Telephone Encounter (Signed)
I think augmentin course should have taken care of any bacterial infection. Sounds like she has persistent allergic rhinitis symptoms. Ensure she's taking her flonase, 2 squirts into each nostril daily. Would have her add daily antihistamine like zyrtec, allegra, claritin. Update [place] if not helping.

## 2017-05-10 NOTE — Telephone Encounter (Signed)
Copied from [place] [id number]. Topic: Inquiry >> May 10, 2017  8:07 AM Margot Ables wrote: Pt called in stating she still has a runny nose, sore throat, sneezing, and head congestion. Pt has completed amoxicillin from 05/01/17 Dr. Danise Mina. Please advise.

## 2017-05-20 NOTE — Telephone Encounter (Signed)
Pt states she has been taking zyrtec since Saturday, 3/16 and the nasal spray since the dr prescribed on 2/27 and she is not better . Also wants to know if the nasal spray is safe to do everyday, longer than a week or more   Would like to know what else she can do?

## 2017-05-20 NOTE — Telephone Encounter (Signed)
I'm glad she's taking the zyrtec daily. Continue.  Flonase intranasal steroid is safe to take daily, just needs to watch for increased risk of nosebleeds on it. Also recommend she avoid allergens - carry nasal saline with her and use regularly throughout the day to wash away any allergens she is exposed to. If she's outdoors for prolonged period of time, try to take a shower as soon as she gets indoors - again to wash away allergens.  If no improvement despite all these measures, would suggest return with PCP for further eval.  (singulair?)

## 2017-05-21 NOTE — Telephone Encounter (Signed)
Left message on vm per dpr relaying message and instructions per Dr. G. 

## 2017-05-23 ENCOUNTER — Ambulatory Visit: Payer: BC Managed Care – PPO | Admitting: Family Medicine

## 2017-05-27 ENCOUNTER — Encounter: Payer: Self-pay | Admitting: Family Medicine

## 2017-05-27 ENCOUNTER — Ambulatory Visit: Payer: BC Managed Care – PPO | Admitting: Family Medicine

## 2017-05-27 VITALS — BP 112/80 | HR 71 | Temp 97.5°F | Ht 68.0 in | Wt 156.0 lb

## 2017-05-27 DIAGNOSIS — G44219 Episodic tension-type headache, not intractable: Secondary | ICD-10-CM

## 2017-05-27 NOTE — Progress Notes (Signed)
Patient ID: Keimani Laufer, female   DOB: [birth year], 47 y.o.   MRN: [id number]  PCP: Burnard Hawthorne, FNP  Subjective:  Daysha Ashmore is a 48 y.o. year old very pleasant female patient who presents with her twin sister today. She is here for evaluation of a Headache that occurs intermittently.  -started: 3 to 4 weeks , symptoms are not worsening,  -Aura: No -Pain: 2 Quality: aching Site: "top of head and over both eyes"  Radiation: No -Duration:  Unknown per patient.  -Trigger: No known other than eye strain with decreased vision, anxiety -Trauma: No -Stressors: No new stressors, history of anxiety -Change in Birth Control: No -Environmental Factors: No -Relation to Food/Alcohol: No -Nausea/Vomiting: No -Fever: No -Weight Loss: No -History of CA/immunosuppresion: No -Visual Changes: Decreased vision Left eye: 20/200  Right eye: 20/20 per patient report -Seizures: No  History of retinal detachment 08/2016-s/p surgery. She also has a cataract on her left eye and is scheduled for surgery 06/26/17. No caffeine intake. Decreased water intake noted.   -previous treatments: Intermittent use of a "headache pill" that has provided limited benefit. She does not remember name of "headache pill" -sick contacts/travel/risks: No -Family Hx of headache: No  ROS-denies fever, chills, thunderclap HA, sinus pressure/pain, ear pain,   Pertinent Past Medical History- Anxiety   Medications- reviewed  Current Outpatient Medications  Medication Sig Dispense Refill  . levonorgestrel-ethinyl estradiol (JOLESSA) 0.15-0.03 MG tablet Take 1 tablet daily by mouth. 1 Package 4   No current facility-administered medications for this visit.     Objective: BP 112/80 (BP Location: Right Arm, Patient Position: Sitting, Cuff Size: Normal)   Pulse 71   Temp (!) 97.5 F (36.4 C) (Oral)   Ht 5\' 8"  (1.727 m)   Wt 156 lb (70.8 kg)   BMI 23.72 kg/m  Gen: NAD, resting comfortably HEENT: Oropharynx clear and  moist CV: RRR no murmurs rubs or gallops Lungs: CTAB no crackles, wheeze, rhonchi Abdomen: soft/nontender/nondistended/normal bowel sounds. No rebound or guarding.  Ext: no edema Skin: warm, dry, no rash Neuro: II-Visual fields grossly intact. III/IV/VI-Extraocular movements intact. Pupils reactive bilaterally. V/VII-Smile symmetric, equal eyebrow raise, facial sensation intact VIII- Hearing grossly intact XI-bilateral shoulder shrug XII-midline tongue extension Motor: 5/5 bilaterally with normal tone and bulk Cerebellar: Normal finger-to-nose and normal heel-to-shin test.  Romberg negative Ambulates with a coordinated gait  Assessment/Plan:  Headache: History and exam today are suggestive of HA that is multifactorial in nature. History of anxiety present; eye strain (cataract), recent taper off of Lexapro, and decreased water intake may be contributing factors. Patient appears anxious today and is planning to follow up with PCP to discuss options for anxiety as she has completed taper off of Lexapro. Neuro exam is reassuring today.   We discussed that exam and history are reassuring.  Will trial short course of ibuprofen 600 mg every 6 hours and also increasing water intake; advised her to drink enough water to keep urine pale yellow or clear. Advised her to follow up with ophthalmology as scheduled and communicate use of ibuprofen with them when planning cataract surgery. Advised close follow up if treatment does not improve symptoms and if she is treating a headache more than 10 days a month. Reviewed the importance of assessing for rebound HAs that can occur with 10 treatments/month.   She will schedule a follow up appointment in one month with PCP for evaluation of anxiety and headache or sooner if needed.  Finally, we reviewed reasons to  return to care including if symptoms worsen or persist or new concerns arise- once again particularly worsening headaches, fever, N/V, or no  improvement with treatment.   Laurita Quint, FNP

## 2017-05-27 NOTE — Patient Instructions (Addendum)
Please drink plenty of water; enough to keep urine pale yellow or clear. You may use ibuprofen 600 mg every 6 hours with food for symptom relief. If you are treating a symptom of a headache more than 10 days in a month, please follow up with Margaret Mcfarland for further evaluation.  Also, schedule a follow up appointment with Margaret Mcfarland in the next month.  Feel better soon!   General Headache Without Cause A headache is pain or discomfort felt around the head or neck area. There are many causes and types of headaches. In some cases, the cause may not be found. Follow these instructions at home: Managing pain  Take over-the-counter and prescription medicines only as told by your doctor.  Lie down in a dark, quiet room when you have a headache.  If directed, apply ice to the head and neck area: ? Put ice in a plastic bag. ? Place a towel between your skin and the bag. ? Leave the ice on for 20 minutes, 2-3 times per day.  Use a heating pad or hot shower to apply heat to the head and neck area as told by your doctor.  Keep lights dim if bright lights bother you or make your headaches worse. Eating and drinking  Eat meals on a regular schedule.  Lessen how much alcohol you drink.  Lessen how much caffeine you drink, or stop drinking caffeine. General instructions  Keep all follow-up visits as told by your doctor. This is important.  Keep a journal to find out if certain things bring on headaches. For example, write down: ? What you eat and drink. ? How much sleep you get. ? Any change to your diet or medicines.  Relax by getting a massage or doing other relaxing activities.  Lessen stress.  Sit up straight. Do not tighten (tense) your muscles.  Do not use tobacco products. This includes cigarettes, chewing tobacco, or e-cigarettes. If you need help quitting, ask your doctor.  Exercise regularly as told by your doctor.  Get enough sleep. This often means 7-9 hours of  sleep. Contact a doctor if:  Your symptoms are not helped by medicine.  You have a headache that feels different than the other headaches.  You feel sick to your stomach (nauseous) or you throw up (vomit).  You have a fever. Get help right away if:  Your headache becomes really bad.  You keep throwing up.  You have a stiff neck.  You have trouble seeing.  You have trouble speaking.  You have pain in the eye or ear.  Your muscles are weak or you lose muscle control.  You lose your balance or have trouble walking.  You feel like you will pass out (faint) or you pass out.  You have confusion. This information is not intended to replace advice given to you by your health care provider. Make sure you discuss any questions you have with your health care provider. Document Released: 11/29/2007 Document Revised: 07/28/2015 Document Reviewed: 06/14/2014 Elsevier Interactive Patient Education  [employer].

## 2017-05-29 ENCOUNTER — Telehealth: Payer: Self-pay | Admitting: Family

## 2017-05-29 ENCOUNTER — Telehealth: Payer: Self-pay

## 2017-05-29 NOTE — Telephone Encounter (Signed)
Left voicemail for patient to call office back

## 2017-05-29 NOTE — Telephone Encounter (Addendum)
Copied from [place] [phone]. Topic: Quick Communication - See Telephone Encounter >> May 29, 2017  3:10 PM Ivar Drape wrote: CRM for notification. See Telephone encounter for: 05/29/17. Patient stated she is experiencing stomach pain and her stools have been black and green for a week. She doesn't think it has anything to do with her eye problems. Please advise.  If she does not answer, leave a message on the voicemail.

## 2017-05-29 NOTE — Telephone Encounter (Signed)
Called to triage patient but I received a busy signal and was not able to leave a message. Will try to call back.

## 2017-05-29 NOTE — Telephone Encounter (Signed)
Patient returned call and states that she is having blackish / green stools with abdominal pain located in the middle of her abdomen. I suggested to patient that she go to the ED. Patient verbalized understanding.  Copied from [place] [phone]. Topic: Quick Communication - See Telephone Encounter >> May 29, 2017  3:10 PM Ivar Drape wrote: CRM for notification. See Telephone encounter for: 05/29/17. >> May 29, 2017  4:16 PM Percell Belt A wrote: Pt called back in, she was returning phone call

## 2017-05-30 ENCOUNTER — Emergency Department
Admission: EM | Admit: 2017-05-30 | Discharge: 2017-05-30 | Disposition: A | Discharge status: Home / Self Care | Payer: BC Managed Care – PPO | Attending: Emergency Medicine | Admitting: Emergency Medicine

## 2017-05-30 ENCOUNTER — Other Ambulatory Visit: Payer: Self-pay

## 2017-05-30 DIAGNOSIS — R109 Unspecified abdominal pain: Secondary | ICD-10-CM

## 2017-05-30 DIAGNOSIS — R51 Headache: Secondary | ICD-10-CM | POA: Insufficient documentation

## 2017-05-30 DIAGNOSIS — K921 Melena: Secondary | ICD-10-CM | POA: Insufficient documentation

## 2017-05-30 DIAGNOSIS — Z79899 Other long term (current) drug therapy: Secondary | ICD-10-CM | POA: Insufficient documentation

## 2017-05-30 DIAGNOSIS — R1033 Periumbilical pain: Secondary | ICD-10-CM | POA: Insufficient documentation

## 2017-05-30 DIAGNOSIS — R42 Dizziness and giddiness: Secondary | ICD-10-CM | POA: Diagnosis not present

## 2017-05-30 HISTORY — DX: Serous retinal detachment, unspecified eye: H33.20

## 2017-05-30 HISTORY — DX: Unspecified cataract: H26.9

## 2017-05-30 LAB — CBC
HCT: 41.7 % (ref 35.0–47.0)
Hemoglobin: 13.7 g/dL (ref 12.0–16.0)
MCH: 30.9 pg (ref 26.0–34.0)
MCHC: 32.9 g/dL (ref 32.0–36.0)
MCV: 94 fL (ref 80.0–100.0)
PLATELETS: 247 10*3/uL (ref 150–440)
RBC: 4.43 MIL/uL (ref 3.80–5.20)
RDW: 12.6 % (ref 11.5–14.5)
WBC: 6.5 10*3/uL (ref 3.6–11.0)

## 2017-05-30 LAB — URINALYSIS, COMPLETE (UACMP) WITH MICROSCOPIC
Bilirubin Urine: NEGATIVE
GLUCOSE, UA: NEGATIVE mg/dL
Ketones, ur: NEGATIVE mg/dL
Leukocytes, UA: NEGATIVE
Nitrite: NEGATIVE
PH: 7 (ref 5.0–8.0)
Protein, ur: NEGATIVE mg/dL
SPECIFIC GRAVITY, URINE: 1.008 (ref 1.005–1.030)

## 2017-05-30 LAB — COMPREHENSIVE METABOLIC PANEL
ALBUMIN: 4 g/dL (ref 3.5–5.0)
ALK PHOS: 28 U/L — AB (ref 38–126)
ALT: 16 U/L (ref 14–54)
AST: 19 U/L (ref 15–41)
Anion gap: 8 (ref 5–15)
BUN: 13 mg/dL (ref 6–20)
CALCIUM: 9.4 mg/dL (ref 8.9–10.3)
CO2: 27 mmol/L (ref 22–32)
CREATININE: 0.69 mg/dL (ref 0.44–1.00)
Chloride: 104 mmol/L (ref 101–111)
GFR calc Af Amer: >60 mL/min (ref >60)
GLUCOSE: 103 mg/dL — AB (ref 65–99)
Potassium: 3.4 mmol/L — ABNORMAL LOW (ref 3.5–5.1)
Sodium: 139 mmol/L (ref 135–145)
Total Bilirubin: 0.6 mg/dL (ref 0.3–1.2)
Total Protein: 7.2 g/dL (ref 6.5–8.1)

## 2017-05-30 LAB — LIPASE, BLOOD: Lipase: 44 U/L (ref 11–51)

## 2017-05-30 NOTE — ED Provider Notes (Signed)
[facility] Emergency Department Provider Note  ___________________________________________   First MD Initiated Contact with Patient 05/30/17 2008     (approximate)  I have reviewed the triage vital signs and the nursing notes.   HISTORY  Chief Complaint Abdominal Pain and Headache   HPI Margaret Mcfarland is a 47 y.o. female history of blood in her stool from what she describes as a "tear" who is presenting to the emergency department today with dark stool over the past 4 weeks in addition to the abdominal cramping that is periumbilical and intermittent feelings of lightheadedness.  She says that she also has a very mild headache at this time which is a 2 out of 10, frontal and dull.  Says the headache has been ongoing for the past 3 weeks and her primary care doctor has attributed to this to blurred vision in her left eye from retinal detachment this past June.  The patient describes the abdominal pain is cramping and intermittent.  Called her GI office today and was told to present to the emergency department for further evaluation.  Denies any diarrhea.  Not reporting any fever.  Past Medical History:  Diagnosis Date  . Anxiety   . Blood in stool   . Cataract   . Chicken pox   . Genital warts   . Kidney disease    per pt medical screening form  . Retinal detachment     Patient Active Problem List   Diagnosis Date Noted  . Acute sinusitis 05/01/2017  . Headache 04/24/2017  . Weight gain 04/24/2017  . Encounter for medical examination to establish care 10/16/2016  . Chronic left-sided low back pain without sciatica 10/16/2016  . Chest pain 10/16/2016  . Anxiety 07/18/2016  . Poison ivy dermatitis 07/18/2016  . Missed period 07/18/2016  . Purpura ([place]) 07/06/2015  . B12 deficiency 08/16/2014    Past Surgical History:  Procedure Laterality Date  . COLONOSCOPY N/A 08/25/2014   Procedure: COLONOSCOPY;  Surgeon: Manya Silvas, MD;  Location: [facility]  ENDOSCOPY;  Service: Endoscopy;  Laterality: N/A;  . endoscopy    . GUM SURGERY  2008  . laproscopy      Prior to Admission medications   Medication Sig Start Date End Date Taking? Authorizing Provider  levonorgestrel-ethinyl estradiol (JOLESSA) 0.15-0.03 MG tablet Take 1 tablet daily by mouth. 94/7/09   Copland, Deirdre Evener, PA-C    Allergies Sertraline  Family History  Problem Relation Age of Onset  . Colon cancer Father 98  . Arthritis Father   . Cancer Father        colon/prostate  . Arthritis Mother   . Hyperlipidemia Mother   . Heart disease Mother   . Stroke Mother   . Hypertension Mother   . Kidney disease Mother   . Diabetes Mother   . Stroke Maternal Grandmother   . Cancer Maternal Grandfather        prostate  . Stroke Paternal Grandfather   . Breast cancer Cousin 23    Social History Social History   Tobacco Use  . Smoking status: Never Smoker  . Smokeless tobacco: Never Used  Substance Use Topics  . Alcohol use: No    Alcohol/week: 0.0 oz  . Drug use: No    Review of Systems  Constitutional: No fever/chills Eyes: No visual changes. ENT: No sore throat. Cardiovascular: Denies chest pain. Respiratory: Denies shortness of breath. Gastrointestinal: No nausea, no vomiting.  No diarrhea.  No constipation. Genitourinary: Negative for  dysuria. Musculoskeletal: Negative for back pain. Skin: Negative for rash. Neurological: Negative for focal weakness or numbness.   ____________________________________________   PHYSICAL EXAM:  VITAL SIGNS: ED Triage Vitals  Enc Vitals Group     BP 05/30/17 1837 (!) 142/92     Pulse Rate 05/30/17 1837 74     Resp 05/30/17 1837 18     Temp 05/30/17 1837 97.6 F (36.4 C)     Temp Source 05/30/17 1837 Oral     SpO2 05/30/17 1837 100 %     Weight 05/30/17 1838 155 lb (70.3 kg)     Height 05/30/17 1838 5\' 9"  (1.753 m)     Head Circumference --      Peak Flow --      Pain Score 05/30/17 1838 4     Pain Loc --        Pain Edu? --      Excl. in [place]? --     Constitutional: Alert and oriented. Well appearing and in no acute distress. Eyes: Conjunctivae are normal.  Head: Atraumatic. Nose: No congestion/rhinnorhea. Mouth/Throat: Mucous membranes are moist.  Neck: No stridor.   Cardiovascular: Normal rate, regular rhythm. Grossly normal heart sounds.   Respiratory: Normal respiratory effort.  No retractions. Lungs CTAB. Gastrointestinal: Soft and nontender. No distention.  Digital rectal exam with brown stool which is heme-negative.  No obvious fissure or hemorrhoids. Musculoskeletal: No lower extremity tenderness nor edema.  No joint effusions. Neurologic:  Normal speech and language. No gross focal neurologic deficits are appreciated. Skin:  Skin is warm, dry and intact. No rash noted. Psychiatric: Mood and affect are normal. Speech and behavior are normal.  ____________________________________________   LABS (all labs ordered are listed, but only abnormal results are displayed)  Labs Reviewed  COMPREHENSIVE METABOLIC PANEL - Abnormal; Notable for the following components:      Result Value   Potassium 3.4 (*)    Glucose, Bld 103 (*)    Alkaline Phosphatase 28 (*)    All other components within normal limits  URINALYSIS, COMPLETE (UACMP) WITH MICROSCOPIC - Abnormal; Notable for the following components:   Color, Urine STRAW (*)    APPearance CLEAR (*)    Hgb urine dipstick SMALL (*)    Bacteria, UA RARE (*)    Squamous Epithelial / LPF 0-5 (*)    All other components within normal limits  LIPASE, BLOOD  CBC  POC URINE PREG, ED   ____________________________________________  EKG   ____________________________________________  RADIOLOGY   ____________________________________________   PROCEDURES  Procedure(s) performed:   Procedures  Critical Care performed:   ____________________________________________   INITIAL IMPRESSION / ASSESSMENT AND PLAN / ED  COURSE  Pertinent labs & imaging results that were available during my care of the patient were reviewed by me and considered in my medical decision making (see chart for details).  Differential diagnosis includes, but is not limited to, ovarian cyst, ovarian torsion, acute appendicitis, diverticulitis, urinary tract infection/pyelonephritis, endometriosis, bowel obstruction, colitis, renal colic, gastroenteritis, hernia, fibroids, endometriosis, pregnancy related pain including ectopic pregnancy, etc. As part of my medical decision making, I reviewed the following data within the electronic MEDICAL RECORD NUMBER Notes from prior office visits  Patient with very benign appearance.  Reassuring lab work without signs of symptomatic anemia.  Heme negative.  I recommended that she follow-up with her doctors in the office as there does not seem to be an emergency medical condition at this time.  She is understanding of this plan  and willing to comply. ____________________________________________   FINAL CLINICAL IMPRESSION(S) / ED DIAGNOSES  Abdominal pain.  Lightheadedness.    NEW MEDICATIONS STARTED DURING THIS VISIT:  New Prescriptions   No medications on file     Note:  This document was prepared using Dragon voice recognition software and may include unintentional dictation errors.     Orbie Pyo, MD 05/30/17 2118

## 2017-05-30 NOTE — ED Triage Notes (Signed)
FIRST NURSE NOTE-here for stomach pain, headache. Ambulatory with steady gait. NAD.

## 2017-05-30 NOTE — ED Triage Notes (Signed)
Pt states abd pain centrally located x 3-4 weeks. States dark stools 2 weeks. Also c/o top HA x 2 weeks. States she gets lightheaded just "whenever" pt is alert, oriented, ambulatory. States stool has green/black in color. Still has appendix and gallbladder.

## 2017-05-31 ENCOUNTER — Telehealth: Payer: Self-pay | Admitting: Family

## 2017-05-31 DIAGNOSIS — R51 Headache: Principal | ICD-10-CM

## 2017-05-31 DIAGNOSIS — G8929 Other chronic pain: Secondary | ICD-10-CM

## 2017-05-31 MED ORDER — ALPRAZOLAM 0.5 MG PO TABS: 0.5000 mg | ORAL_TABLET | Freq: Once | ORAL | 0 refills | Status: AC

## 2017-05-31 NOTE — Telephone Encounter (Signed)
Please advise 

## 2017-05-31 NOTE — Telephone Encounter (Signed)
Copied from [place] [phone]. Topic: Quick Communication - See Telephone Encounter >> May 31, 2017  2:59 PM Neva Seat wrote: Pt has discussed w/ Mable Paris to discuss her headaches and pt is requesting orders for an MRI to be done. Pt will also need something for sedation for the MRI to be done.

## 2017-05-31 NOTE — Telephone Encounter (Signed)
Kristen this should've went to you

## 2017-05-31 NOTE — Telephone Encounter (Signed)
I have ordered MRI Brain and can see she was seen by Gregary Signs recently.  Please confirm NO metal in body. I do not see.   I have ordered xanax to take prior and faxed to [place].

## 2017-06-04 NOTE — Telephone Encounter (Signed)
Patient notified and voiced understanding. Patient confirmed no metal.

## 2017-06-05 ENCOUNTER — Other Ambulatory Visit: Payer: Self-pay | Admitting: Nurse Practitioner

## 2017-06-05 DIAGNOSIS — R109 Unspecified abdominal pain: Secondary | ICD-10-CM

## 2017-06-05 NOTE — Telephone Encounter (Signed)
noted 

## 2017-06-12 ENCOUNTER — Ambulatory Visit: Payer: BC Managed Care – PPO

## 2017-06-13 ENCOUNTER — Ambulatory Visit
Admission: RE | Admit: 2017-06-13 | Discharge: 2017-06-13 | Disposition: A | Discharge status: Home / Self Care | Payer: BC Managed Care – PPO | Source: Ambulatory Visit | Attending: Nurse Practitioner | Admitting: Nurse Practitioner

## 2017-06-13 DIAGNOSIS — R109 Unspecified abdominal pain: Secondary | ICD-10-CM | POA: Insufficient documentation

## 2017-06-13 DIAGNOSIS — N200 Calculus of kidney: Secondary | ICD-10-CM | POA: Insufficient documentation

## 2017-06-13 MED ORDER — IOHEXOL 300 MG/ML  SOLN
100.0000 mL | Freq: Once | INTRAMUSCULAR | Status: AC | PRN
  Administered 2017-06-13: 100 mL via INTRAVENOUS

## 2017-06-15 ENCOUNTER — Ambulatory Visit
Admission: RE | Admit: 2017-06-15 | Discharge: 2017-06-15 | Disposition: A | Discharge status: Home / Self Care | Payer: BC Managed Care – PPO | Source: Ambulatory Visit | Attending: Family | Admitting: Family

## 2017-06-15 DIAGNOSIS — R51 Headache: Secondary | ICD-10-CM | POA: Insufficient documentation

## 2017-06-15 DIAGNOSIS — G8929 Other chronic pain: Secondary | ICD-10-CM

## 2017-06-15 MED ORDER — GADOBENATE DIMEGLUMINE 529 MG/ML IV SOLN
14.0000 mL | Freq: Once | INTRAVENOUS | Status: AC | PRN
  Administered 2017-06-15: 14 mL via INTRAVENOUS

## 2017-06-17 ENCOUNTER — Telehealth: Payer: Self-pay

## 2017-06-17 DIAGNOSIS — J329 Chronic sinusitis, unspecified: Secondary | ICD-10-CM

## 2017-06-17 MED ORDER — AMOXICILLIN 500 MG PO CAPS: 500.0000 mg | ORAL_CAPSULE | Freq: Two times a day (BID) | ORAL | 0 refills | Status: DC

## 2017-06-17 NOTE — Telephone Encounter (Signed)
I have sent in amoxicillin.

## 2017-06-17 NOTE — Addendum Note (Signed)
Addended by: Burnard Hawthorne on: 06/17/2017 04:50 PM   Modules accepted: Orders

## 2017-06-17 NOTE — Telephone Encounter (Signed)
Patient would like and antibiotic called in she thought her symptoms were due to her allergies. Please advise.

## 2017-06-17 NOTE — Telephone Encounter (Signed)
-----   Message from Romana Juniper, RN sent at 06/17/2017  3:18 PM EDT ----- Patient notified of her results- she would like the antibiotic called in- she was thinking her symptoms were more allergy related. She does have her eye surgery scheduled for 1 week from this Wednesday. Patient will cancel the May appointment if her headaches clear up after. Please call antibiotic to her pharmacy.

## 2017-06-18 ENCOUNTER — Ambulatory Visit: Payer: BC Managed Care – PPO

## 2017-06-19 ENCOUNTER — Other Ambulatory Visit: Payer: Self-pay | Admitting: Nurse Practitioner

## 2017-06-19 DIAGNOSIS — R072 Precordial pain: Secondary | ICD-10-CM

## 2017-06-19 DIAGNOSIS — R1084 Generalized abdominal pain: Secondary | ICD-10-CM

## 2017-06-20 ENCOUNTER — Encounter: Payer: Self-pay | Admitting: *Deleted

## 2017-06-20 ENCOUNTER — Other Ambulatory Visit: Payer: Self-pay

## 2017-06-20 DIAGNOSIS — R601 Generalized edema: Secondary | ICD-10-CM | POA: Insufficient documentation

## 2017-06-24 NOTE — Discharge Instructions (Signed)

## 2017-06-25 ENCOUNTER — Ambulatory Visit: Payer: BC Managed Care – PPO

## 2017-06-25 ENCOUNTER — Ambulatory Visit
Admission: RE | Admit: 2017-06-25 | Discharge: 2017-06-25 | Disposition: A | Discharge status: Home / Self Care | Payer: BC Managed Care – PPO | Source: Ambulatory Visit | Attending: Nurse Practitioner | Admitting: Nurse Practitioner

## 2017-06-25 DIAGNOSIS — N200 Calculus of kidney: Secondary | ICD-10-CM | POA: Diagnosis not present

## 2017-06-25 DIAGNOSIS — R1084 Generalized abdominal pain: Secondary | ICD-10-CM | POA: Diagnosis not present

## 2017-06-25 DIAGNOSIS — R072 Precordial pain: Secondary | ICD-10-CM | POA: Diagnosis not present

## 2017-06-26 ENCOUNTER — Encounter
Admission: RE | Disposition: A | Discharge status: Home / Self Care | Payer: Self-pay | Source: Ambulatory Visit | Attending: Ophthalmology

## 2017-06-26 ENCOUNTER — Ambulatory Visit: Payer: BC Managed Care – PPO | Admitting: Anesthesiology

## 2017-06-26 ENCOUNTER — Ambulatory Visit
Admission: RE | Admit: 2017-06-26 | Discharge: 2017-06-26 | Disposition: A | Discharge status: Home / Self Care | Payer: BC Managed Care – PPO | Source: Ambulatory Visit | Attending: Ophthalmology | Admitting: Ophthalmology

## 2017-06-26 DIAGNOSIS — Z79899 Other long term (current) drug therapy: Secondary | ICD-10-CM | POA: Insufficient documentation

## 2017-06-26 DIAGNOSIS — H2512 Age-related nuclear cataract, left eye: Secondary | ICD-10-CM | POA: Diagnosis present

## 2017-06-26 HISTORY — DX: Sciatica, left side: M54.32

## 2017-06-26 HISTORY — PX: CATARACT EXTRACTION W/PHACO: SHX586

## 2017-06-26 HISTORY — DX: Other specified postprocedural states: R11.2

## 2017-06-26 HISTORY — DX: Motion sickness, initial encounter: T75.3XXA

## 2017-06-26 HISTORY — DX: Other specified postprocedural states: Z98.890

## 2017-06-26 HISTORY — DX: Nausea with vomiting, unspecified: R11.2

## 2017-06-26 SURGERY — PHACOEMULSIFICATION, CATARACT, WITH IOL INSERTION
Anesthesia: Monitor Anesthesia Care | Site: Eye | Laterality: Left | Wound class: "Clean "

## 2017-06-26 MED ORDER — ACETAMINOPHEN 325 MG PO TABS: 325.0000 mg | ORAL_TABLET | Freq: Once | ORAL | Status: DC

## 2017-06-26 MED ORDER — FENTANYL CITRATE (PF) 100 MCG/2ML IJ SOLN
INTRAMUSCULAR | Status: DC | PRN
  Administered 2017-06-26 (×2): 50 ug via INTRAVENOUS

## 2017-06-26 MED ORDER — BRIMONIDINE TARTRATE-TIMOLOL 0.2-0.5 % OP SOLN
OPHTHALMIC | Status: DC | PRN
  Administered 2017-06-26: 1 [drp] via OPHTHALMIC

## 2017-06-26 MED ORDER — LIDOCAINE HCL (PF) 2 % IJ SOLN
INTRAOCULAR | Status: DC | PRN
  Administered 2017-06-26: 1 mL

## 2017-06-26 MED ORDER — NA HYALUR & NA CHOND-NA HYALUR 0.4-0.35 ML IO KIT
PACK | INTRAOCULAR | Status: DC | PRN
  Administered 2017-06-26: 1 mL via INTRAOCULAR

## 2017-06-26 MED ORDER — LACTATED RINGERS IV SOLN: INTRAVENOUS | Status: DC

## 2017-06-26 MED ORDER — EPINEPHRINE PF 1 MG/ML IJ SOLN
INTRAOCULAR | Status: DC | PRN
  Administered 2017-06-26: 11:00:00 via OPHTHALMIC

## 2017-06-26 MED ORDER — ACETAMINOPHEN 160 MG/5ML PO SOLN: 325.0000 mg | Freq: Once | ORAL | Status: DC

## 2017-06-26 MED ORDER — CEFUROXIME OPHTHALMIC INJECTION 1 MG/0.1 ML
INJECTION | OPHTHALMIC | Status: DC | PRN
  Administered 2017-06-26: 0.1 mL via INTRACAMERAL

## 2017-06-26 MED ORDER — MOXIFLOXACIN HCL 0.5 % OP SOLN
1.0000 [drp] | OPHTHALMIC | Status: DC | PRN
  Administered 2017-06-26 (×3): 1 [drp] via OPHTHALMIC

## 2017-06-26 MED ORDER — MIDAZOLAM HCL 2 MG/2ML IJ SOLN
INTRAMUSCULAR | Status: DC | PRN
  Administered 2017-06-26 (×2): 1 mg via INTRAVENOUS

## 2017-06-26 MED ORDER — ARMC OPHTHALMIC DILATING DROPS
1.0000 "application " | OPHTHALMIC | Status: DC | PRN
  Administered 2017-06-26 (×3): 1 via OPHTHALMIC

## 2017-06-26 SURGICAL SUPPLY — 27 items
CANNULA ANT/CHMB 27G (MISCELLANEOUS) ×1 IMPLANT
CANNULA ANT/CHMB 27GA (MISCELLANEOUS) ×3 IMPLANT
CARTRIDGE ABBOTT (MISCELLANEOUS) IMPLANT
GLOVE SURG LX 7.5 STRW (GLOVE) ×2
GLOVE SURG LX STRL 7.5 STRW (GLOVE) ×1 IMPLANT
GLOVE SURG TRIUMPH 8.0 PF LTX (GLOVE) ×3 IMPLANT
GOWN STRL REUS W/ TWL LRG LVL3 (GOWN DISPOSABLE) ×2 IMPLANT
GOWN STRL REUS W/TWL LRG LVL3 (GOWN DISPOSABLE) ×4
LENS IOL TECNIS ITEC 18.0 (Intraocular Lens) ×2 IMPLANT
MARKER SKIN DUAL TIP RULER LAB (MISCELLANEOUS) ×3 IMPLANT
NDL FILTER BLUNT 18X1 1/2 (NEEDLE) ×1 IMPLANT
NDL RETROBULBAR .5 NSTRL (NEEDLE) IMPLANT
NEEDLE FILTER BLUNT 18X 1/2SAF (NEEDLE) ×2
NEEDLE FILTER BLUNT 18X1 1/2 (NEEDLE) ×1 IMPLANT
PACK CATARACT BRASINGTON (MISCELLANEOUS) ×3 IMPLANT
PACK EYE AFTER SURG (MISCELLANEOUS) ×3 IMPLANT
PACK OPTHALMIC (MISCELLANEOUS) ×3 IMPLANT
RING MALYGIN 7.0 (MISCELLANEOUS) IMPLANT
SUT ETHILON 10-0 CS-B-6CS-B-6 (SUTURE)
SUT VICRYL  9 0 (SUTURE)
SUT VICRYL 9 0 (SUTURE) IMPLANT
SUTURE EHLN 10-0 CS-B-6CS-B-6 (SUTURE) IMPLANT
SYR 3ML LL SCALE MARK (SYRINGE) ×3 IMPLANT
SYR 5ML LL (SYRINGE) ×3 IMPLANT
SYR TB 1ML LUER SLIP (SYRINGE) ×3 IMPLANT
WATER STERILE IRR 500ML POUR (IV SOLUTION) ×3 IMPLANT
WIPE NON LINTING 3.25X3.25 (MISCELLANEOUS) ×3 IMPLANT

## 2017-06-26 NOTE — Transfer of Care (Signed)
Immediate Anesthesia Transfer of Care Note  Patient: Margaret Mcfarland  Procedure(s) Performed: CATARACT EXTRACTION PHACO AND INTRAOCULAR LENS PLACEMENT (I[facility]) LEFT (Left Eye)  Patient Location: PACU  Anesthesia Type: MAC  Level of Consciousness: awake, alert  and patient cooperative  Airway and Oxygen Therapy: Patient Spontanous Breathing and Patient connected to supplemental oxygen  Post-op Assessment: Post-op Vital signs reviewed, Patient's Cardiovascular Status Stable, Respiratory Function Stable, Patent Airway and No signs of Nausea or vomiting  Post-op Vital Signs: Reviewed and stable  Complications: No apparent anesthesia complications

## 2017-06-26 NOTE — Op Note (Signed)
OPERATIVE NOTE  Margaret Mcfarland [id number] 06/26/2017   PREOPERATIVE DIAGNOSIS:  Nuclear sclerotic cataract left eye. H25.12   POSTOPERATIVE DIAGNOSIS:    Nuclear sclerotic cataract left eye.     PROCEDURE:  Phacoemusification with posterior chamber intraocular lens placement of the left eye   LENS:   Implant Name Type Inv. Item Serial No. Manufacturer Lot No. LRB No. Used  LENS IOL DIOP 18.0 - [id number] Intraocular Lens LENS IOL DIOP 18.0 [id number]  Left 1        ULTRASOUND TIME: 16  % of 0 minutes 57 seconds, CDE 9.4  SURGEON:  Wyonia Hough, MD   ANESTHESIA:  Topical with tetracaine drops and 2% Xylocaine jelly, augmented with 1% preservative-free intracameral lidocaine.    COMPLICATIONS:  None.   DESCRIPTION OF PROCEDURE:  The patient was identified in the holding room and transported to the operating room and placed in the supine position under the operating microscope.  The left eye was identified as the operative eye and it was prepped and draped in the usual sterile ophthalmic fashion.   A 1 millimeter clear-corneal paracentesis was made at the 1:30 position.  0.5 ml of preservative-free 1% lidocaine was injected into the anterior chamber.  The anterior chamber was filled with Viscoat viscoelastic.  A 2.4 millimeter keratome was used to make a near-clear corneal incision at the 10:30 position.  .  A curvilinear capsulorrhexis was made with a cystotome and capsulorrhexis forceps.  Balanced salt solution was used to hydrodissect and hydrodelineate the nucleus.   Phacoemulsification was then used in stop and chop fashion to remove the lens nucleus and epinucleus.  The remaining cortex was then removed using the irrigation and aspiration handpiece. Provisc was then placed into the capsular bag to distend it for lens placement.  A lens was then injected into the capsular bag.  The remaining viscoelastic was aspirated.   Wounds were hydrated with balanced salt solution.   The anterior chamber was inflated to a physiologic pressure with balanced salt solution.  No wound leaks were noted. Cefuroxime 0.1 ml of a 10mg /ml solution was injected into the anterior chamber for a dose of 1 mg of intracameral antibiotic at the completion of the case.   Timolol and Brimonidine drops were applied to the eye.  The patient was taken to the recovery room in stable condition without complications of anesthesia or surgery.  [name] 06/26/2017, 10:50 AM

## 2017-06-26 NOTE — Anesthesia Preprocedure Evaluation (Signed)
Anesthesia Evaluation  Patient identified by MRN, date of birth, ID band Patient awake    Reviewed: Allergy & Precautions, H&P , NPO status , Patient's Chart, lab work & pertinent test results  Airway Mallampati: II  TM Distance: >3 FB Neck ROM: full    Dental no notable dental hx.    Pulmonary    Pulmonary exam normal breath sounds clear to auscultation       Cardiovascular Normal cardiovascular exam Rhythm:regular Rate:Normal     Neuro/Psych  Headaches,    GI/Hepatic   Endo/Other    Renal/GU Renal disease     Musculoskeletal   Abdominal   Peds  Hematology   Anesthesia Other Findings   Reproductive/Obstetrics                             Anesthesia Physical Anesthesia Plan  ASA: II  Anesthesia Plan: MAC   Post-op Pain Management:    Induction:   PONV Risk Score and Plan: 2 and Treatment may vary due to age or medical condition and Midazolam  Airway Management Planned:   Additional Equipment:   Intra-op Plan:   Post-operative Plan:   Informed Consent: I have reviewed the patients History and Physical, chart, labs and discussed the procedure including the risks, benefits and alternatives for the proposed anesthesia with the patient or authorized representative who has indicated his/her understanding and acceptance.     Plan Discussed with: CRNA  Anesthesia Plan Comments:         Anesthesia Quick Evaluation

## 2017-06-26 NOTE — H&P (Signed)
The History and Physical notes are on paper, have been signed, and are to be scanned. The patient remains stable and unchanged from the H&P.   Previous H&P reviewed, patient examined, and there are no changes.  Margaret Mcfarland 06/26/2017 9:38 AM

## 2017-06-26 NOTE — Anesthesia Postprocedure Evaluation (Signed)
Anesthesia Post Note  Patient: Margaret Mcfarland  Procedure(s) Performed: CATARACT EXTRACTION PHACO AND INTRAOCULAR LENS PLACEMENT ([place]) LEFT (Left Eye)  Patient location during evaluation: PACU Anesthesia Type: MAC Level of consciousness: awake and alert and oriented Pain management: satisfactory to patient Vital Signs Assessment: post-procedure vital signs reviewed and stable Respiratory status: spontaneous breathing, nonlabored ventilation and respiratory function stable Cardiovascular status: blood pressure returned to baseline and stable Postop Assessment: Adequate PO intake and No signs of nausea or vomiting Anesthetic complications: no    Raliegh Ip

## 2017-06-26 NOTE — Anesthesia Procedure Notes (Signed)
Procedure Name: MAC Date/Time: 06/26/2017 10:40 AM Performed by: Lind Guest, CRNA Pre-anesthesia Checklist: Patient identified, Emergency Drugs available, Suction available, Patient being monitored and Timeout performed Patient Re-evaluated:Patient Re-evaluated prior to induction Oxygen Delivery Method: Nasal cannula

## 2017-06-27 ENCOUNTER — Encounter: Payer: Self-pay | Admitting: Ophthalmology

## 2017-07-22 ENCOUNTER — Ambulatory Visit: Payer: BC Managed Care – PPO | Admitting: Family

## 2017-08-13 ENCOUNTER — Ambulatory Visit: Payer: Self-pay | Admitting: Urology

## 2017-08-27 ENCOUNTER — Ambulatory Visit: Payer: Self-pay | Admitting: Urology

## 2017-09-02 ENCOUNTER — Ambulatory Visit (INDEPENDENT_AMBULATORY_CARE_PROVIDER_SITE_OTHER): Payer: BC Managed Care – PPO | Admitting: Urology

## 2017-09-02 ENCOUNTER — Encounter: Payer: Self-pay | Admitting: Urology

## 2017-09-02 VITALS — BP 134/76 | HR 80 | Ht 68.0 in | Wt 156.0 lb

## 2017-09-02 DIAGNOSIS — N200 Calculus of kidney: Secondary | ICD-10-CM

## 2017-09-02 LAB — URINALYSIS, COMPLETE
Bilirubin, UA: NEGATIVE
Glucose, UA: NEGATIVE
Ketones, UA: NEGATIVE
Nitrite, UA: NEGATIVE
Protein, UA: NEGATIVE
Specific Gravity, UA: 1.02 (ref 1.005–1.030)
Urobilinogen, Ur: 0.2 mg/dL (ref 0.2–1.0)
pH, UA: 7 (ref 5.0–7.5)

## 2017-09-02 LAB — MICROSCOPIC EXAMINATION

## 2017-09-02 NOTE — Progress Notes (Signed)
09/02/2017 3:18 PM   Margaret Mcfarland October 20, 1970 [id number]  Referring provider: Burnard Hawthorne, FNP [address] [place], [place] [address]  Chief Complaint  Patient presents with  . Nephrolithiasis    New Patient    HPI: 47 year old female who presents today for evaluation of kidney stones.  She was seen and evaluated in GI for tarry stools as well as periumbilical abdominal pain.  She underwent a CT abdomen pelvis with contrast on 06/14/2017 which showed no GI pathology.  She did have an incidental 2 mm left lower pole nonobstructing stone.  No other GU pathology identified.  She denies any gross hematuria.  She has no baseline urinary symptoms.  No previous stone episodes.  She does have a family history of kidney stones, her twin sister has passed multiple stones in the past.   PMH: Past Medical History:  Diagnosis Date  . Anxiety   . Blood in stool   . Cataract   . Chicken pox   . Genital warts   . Kidney disease    per pt medical screening form  . Motion sickness    cars  . PONV (postoperative nausea and vomiting)    nausea only  . Retinal detachment   . Sciatica of left side     Surgical History: Past Surgical History:  Procedure Laterality Date  . CATARACT EXTRACTION W/PHACO Left 06/26/2017   Procedure: CATARACT EXTRACTION PHACO AND INTRAOCULAR LENS PLACEMENT ([place]) LEFT;  Surgeon: Leandrew Koyanagi, MD;  Location: [facility];  Service: Ophthalmology;  Laterality: Left;  . COLONOSCOPY N/A 08/25/2014   Procedure: COLONOSCOPY;  Surgeon: Manya Silvas, MD;  Location[facility] ENDOSCOPY;  Service: Endoscopy;  Laterality: N/A;  . endoscopy    . GUM SURGERY  2008  . laproscopy      Home Medications:  Allergies as of 09/02/2017      Reactions   Sertraline Diarrhea      Medication List        Accurate as of 09/02/17  3:18 PM. Always use your most recent med list.          amoxicillin 500 MG capsule Commonly known as:  AMOXIL Take  1 capsule (500 mg total) by mouth 2 (two) times daily.   CALCIUM 600+D PO Take by mouth daily.   levonorgestrel-ethinyl estradiol 0.15-0.03 MG tablet Commonly known as:  JOLESSA Take 1 tablet daily by mouth.   spironolactone 25 MG tablet Commonly known as:  ALDACTONE Take 25 mg by mouth daily.       Allergies:  Allergies  Allergen Reactions  . Sertraline Diarrhea    Family History: Family History  Problem Relation Age of Onset  . Colon cancer Father 21  . Arthritis Father   . Cancer Father        colon/prostate  . Arthritis Mother   . Hyperlipidemia Mother   . Heart disease Mother   . Stroke Mother   . Hypertension Mother   . Kidney disease Mother   . Diabetes Mother   . Stroke Maternal Grandmother   . Cancer Maternal Grandfather        prostate  . Stroke Paternal Grandfather   . Breast cancer Cousin 83    Social History:  reports that she has never smoked. She has never used smokeless tobacco. She reports that she does not drink alcohol or use drugs.  ROS: UROLOGY Frequent Urination?: No Hard to postpone urination?: No Burning/pain with urination?: No Get up at night  to urinate?: No Leakage of urine?: No Urine stream starts and stops?: No Trouble starting stream?: No Do you have to strain to urinate?: No Blood in urine?: No Urinary tract infection?: No Sexually transmitted disease?: No Injury to kidneys or bladder?: No Painful intercourse?: No Weak stream?: No Currently pregnant?: No Vaginal bleeding?: No Last menstrual period?: n  Gastrointestinal Nausea?: No Vomiting?: No Indigestion/heartburn?: No Diarrhea?: No Constipation?: No  Constitutional Fever: No Night sweats?: No Weight loss?: No Fatigue?: No  Skin Skin rash/lesions?: No Itching?: No  Eyes Blurred vision?: No Double vision?: No  Ears/Nose/Throat Sore throat?: No Sinus problems?: No  Hematologic/Lymphatic Swollen glands?: No Easy bruising?:  No  Cardiovascular Leg swelling?: No Chest pain?: No  Respiratory Cough?: No Shortness of breath?: No  Endocrine Excessive thirst?: No  Musculoskeletal Back pain?: No Joint pain?: No  Neurological Headaches?: No Dizziness?: No  Psychologic Depression?: No Anxiety?: No  Physical Exam: BP 134/76   Pulse 80   Ht 5\' 8"  (1.727 m)   Wt 156 lb (70.8 kg)   BMI 23.72 kg/m   Constitutional:  Alert and oriented, No acute distress.  Accompanied by identical to one sister today. HEENT: [place] AT, moist mucus membranes.  Trachea midline, no masses. Cardiovascular: No clubbing, cyanosis, or edema. Respiratory: Normal respiratory effort, no increased work of breathing. GI: Abdomen is soft, nontender, nondistended, no abdominal masses GU: No CVA tenderness Skin: No rashes, bruises or suspicious lesions. Neurologic: Grossly intact, no focal deficits, moving all 4 extremities. Psychiatric: Normal mood and affect.  Laboratory Data: Lab Results  Component Value Date   WBC 6.5 05/30/2017   HGB 13.7 05/30/2017   HCT 41.7 05/30/2017   MCV 94.0 05/30/2017   PLT 247 05/30/2017    Lab Results  Component Value Date   CREATININE 0.69 05/30/2017     Lab Results  Component Value Date   HGBA1C 5.5 10/17/2016    Urinalysis Component     Latest Ref Rng & Units 05/30/2017  Color, Urine     YELLOW STRAW (A)  Appearance     CLEAR CLEAR (A)  Specific Gravity, Urine     1.005 - 1.030 1.008  pH     5.0 - 8.0 7.0  Glucose     NEGATIVE mg/dL NEGATIVE  Hgb urine dipstick     NEGATIVE SMALL (A)  Bilirubin Urine     NEGATIVE NEGATIVE  Ketones, ur     NEGATIVE mg/dL NEGATIVE  Protein     NEGATIVE mg/dL NEGATIVE  Nitrite     NEGATIVE NEGATIVE  Leukocytes, UA     NEGATIVE NEGATIVE  RBC / HPF     0 - 5 RBC/hpf 0-5  WBC, UA     0 - 5 WBC/hpf 0-5  Bacteria, UA     NONE SEEN RARE (A)  Squamous Epithelial / LPF     NONE SEEN 0-5 (A)  Mucus      PRESENT   Pertinent  Imaging: CLINICAL DATA:  Intermediate mid abdominal pain with black tarry stools for 6 weeks. Nausea.  EXAM: CT ABDOMEN AND PELVIS WITH CONTRAST  TECHNIQUE: Multidetector CT imaging of the abdomen and pelvis was performed using the standard protocol following bolus administration of intravenous contrast.  CONTRAST:  169mL OMNIPAQUE IOHEXOL 300 MG/ML  SOLN  COMPARISON:  None.  FINDINGS: Lower chest: Unremarkable  Hepatobiliary: Contracted gallbladder, otherwise unremarkable.  Pancreas: Unremarkable  Spleen: Unremarkable  Adrenals/Urinary Tract: Small hypodense renal lesions are statistically likely to be cysts but technically  too small to characterize. 2 mm left kidney lower pole nonobstructive renal calculus, image 81/4.  Stomach/Bowel: Unremarkable.  Appendix normal.  Vascular/Lymphatic: Unremarkable  Reproductive: Retroflexed uterus.  Otherwise unremarkable.  Other: No supplemental non-categorized findings.  Musculoskeletal: Unremarkable  IMPRESSION: 1. A cause for the patient's abdominal discomfort and GI bleeding is not identified. 2. 2 mm left kidney lower pole nonobstructive renal calculus.   Electronically Signed   By: Van Clines M.D.   On: 06/14/2017 08:46  CT scan personally reviewed today.  Punctate left lower pole stone which appears to be possibly embedded into tissue, most consistent with Randall's plaque.  Assessment & Plan:    1. Left nephrolithiasis 2 mm left lower pole stone, incidental on CT scan, asymptomatic No role for surgical intervention given the very small size of stone We discussed that the stone may grow with time but likely does not need any serial imaging or follow-up at this point in time unless she becomes symptomatic We discussed general stone prevention techniques including drinking plenty water with goal of producing 2.5 L urine daily, increased citric acid intake, avoidance of high oxalate  containing foods, and decreased salt intake.  Information about dietary recommendations given today.  - Urinalysis, Complete  F/u prn  Hollice Espy, MD  [facility] [address] [place], [place] [address] [phone]

## 2017-09-13 ENCOUNTER — Telehealth: Payer: Self-pay | Admitting: Urology

## 2017-09-13 NOTE — Telephone Encounter (Signed)
Pt called office stating she saw Dr. Erlene Quan July 1, pt stated Dr. Erlene Quan told her that she had a stone "stuck inside the meat of her kidney" pt stated Dr. Erlene Quan advised her to follow up PRN or call office if she started to experience symptoms and or pain, pt calling office today reporting some discomfort and asking what she should do next. Please advise pt at [phone]

## 2017-09-13 NOTE — Telephone Encounter (Signed)
Spoke to patient and she is not hurting bad enough to need anything done at this time.

## 2017-10-01 ENCOUNTER — Encounter: Payer: Self-pay | Admitting: Family Medicine

## 2017-10-01 ENCOUNTER — Ambulatory Visit (INDEPENDENT_AMBULATORY_CARE_PROVIDER_SITE_OTHER): Payer: BC Managed Care – PPO | Admitting: Family Medicine

## 2017-10-01 VITALS — BP 102/64 | HR 62 | Temp 98.1°F | Resp 14 | Wt 158.5 lb

## 2017-10-01 DIAGNOSIS — R300 Dysuria: Secondary | ICD-10-CM | POA: Diagnosis not present

## 2017-10-01 DIAGNOSIS — H6121 Impacted cerumen, right ear: Secondary | ICD-10-CM | POA: Diagnosis not present

## 2017-10-01 LAB — URINALYSIS, MICROSCOPIC ONLY

## 2017-10-01 LAB — POC URINALSYSI DIPSTICK (AUTOMATED)
BILIRUBIN UA: NEGATIVE
GLUCOSE UA: NEGATIVE
KETONES UA: NEGATIVE
Nitrite, UA: POSITIVE
Protein, UA: POSITIVE — AB
SPEC GRAV UA: 1.015 (ref 1.010–1.025)
Urobilinogen, UA: 0.2 E.U./dL
pH, UA: 7.5 (ref 5.0–8.0)

## 2017-10-01 MED ORDER — NITROFURANTOIN MONOHYD MACRO 100 MG PO CAPS: 100.0000 mg | ORAL_CAPSULE | Freq: Two times a day (BID) | ORAL | 0 refills | Status: DC

## 2017-10-01 NOTE — Progress Notes (Signed)
Patient ID: Margaret Mcfarland, female   DOB: 1970-11-13, 47 y.o.   MRN: [id number]    PCP: Burnard Hawthorne, FNP  Subjective:  Margaret Mcfarland is a 47 y.o. year old very pleasant female patient who presents with Urinary Tract symptoms: symptoms including dysuria and mild frequency. -started: approximately 10 days , symptoms are improving slowly but have not resolved. -previous treatments: None  Right ear fullness x 2 days. She reports using DeBrox has provided excellent benefit. She denies pain, discharge, rhinitis,sore throat, sinus pressure/pressure, post nasal drip, or decreased hearing    ROS-denies fever, chills, sweats, N/V, flank pain, or blood in urine  Pertinent Past Medical History- Anxiety  Medications- reviewed  Current Outpatient Medications  Medication Sig Dispense Refill  . Calcium Carbonate-Vitamin D (CALCIUM 600+D PO) Take by mouth daily.    Marland Kitchen levonorgestrel-ethinyl estradiol (JOLESSA) 0.15-0.03 MG tablet Take 1 tablet daily by mouth. 1 Package 4  . spironolactone (ALDACTONE) 25 MG tablet Take 25 mg by mouth daily.     No current facility-administered medications for this visit.     Objective: BP 102/64 (BP Location: Left Arm, Patient Position: Sitting, Cuff Size: Normal)   Pulse 62   Temp 98.1 F (36.7 C) (Oral)   Resp 14   Wt 158 lb 8 oz (71.9 kg)   SpO2 98%   BMI 24.10 kg/m  Gen: NAD, resting comfortably HEENT: oropharynx is clear and moist. Right TM with cerumen impaction; Cerumen removed via irrigation without difficulty and Right TM visualized and WNL; Left TM normal, no sinus tenderness present CV: RRR no murmurs rubs or gallops Lungs: CTAB no crackles, wheeze, rhonchi Abdomen: soft/nontender/nondistended/normal bowel sounds. No rebound or guarding.  No CVA tenderness.   Suprapubic tenderness not present Ext: no edema Skin: warm, dry, no rash Neuro: grossly normal, moves all extremities  Assessment/Plan:  Dysuria UA indicates +Leukocytes, +nitrites, and  blood History and exam today are suggestive of UTI.  Will send for culture. Empiric treatment with nitrofurantoin and advised increased water intake.   Advised patient to complete antibiotic and she should follow up if her symptoms do not improve in 2 to 3 days, worsen, she develops a fever >101, or back pain. Also advised her to force fluids and she can use OTC Azo for symptom relief temporarily if needed. Patient voiced understanding and agreed with plan.   - Urine Microscopic - Urine Culture - POCT Urinalysis Dipstick (Automated) - nitrofurantoin, macrocrystal-monohydrate, (MACROBID) 100 MG capsule; Take 1 capsule (100 mg total) by mouth 2 (two) times daily.  Dispense: 10 capsule; Refill: 0  2. Impacted cerumen of right ear Cerumen successfully removed via irrigation without difficulty. Right TM visualized and is WNL. Advised use of Debrox in the future if needed.    Finally, we reviewed reasons to return to care including if symptoms worsen or persist or new concerns arise- once again particularly fever, N/V, or flank pain.    Laurita Quint, FNP

## 2017-10-01 NOTE — Patient Instructions (Signed)
Please complete antibiotic as directed. AZO can be used for symptom relief during treatment. Please drink plenty of water, enough to keep your urine pale yellow or clear.  If you develop symptoms of fever >101, pain in your back, or do not improve with treatment that has been provided in 3 to 4 days, please follow up for further evaluation and treatment.   Urinary Tract Infection, Adult A urinary tract infection (UTI) is an infection of any part of the urinary tract. The urinary tract includes the:  Kidneys.  Ureters.  Bladder.  Urethra.  These organs make, store, and get rid of pee (urine) in the body. Follow these instructions at home:  Take over-the-counter and prescription medicines only as told by your doctor.  If you were prescribed an antibiotic medicine, take it as told by your doctor. Do not stop taking the antibiotic even if you start to feel better.  Avoid the following drinks: ? Alcohol. ? Caffeine. ? Tea. ? Carbonated drinks.  Drink enough fluid to keep your pee clear or pale yellow.  Keep all follow-up visits as told by your doctor. This is important.  Make sure to: ? Empty your bladder often and completely. Do not to hold pee for long periods of time. ? Empty your bladder before and after sex. ? Wipe from front to back after a bowel movement if you are female. Use each tissue one time when you wipe. Contact a doctor if:  You have back pain.  You have a fever.  You feel sick to your stomach (nauseous).  You throw up (vomit).  Your symptoms do not get better after 3 days.  Your symptoms go away and then come back. Get help right away if:  You have very bad back pain.  You have very bad lower belly (abdominal) pain.  You are throwing up and cannot keep down any medicines or water. This information is not intended to replace advice given to you by your health care provider. Make sure you discuss any questions you have with your health care  provider. Document Released: 08/08/2007 Document Revised: 07/28/2015 Document Reviewed: 01/10/2015 Elsevier Interactive Patient Education  [employer].

## 2017-10-01 NOTE — Progress Notes (Signed)
Right ear lavage performed,  ear irrigated and wax successfully removed. Patient tolerated procedure well.

## 2017-10-03 LAB — URINE CULTURE
MICRO NUMBER:: 90899468
SPECIMEN QUALITY: ADEQUATE

## 2017-10-04 ENCOUNTER — Telehealth: Payer: Self-pay

## 2017-10-04 NOTE — Telephone Encounter (Signed)
Copied from [place] [id number]. Topic: General - Other >> Oct 04, 2017  3:05 PM Margaret Mcfarland wrote: Reason for CRM: pt calling stating that Arnett was going to put lab orders in for blood work

## 2017-10-08 NOTE — Telephone Encounter (Signed)
Call pt- I am not sure what labs she is speaking of?  Last labs we did were 10/2016  Which looked great.  Please advise her to make  A cpe appt

## 2017-10-09 ENCOUNTER — Telehealth: Payer: Self-pay | Admitting: Family

## 2017-10-09 NOTE — Telephone Encounter (Signed)
kristen spoke with patient.  See phone note Duplicate note.

## 2017-10-09 NOTE — Telephone Encounter (Signed)
Patient advised no labs needed due to previous labs normal. She will  Go to gyn for pap smear.  Will hold off on scheduling CPE for now.

## 2017-10-09 NOTE — Telephone Encounter (Signed)
Copied from [place] [id number]. Topic: General - Other >> Oct 09, 2017 10:01 AM Yvette Rack wrote: Reason for CRM: Pt states she was told labs would be ordered but she has not heard from anyone. Cb# [phone]

## 2017-11-11 ENCOUNTER — Telehealth: Payer: Self-pay | Admitting: Family

## 2017-11-11 NOTE — Telephone Encounter (Signed)
Copied from [place] [id number]. Topic: Quick Communication - Rx Refill/Question >> Nov 11, 2017  3:02 PM Selinda Flavin B, [place] wrote: **Patient would like to know if [name] could change the medication to take as needed?**  Medication: ALPRAZolam (XANAX) 0.5 MG tablet   Has the patient contacted their pharmacy? Yes.   (Agent: If no, request that the patient contact the pharmacy for the refill.) (Agent: If yes, when and what did the pharmacy advise?)  Preferred Pharmacy (with phone number or street name): [facility] [place], [place]: Please be advised that RX refills may take up to 3 business days. We ask that you follow-up with your pharmacy.

## 2017-11-12 NOTE — Telephone Encounter (Signed)
This medication is not on med list, patient saw Gregary Signs in July of this year and does not have a scheduled appt with [name]. Does patient need appointment.

## 2017-11-12 NOTE — Telephone Encounter (Signed)
Left voicemail to  Script looks like it was written 05/31/17 to be taken 30 minutes prior to procedure before MRI

## 2017-11-12 NOTE — Telephone Encounter (Signed)
Left VM to CB to clarify refill request. Not on current med profile.

## 2017-11-12 NOTE — Telephone Encounter (Signed)
Spoke with patient she is requesting to go back on lexapro she wants to as needed instead of once a day.

## 2017-11-13 NOTE — Telephone Encounter (Signed)
Call pt  She needs OV to discuss anxiety . pls schedule  lexapro is NOT a prn medication.

## 2017-11-14 ENCOUNTER — Telehealth: Payer: Self-pay

## 2017-11-14 NOTE — Telephone Encounter (Signed)
Left voice mail for patient to call back ok for PEC to speak to patient , see below message    

## 2017-11-14 NOTE — Telephone Encounter (Signed)
Copied from [place] [id number]. Topic: Quick Communication - See Telephone Encounter >> Nov 14, 2017  3:05 PM Johna Sheriff, CMA wrote: CRM for notification. See Telephone encounter for: 11/14/17. >> Nov 14, 2017  3:11 PM Bea Graff, NT wrote: Pt calling to schedule her appt. She states that she can only come in tomorrow around 2:30 or next Wednesday after lunch. Can pt be worked in? Please call to schedule

## 2017-11-15 ENCOUNTER — Encounter: Payer: Self-pay | Admitting: Family

## 2017-11-15 ENCOUNTER — Ambulatory Visit (INDEPENDENT_AMBULATORY_CARE_PROVIDER_SITE_OTHER): Payer: BC Managed Care – PPO | Admitting: Family

## 2017-11-15 VITALS — BP 110/74 | HR 66 | Temp 97.6°F | Resp 16 | Ht 68.0 in | Wt 156.2 lb

## 2017-11-15 DIAGNOSIS — F419 Anxiety disorder, unspecified: Secondary | ICD-10-CM | POA: Diagnosis not present

## 2017-11-15 MED ORDER — ESCITALOPRAM OXALATE 10 MG PO TABS: 10.0000 mg | ORAL_TABLET | Freq: Every day | ORAL | 1 refills | Status: DC

## 2017-11-15 NOTE — Telephone Encounter (Signed)
Pt is going to work this am and gets off at 2 pm. Pt states ok to leave VM is ok to come in today.

## 2017-11-15 NOTE — Telephone Encounter (Signed)
Pt has confirmed appt and will be there

## 2017-11-15 NOTE — Assessment & Plan Note (Signed)
Improved with the resumption of Lexapro.  Patient politely declines referral to counseling at this time.  Discussed exercise and it's  important role to help manage anxiety and advised her to increase from 15 minutes/day to closer to 30 minutes most days of the week.  Patient very willing to do this.  Will discuss anxiety at follow-up.

## 2017-11-15 NOTE — Patient Instructions (Addendum)
I am so glad to see you are back on Lexapro ; this is a great medication for anxiety. This medication will gradually increase her serotonin which helps manage anxiety.  This Is also why you cannot take this medication as needed.  You must stay on it every day.  If you decide to come off at any point, please get appointment with me so we can titrate you off.   Please also increase exercise to closer to 30 minutes on most days of the week.  This is a great tool for controlling anxiety.   Generalized Anxiety Disorder, Adult Generalized anxiety disorder (GAD) is a mental health disorder. People with this condition constantly worry about everyday events. Unlike normal anxiety, worry related to GAD is not triggered by a specific event. These worries also do not fade or get better with time. GAD interferes with life functions, including relationships, work, and school. GAD can vary from mild to severe. People with severe GAD can have intense waves of anxiety with physical symptoms (panic attacks). What are the causes? The exact cause of GAD is not known. What increases the risk? This condition is more likely to develop in:  Women.  People who have a family history of anxiety disorders.  People who are very shy.  People who experience very stressful life events, such as the death of a loved one.  People who have a very stressful family environment.  What are the signs or symptoms? People with GAD often worry excessively about many things in their lives, such as their health and family. They may also be overly concerned about:  Doing well at work.  Being on time.  Natural disasters.  Friendships.  Physical symptoms of GAD include:  Fatigue.  Muscle tension or having muscle twitches.  Trembling or feeling shaky.  Being easily startled.  Feeling like your heart is pounding or racing.  Feeling out of breath or like you cannot take a deep breath.  Having trouble falling asleep or  staying asleep.  Sweating.  Nausea, diarrhea, or irritable bowel syndrome (IBS).  Headaches.  Trouble concentrating or remembering facts.  Restlessness.  Irritability.  How is this diagnosed? Your health care provider can diagnose GAD based on your symptoms and medical history. You will also have a physical exam. The health care provider will ask specific questions about your symptoms, including how severe they are, when they started, and if they come and go. Your health care provider may ask you about your use of alcohol or drugs, including prescription medicines. Your health care provider may refer you to a mental health specialist for further evaluation. Your health care provider will do a thorough examination and may perform additional tests to rule out other possible causes of your symptoms. To be diagnosed with GAD, a person must have anxiety that:  Is out of his or her control.  Affects several different aspects of his or her life, such as work and relationships.  Causes distress that makes him or her unable to take part in normal activities.  Includes at least three physical symptoms of GAD, such as restlessness, fatigue, trouble concentrating, irritability, muscle tension, or sleep problems.  Before your health care provider can confirm a diagnosis of GAD, these symptoms must be present more days than they are not, and they must last for six months or longer. How is this treated? The following therapies are usually used to treat GAD:  Medicine. Antidepressant medicine is usually prescribed for long-term daily control.  Antianxiety medicines may be added in severe cases, especially when panic attacks occur.  Talk therapy (psychotherapy). Certain types of talk therapy can be helpful in treating GAD by providing support, education, and guidance. Options include: ? Cognitive behavioral therapy (CBT). People learn coping skills and techniques to ease their anxiety. They learn to  identify unrealistic or negative thoughts and behaviors and to replace them with positive ones. ? Acceptance and commitment therapy (ACT). This treatment teaches people how to be mindful as a way to cope with unwanted thoughts and feelings. ? Biofeedback. This process trains you to manage your body's response (physiological response) through breathing techniques and relaxation methods. You will work with a therapist while machines are used to monitor your physical symptoms.  Stress management techniques. These include yoga, meditation, and exercise.  A mental health specialist can help determine which treatment is best for you. Some people see improvement with one type of therapy. However, other people require a combination of therapies. Follow these instructions at home:  Take over-the-counter and prescription medicines only as told by your health care provider.  Try to maintain a normal routine.  Try to anticipate stressful situations and allow extra time to manage them.  Practice any stress management or self-calming techniques as taught by your health care provider.  Do not punish yourself for setbacks or for not making progress.  Try to recognize your accomplishments, even if they are small.  Keep all follow-up visits as told by your health care provider. This is important. Contact a health care provider if:  Your symptoms do not get better.  Your symptoms get worse.  You have signs of depression, such as: ? A persistently sad, cranky, or irritable mood. ? Loss of enjoyment in activities that used to bring you joy. ? Change in weight or eating. ? Changes in sleeping habits. ? Avoiding friends or family members. ? Loss of energy for normal tasks. ? Feelings of guilt or worthlessness. Get help right away if:  You have serious thoughts about hurting yourself or others. If you ever feel like you may hurt yourself or others, or have thoughts about taking your own life, get help  right away. You can go to your nearest emergency department or call:  Your local emergency services (911 in the [place]  A suicide crisis helpline, such as the [facility] at 807-548-8137. This is open 24 hours a day.  Summary  Generalized anxiety disorder (GAD) is a mental health disorder that involves worry that is not triggered by a specific event.  People with GAD often worry excessively about many things in their lives, such as their health and family.  GAD may cause physical symptoms such as restlessness, trouble concentrating, sleep problems, frequent sweating, nausea, diarrhea, headaches, and trembling or muscle twitching.  A mental health specialist can help determine which treatment is best for you. Some people see improvement with one type of therapy. However, other people require a combination of therapies. This information is not intended to replace advice given to you by your health care provider. Make sure you discuss any questions you have with your health care provider. Document Released: 06/16/2012 Document Revised: 01/10/2016 Document Reviewed: 01/10/2016 Elsevier Interactive Patient Education  [employer].

## 2017-11-15 NOTE — Progress Notes (Signed)
Subjective:    Patient ID: Margaret Mcfarland, female    DOB: [birth year], 47 y.o.   MRN: [id number]  CC: Margaret Mcfarland is a 47 y.o. female who presents today for follow up.   HPI: Accompanied by her sister today.   GAD- didn't want to be medication for anxiety , so took herself off of lexapro approximately 3 months ago. She then restarted the medication 2 months ago as felt anxiety had worsened. Feels well today and anxiety has improved.  Feels that 10 mg of Lexapro is a good dose. No depression. No si/hi.  No panic attacks. Sleeping well. Excercises 15 minutes per day with exercise bike.      HISTORY:  Past Medical History:  Diagnosis Date  . Anxiety   . Blood in stool   . Cataract   . Chicken pox   . Genital warts   . Kidney disease    per pt medical screening form  . Motion sickness    cars  . PONV (postoperative nausea and vomiting)    nausea only  . Retinal detachment   . Sciatica of left side    Past Surgical History:  Procedure Laterality Date  . CATARACT EXTRACTION W/PHACO Left 06/26/2017   Procedure: CATARACT EXTRACTION PHACO AND INTRAOCULAR LENS PLACEMENT ([place]) LEFT;  Surgeon: Leandrew Koyanagi, MD;  Location: [facility];  Service: Ophthalmology;  Laterality: Left;  . COLONOSCOPY N/A 08/25/2014   Procedure: COLONOSCOPY;  Surgeon: Manya Silvas, MD;  Location: [facility] ENDOSCOPY;  Service: Endoscopy;  Laterality: N/A;  . endoscopy    . GUM SURGERY  2008  . laproscopy     Family History  Problem Relation Age of Onset  . Colon cancer Father 58  . Arthritis Father   . Cancer Father        colon/prostate  . Arthritis Mother   . Hyperlipidemia Mother   . Heart disease Mother   . Stroke Mother   . Hypertension Mother   . Kidney disease Mother   . Diabetes Mother   . Stroke Maternal Grandmother   . Cancer Maternal Grandfather        prostate  . Stroke Paternal Grandfather   . Breast cancer Cousin 35    Allergies: Sertraline Current Outpatient Medications  on File Prior to Visit  Medication Sig Dispense Refill  . Calcium Carbonate-Vitamin D (CALCIUM 600+D PO) Take by mouth daily.    Margaret Mcfarland levonorgestrel-ethinyl estradiol (JOLESSA) 0.15-0.03 MG tablet Take 1 tablet daily by mouth. 1 Package 4  . spironolactone (ALDACTONE) 25 MG tablet Take 25 mg by mouth daily.     No current facility-administered medications on file prior to visit.     Social History   Tobacco Use  . Smoking status: Never Smoker  . Smokeless tobacco: Never Used  Substance Use Topics  . Alcohol use: No    Alcohol/week: 0.0 standard drinks  . Drug use: No    Review of Systems  Constitutional: Negative for chills and fever.  Respiratory: Negative for cough.   Cardiovascular: Negative for chest pain and palpitations.  Gastrointestinal: Negative for nausea and vomiting.  Psychiatric/Behavioral: Negative for sleep disturbance and suicidal ideas. The patient is nervous/anxious.       Objective:    BP 110/74 (BP Location: Left Arm, Patient Position: Sitting, Cuff Size: Normal)   Pulse 66   Temp 97.6 F (36.4 C) (Oral)   Resp 16   Ht 5\' 8"  (1.727 m)   Wt 156 lb 4 oz (70.9  kg)   SpO2 97%   BMI 23.76 kg/m  BP Readings from Last 3 Encounters:  11/15/17 110/74  10/01/17 102/64  09/02/17 134/76   Wt Readings from Last 3 Encounters:  11/15/17 156 lb 4 oz (70.9 kg)  10/01/17 158 lb 8 oz (71.9 kg)  09/02/17 156 lb (70.8 kg)    Physical Exam  Constitutional: She appears well-developed and well-nourished.  Eyes: Conjunctivae are normal.  Cardiovascular: Normal rate, regular rhythm, normal heart sounds and normal pulses.  Pulmonary/Chest: Effort normal and breath sounds normal. She has no wheezes. She has no rhonchi. She has no rales.  Neurological: She is alert.  Skin: Skin is warm and dry.  Psychiatric: She has a normal mood and affect. Her speech is normal and behavior is normal. Thought content normal.  Well appearing, smiling.  Vitals reviewed.        Assessment & Plan:   Problem List Items Addressed This Visit      Other   Anxiety - Primary    Improved with the resumption of Lexapro.  Patient politely declines referral to counseling at this time.  Discussed exercise and it's  important role to help manage anxiety and advised her to increase from 15 minutes/day to closer to 30 minutes most days of the week.  Patient very willing to do this.  Will discuss anxiety at follow-up.      Relevant Medications   escitalopram (LEXAPRO) 10 MG tablet       I have discontinued Margaret Mcfarland's nitrofurantoin (macrocrystal-monohydrate). I am also having her start on escitalopram. Additionally, I am having her maintain her levonorgestrel-ethinyl estradiol, Calcium Carbonate-Vitamin D (CALCIUM 600+D PO), and spironolactone.   Meds ordered this encounter  Medications  . escitalopram (LEXAPRO) 10 MG tablet    Sig: Take 1 tablet (10 mg total) by mouth daily.    Dispense:  90 tablet    Refill:  1    Order Specific Question:   Supervising Provider    Answer:   Margaret Mcfarland [[id number]]    Return precautions given.   Risks, benefits, and alternatives of the medications and treatment plan prescribed today were discussed, and patient expressed understanding.   Education regarding symptom management and diagnosis given to patient on AVS.  Continue to follow with Burnard Hawthorne, FNP for routine health maintenance.   Margaret Mcfarland and I agreed with plan.   Margaret Paris, FNP

## 2017-11-15 NOTE — Telephone Encounter (Signed)
Left voice mail for patient to call back ok for PEC to speak to patient , see below message.  Left message for patient appointment opening today  With Margaret at 4:00 pm a/r 3:45. Please confirm if she is able to make appointment today.

## 2017-11-26 ENCOUNTER — Telehealth: Payer: Self-pay | Admitting: Family

## 2017-12-03 ENCOUNTER — Other Ambulatory Visit: Payer: Self-pay | Admitting: Obstetrics and Gynecology

## 2017-12-03 DIAGNOSIS — Z1231 Encounter for screening mammogram for malignant neoplasm of breast: Secondary | ICD-10-CM

## 2018-01-08 ENCOUNTER — Encounter: Payer: Self-pay | Admitting: Family

## 2018-01-08 ENCOUNTER — Ambulatory Visit (INDEPENDENT_AMBULATORY_CARE_PROVIDER_SITE_OTHER): Payer: BC Managed Care – PPO | Admitting: Family

## 2018-01-08 VITALS — BP 120/80 | HR 64 | Temp 97.9°F | Resp 16 | Ht 68.0 in | Wt 156.1 lb

## 2018-01-08 DIAGNOSIS — R5383 Other fatigue: Secondary | ICD-10-CM | POA: Diagnosis not present

## 2018-01-08 DIAGNOSIS — R079 Chest pain, unspecified: Secondary | ICD-10-CM

## 2018-01-08 NOTE — Progress Notes (Signed)
Subjective:    Patient ID: Margaret Mcfarland, female    DOB: Nov 28, 1970, 47 y.o.   MRN: [id number]  CC: Margaret Mcfarland is a 47 y.o. female who presents today for an acute visit.    HPI: CC fatigue for 1-2 years, waxes and wanes.   Once up and moving to work. Feels sluggish when getting up in the morning. Goes to bed 1030 and wakes up 6. Mostly wakes up feeling restored, however days when feels 'wore out.' No snoring. Does have headaches.   At times, noticed left sided chest pain. Occurs when sitting.  Scheduled for f/u cardiology 02/2018; states that stress test was normal 07/2017.   No cp with exercise. Pain can left a couple of hours, resolves on its own. Wanders if anxiety. Feels it more when  hurrying to get something done at work such as 'pushing to get kids fed.' Last episode of cp was yesterday, states occurred after sat down after cleaning.   Denies numbness or tingling radiating to left arm or jaw, palpitations, dizziness, frequent headaches, changes in vision, or shortness of breath.    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   Exercise- walking 15 minutes / 15 bike 3-4 x week.   GAD- resumed lexapro at last OV. Feels better on lexapro.  Anxiety improved. No depression.  No si/hi    Echo 06/2017 EF 50%, Mild regurgitation.   UTD pap.  Mammogram scheduled.   HISTORY:  Past Medical History:  Diagnosis Date  . Anxiety   . Blood in stool   . Cataract   . Chicken pox   . Genital warts   . Kidney disease    per pt medical screening form  . Motion sickness    cars  . PONV (postoperative nausea and vomiting)    nausea only  . Retinal detachment   . Sciatica of left side    Past Surgical History:  Procedure Laterality Date  . CATARACT EXTRACTION W/PHACO Left 06/26/2017   Procedure: CATARACT EXTRACTION PHACO AND INTRAOCULAR LENS PLACEMENT ([place]) LEFT;  Surgeon: Leandrew Koyanagi, MD;  Location: [facility];  Service: Ophthalmology;  Laterality: Left;  .  COLONOSCOPY N/A 08/25/2014   Procedure: COLONOSCOPY;  Surgeon: Manya Silvas, MD;  Location: [facility] ENDOSCOPY;  Service: Endoscopy;  Laterality: N/A;  . endoscopy    . GUM SURGERY  2008  . laproscopy     Family History  Problem Relation Age of Onset  . Colon cancer Father 48  . Arthritis Father   . Cancer Father        colon/prostate  . Arthritis Mother   . Hyperlipidemia Mother   . Heart disease Mother   . Stroke Mother   . Hypertension Mother   . Kidney disease Mother   . Diabetes Mother   . Stroke Maternal Grandmother   . Cancer Maternal Grandfather        prostate  . Stroke Paternal Grandfather   . Breast cancer Cousin 35    Allergies: Sertraline Current Outpatient Medications on File Prior to Visit  Medication Sig Dispense Refill  . Calcium Carbonate-Vitamin D (CALCIUM 600+D PO) Take by mouth daily.    Marland Kitchen escitalopram (LEXAPRO) 10 MG tablet Take 1 tablet (10 mg total) by mouth daily. 90 tablet 1  . levonorgestrel-ethinyl estradiol (JOLESSA) 0.15-0.03 MG tablet Take 1 tablet daily by mouth. 1 Package 4  . spironolactone (ALDACTONE) 25 MG tablet Take 25 mg by mouth daily.     No current facility-administered  medications on file prior to visit.     Social History   Tobacco Use  . Smoking status: Never Smoker  . Smokeless tobacco: Never Used  Substance Use Topics  . Alcohol use: No    Alcohol/week: 0.0 standard drinks  . Drug use: No    Review of Systems  Constitutional: Positive for fatigue. Negative for chills and fever.  Respiratory: Negative for cough, shortness of breath and wheezing.   Cardiovascular: Positive for chest pain. Negative for palpitations.  Gastrointestinal: Negative for nausea and vomiting.  Psychiatric/Behavioral: Negative for sleep disturbance and suicidal ideas. The patient is nervous/anxious.       Objective:    BP 120/80 (BP Location: Left Arm, Patient Position: Sitting, Cuff Size: Normal)   Pulse 64   Temp 97.9 F (36.6 C) (Oral)    Resp 16   Ht 5\' 8"  (1.727 m)   Wt 156 lb 2 oz (70.8 kg)   SpO2 98%   BMI 23.74 kg/m   Wt Readings from Last 3 Encounters:  01/08/18 156 lb 2 oz (70.8 kg)  11/15/17 156 lb 4 oz (70.9 kg)  10/01/17 158 lb 8 oz (71.9 kg)    Physical Exam  Constitutional: She appears well-developed and well-nourished.  Eyes: Conjunctivae are normal.  Cardiovascular: Normal rate, regular rhythm, normal heart sounds and normal pulses.  Pulmonary/Chest: Effort normal and breath sounds normal. She has no wheezes. She has no rhonchi. She has no rales. She exhibits no tenderness.  Neurological: She is alert.  Skin: Skin is warm and dry.  Psychiatric: She has a normal mood and affect. Her speech is normal and behavior is normal. Thought content normal.  Vitals reviewed.      Assessment & Plan:   Problem List Items Addressed This Visit      Other   Chest pain    EKG does not show any acute ischemia.  She is bradycardic. Of note,HR on exam 64.   Discussed with the Dr Nehemiah Massed on call;  he felt with low risk factors, chest pain unlikely to be ischemic however he was very willing to see her early in his office.  He states that the nurse will give her a call.  Advised patient to be proactive and she going to call his office to schedule a sooner appointment than her prior appointment scheduled in December.  In the interim , given patient information on myocardial infarction and advised to be closely vigilant.  advised if she had any further chest pain, go immediately to emergency room, she verbalized understanding of this. EKG reviewed with supervising, Dr Deborra Medina, and she and I jointly agreed on management plan.        Relevant Orders   EKG 12-Lead (Completed)   Other fatigue - Primary    Etiology of fatigue not specific at this time.   Pending extensive  lab evaluation.  Patient has cardiology appointment as well.  If lab and cardiology appointment does not reveal cause, we will also likely pursue a  sleep study.  Close follow-up.      Relevant Orders   CBC with Differential/Platelet   Comprehensive metabolic panel   Hemoglobin A1c   TSH   VITAMIN D 25 Hydroxy (Vit-D Deficiency, Fractures)   EKG 12-Lead (Completed)        I am having Godfrey Pick maintain her levonorgestrel-ethinyl estradiol, Calcium Carbonate-Vitamin D (CALCIUM 600+D PO), spironolactone, and escitalopram.   No orders of the defined types were placed in this encounter.  Return precautions given.   Risks, benefits, and alternatives of the medications and treatment plan prescribed today were discussed, and patient expressed understanding.   Education regarding symptom management and diagnosis given to patient on AVS.  Continue to follow with Burnard Hawthorne, FNP for routine health maintenance.   Godfrey Pick and I agreed with plan.   Mable Paris, FNP

## 2018-01-08 NOTE — Assessment & Plan Note (Addendum)
Etiology of fatigue not specific at this time.   Pending extensive  lab evaluation.  Patient has cardiology appointment as well.  If lab and cardiology appointment does not reveal cause, we will also likely pursue a sleep study.  Close follow-up.

## 2018-01-08 NOTE — Assessment & Plan Note (Addendum)
EKG does not show any acute ischemia.  She is bradycardic. Of note,HR on exam 64.   Discussed with the Dr Nehemiah Massed on call;  he felt with low risk factors, chest pain unlikely to be ischemic however he was very willing to see her early in his office.  He states that the nurse will give her a call.  Advised patient to be proactive and she going to call his office to schedule a sooner appointment than her prior appointment scheduled in December.  In the interim , given patient information on myocardial infarction and advised to be closely vigilant.  advised if she had any further chest pain, go immediately to emergency room, she verbalized understanding of this. EKG reviewed with supervising, Dr Deborra Medina, and she and I jointly agreed on management plan.

## 2018-01-08 NOTE — Patient Instructions (Addendum)
Labs today  If labs are unrevealing for fatigue, please call to ask for sleep study referral.   Lexapro increases serotonin which helps with anxiety, depression  Please ensure follow up with Dr Nehemiah Massed- exep as discussed, please advised to call from his office suspect with him today.  YOu may go ahead and call his office today to schedule sooner follow-up . Good to be proactive.   Any episodes of chest pain between now and seeing cardiology, please go to your nearest emergency room as discussed.   Heart Attack A heart attack (myocardial infarction, MI) causes damage to the heart that cannot be fixed. A heart attack often happens when a blood clot or other blockage cuts blood flow to the heart. When this happens, certain areas of the heart begin to die. This causes the pain you feel during a heart attack. Follow these instructions at home:  Take medicine as told by your doctor. You may need medicine to: ? Keep your blood from clotting too easily. ? Control your blood pressure. ? Lower your cholesterol. ? Control abnormal heart rhythms.  Change certain behaviors as told by your doctor. This may include: ? Quitting smoking. ? Being active. ? Eating a heart-healthy diet. Ask your doctor for help with this diet. ? Keeping a healthy weight. ? Keeping your diabetes under control. ? Lessening stress. ? Limiting how much alcohol you drink. Do not take these medicines unless your doctor says that you can:  Nonsteroidal anti-inflammatory drugs (NSAIDs). These include: ? Ibuprofen. ? Naproxen. ? Celecoxib.  Vitamin supplements that have vitamin A, vitamin E, or both.  Hormone therapy that contains estrogen with or without progestin.  Get help right away if:  You have sudden chest discomfort.  You have sudden discomfort in your: ? Arms. ? Back. ? Neck. ? Jaw.  You have shortness of breath at any time.  You have sudden sweating or clammy skin.  You feel sick to your stomach  (nauseous) or throw up (vomit).  You suddenly get light-headed or dizzy.  You feel your heart beating fast or skipping beats. These symptoms may be an emergency. Do not wait to see if the symptoms will go away. Get medical help right away. Call your local emergency services (911 in the [place] Do not drive yourself to the hospital. This information is not intended to replace advice given to you by your health care provider. Make sure you discuss any questions you have with your health care provider. Document Released: 08/21/2011 Document Revised: 07/28/2015 Document Reviewed: 04/24/2013 Elsevier Interactive Patient Education  2017 [employer].

## 2018-01-09 LAB — COMPREHENSIVE METABOLIC PANEL
ALK PHOS: 34 U/L — AB (ref 39–117)
ALT: 10 U/L (ref 0–35)
AST: 15 U/L (ref 0–37)
Albumin: 4.3 g/dL (ref 3.5–5.2)
BILIRUBIN TOTAL: 0.5 mg/dL (ref 0.2–1.2)
BUN: 14 mg/dL (ref 6–23)
CO2: 28 meq/L (ref 19–32)
CREATININE: 0.75 mg/dL (ref 0.40–1.20)
Calcium: 9.4 mg/dL (ref 8.4–10.5)
Chloride: 103 mEq/L (ref 96–112)
GFR: 87.13 mL/min (ref >60.00)
GLUCOSE: 83 mg/dL (ref 70–99)
Potassium: 4.3 mEq/L (ref 3.5–5.1)
Sodium: 136 mEq/L (ref 135–145)
TOTAL PROTEIN: 7.1 g/dL (ref 6.0–8.3)

## 2018-01-09 LAB — CBC WITH DIFFERENTIAL/PLATELET
BASOS ABS: 0.1 10*3/uL (ref 0.0–0.1)
BASOS PCT: 1.1 % (ref 0.0–3.0)
Eosinophils Absolute: 0.1 10*3/uL (ref 0.0–0.7)
Eosinophils Relative: 1.2 % (ref 0.0–5.0)
HEMATOCRIT: 42.1 % (ref 36.0–46.0)
Hemoglobin: 14 g/dL (ref 12.0–15.0)
LYMPHS PCT: 29.7 % (ref 12.0–46.0)
Lymphs Abs: 1.9 10*3/uL (ref 0.7–4.0)
MCHC: 33.3 g/dL (ref 30.0–36.0)
MCV: 91.3 fl (ref 78.0–100.0)
MONOS PCT: 9.2 % (ref 3.0–12.0)
Monocytes Absolute: 0.6 10*3/uL (ref 0.1–1.0)
NEUTROS ABS: 3.7 10*3/uL (ref 1.4–7.7)
NEUTROS PCT: 58.8 % (ref 43.0–77.0)
PLATELETS: 252 10*3/uL (ref 150.0–400.0)
RBC: 4.61 Mil/uL (ref 3.87–5.11)
RDW: 13.2 % (ref 11.5–15.5)
WBC: 6.3 10*3/uL (ref 4.0–10.5)

## 2018-01-09 LAB — TSH: TSH: 1.69 u[IU]/mL (ref 0.35–4.50)

## 2018-01-09 LAB — HEMOGLOBIN A1C: Hgb A1c MFr Bld: 5.5 % (ref 4.6–6.5)

## 2018-01-09 LAB — VITAMIN D 25 HYDROXY (VIT D DEFICIENCY, FRACTURES): VITD: 46.47 ng/mL (ref 30.00–100.00)

## 2018-01-10 DIAGNOSIS — R001 Bradycardia, unspecified: Secondary | ICD-10-CM | POA: Insufficient documentation

## 2018-01-13 ENCOUNTER — Other Ambulatory Visit: Payer: Self-pay | Admitting: Family

## 2018-01-13 ENCOUNTER — Other Ambulatory Visit: Payer: Self-pay | Admitting: Obstetrics and Gynecology

## 2018-01-13 DIAGNOSIS — R5383 Other fatigue: Secondary | ICD-10-CM

## 2018-01-13 DIAGNOSIS — Z3041 Encounter for surveillance of contraceptive pills: Secondary | ICD-10-CM

## 2018-01-14 ENCOUNTER — Telehealth: Payer: Self-pay | Admitting: Family

## 2018-01-14 NOTE — Telephone Encounter (Signed)
FYI

## 2018-01-14 NOTE — Telephone Encounter (Signed)
Result note read to patient; verbalizes understanding. States she did see cardiologist who will be sending over results of EKG. Pt states reported to her "Looked fine, nothing abnormal." Also states HR was 60 at appt.  Telephone encounter as result note was not routed to [facility].

## 2018-01-28 ENCOUNTER — Ambulatory Visit
Admission: RE | Admit: 2018-01-28 | Discharge: 2018-01-28 | Disposition: A | Discharge status: Home / Self Care | Payer: BC Managed Care – PPO | Source: Ambulatory Visit | Attending: Urology | Admitting: Urology

## 2018-01-28 ENCOUNTER — Ambulatory Visit (INDEPENDENT_AMBULATORY_CARE_PROVIDER_SITE_OTHER): Payer: BC Managed Care – PPO | Admitting: Urology

## 2018-01-28 VITALS — BP 113/73 | HR 65 | Ht 68.0 in | Wt 155.0 lb

## 2018-01-28 DIAGNOSIS — N200 Calculus of kidney: Secondary | ICD-10-CM | POA: Insufficient documentation

## 2018-01-28 DIAGNOSIS — R109 Unspecified abdominal pain: Secondary | ICD-10-CM

## 2018-01-28 LAB — URINALYSIS, COMPLETE
BILIRUBIN UA: NEGATIVE
GLUCOSE, UA: NEGATIVE
Ketones, UA: NEGATIVE
Leukocytes, UA: NEGATIVE
Nitrite, UA: NEGATIVE
PH UA: 7 (ref 5.0–7.5)
Protein, UA: NEGATIVE
SPEC GRAV UA: 1.01 (ref 1.005–1.030)
UUROB: 0.2 mg/dL (ref 0.2–1.0)

## 2018-01-28 NOTE — Progress Notes (Signed)
01/28/2018 3:23 PM   Margaret Mcfarland [birth year] [id number]  Referring provider: Burnard Hawthorne, FNP [address] [place], [place] [address]  Chief Complaint  Patient presents with  . Follow-up  . Nephrolithiasis    HPI: 47 year old female with an incidental 2 mm left lower pole kidney stone seen on CT scan 06/2017 he returns today with complaints of left flank pain.  She reports intermittent left flank pain which happens about every other day.  The pain is dull and aching.  Last for several hours and then resolves.  It does not appear to be positional.  No aggravating or alleviating factors.  No associated urinary symptoms.  No associated nausea or vomiting.  She also complains of some chronic midline back pain as well.  KUB shows a stable punctate left calculus.  No previous stone history.     PMH: Past Medical History:  Diagnosis Date  . Anxiety   . Blood in stool   . Cataract   . Chicken pox   . Endometriosis of uterus 05/23/1993   LAP  . Genital warts   . Kidney disease    per pt medical screening form  . Motion sickness    cars  . PONV (postoperative nausea and vomiting)    nausea only  . Retinal detachment   . Sciatica of left side     Surgical History: Past Surgical History:  Procedure Laterality Date  . CATARACT EXTRACTION W/PHACO Left 06/26/2017   Procedure: CATARACT EXTRACTION PHACO AND INTRAOCULAR LENS PLACEMENT ([place]) LEFT;  Surgeon: Leandrew Koyanagi, MD;  Location: [facility];  Service: Ophthalmology;  Laterality: Left;  . COLONOSCOPY N/A 08/25/2014   Procedure: COLONOSCOPY;  Surgeon: Manya Silvas, MD;  Location: [facility] ENDOSCOPY;  Service: Endoscopy;  Laterality: N/A;  . endoscopy    . GUM SURGERY  2008  . laproscopy      Home Medications:  Allergies as of 01/28/2018      Reactions   Sertraline Diarrhea      Medication List        Accurate as of 01/28/18  3:23 PM. Always use your most recent med list.          CALCIUM 600+D PO Take by mouth daily.   escitalopram 10 MG tablet Commonly known as:  LEXAPRO Take 1 tablet (10 mg total) by mouth daily.   JOLESSA 0.15-0.03 MG tablet Generic drug:  levonorgestrel-ethinyl estradiol TAKE 1 TABLET BY MOUTH ONCE DAILY   spironolactone 25 MG tablet Commonly known as:  ALDACTONE Take 25 mg by mouth daily.       Allergies:  Allergies  Allergen Reactions  . Sertraline Diarrhea    Family History: Family History  Problem Relation Age of Onset  . Colon cancer Father 79  . Arthritis Father   . Cancer Father        colon/prostate  . Hypertension Father   . Arthritis Mother   . Hyperlipidemia Mother   . Heart disease Mother   . Stroke Mother   . Hypertension Mother   . Kidney disease Mother   . Diabetes Mother        Type 1  . Congestive Heart Failure Mother   . Stroke Maternal Grandmother   . Cancer Maternal Grandfather        prostate  . Stroke Paternal Grandfather   . Breast cancer Cousin 52    Social History:  reports that she has never smoked. She has never used smokeless tobacco. She  reports that she does not drink alcohol or use drugs.  ROS: UROLOGY Frequent Urination?: No Hard to postpone urination?: No Burning/pain with urination?: No Get up at night to urinate?: No Leakage of urine?: No Urine stream starts and stops?: No Trouble starting stream?: No Do you have to strain to urinate?: No Blood in urine?: No Urinary tract infection?: No Sexually transmitted disease?: No Injury to kidneys or bladder?: No Painful intercourse?: No Weak stream?: No Currently pregnant?: No Vaginal bleeding?: No Last menstrual period?: n  Gastrointestinal Nausea?: No Vomiting?: No Indigestion/heartburn?: No Diarrhea?: No Constipation?: No  Constitutional Night sweats?: No Weight loss?: No Fatigue?: No  Skin Skin rash/lesions?: No Itching?: No  Eyes Blurred vision?: No Double vision?: No  Ears/Nose/Throat Sore throat?:  No Sinus problems?: No  Hematologic/Lymphatic Swollen glands?: No Easy bruising?: No  Cardiovascular Leg swelling?: No Chest pain?: No  Respiratory Cough?: No Shortness of breath?: No  Endocrine Excessive thirst?: No  Musculoskeletal Back pain?: Yes Joint pain?: No  Neurological Headaches?: Yes Dizziness?: No  Psychologic Depression?: No Anxiety?: Yes  Physical Exam: BP 113/73 (BP Location: Left Arm, Patient Position: Sitting, Cuff Size: Normal)   Pulse 65   Ht 5\' 8"  (1.727 m)   Wt 155 lb (70.3 kg)   BMI 23.57 kg/m   Constitutional:  Alert and oriented, No acute distress.  Sister today. HEENT: [place] AT, moist mucus membranes.  Trachea midline, no masses. Cardiovascular: No clubbing, cyanosis, or edema. Respiratory: Normal respiratory effort, no increased work of breathing. GI: Abdomen is soft, nontender, nondistended, no abdominal masses GU: No CVA tenderness Skin: No rashes, bruises or suspicious lesions. Neurologic: Grossly intact, no focal deficits, moving all 4 extremities. Psychiatric: Normal mood and affect.  Laboratory Data: Lab Results  Component Value Date   WBC 6.3 01/08/2018   HGB 14.0 01/08/2018   HCT 42.1 01/08/2018   MCV 91.3 01/08/2018   PLT 252.0 01/08/2018    Lab Results  Component Value Date   CREATININE 0.75 01/08/2018    Lab Results  Component Value Date   HGBA1C 5.5 01/08/2018    Urinalysis UA personally reviewed today.  There is trace lysed blood cells on dip but no red blood cells per high-powered field.  Pertinent Imaging: KUB personally reviewed.  There is a punctate stone in the left lower pole is barely visible.  No additional nephrolithiasis appreciated.  Assessment & Plan:    1. Left nephrolithiasis Punctate left lower pole stone which appears to be stable today a KUB  UA today without any evidence of blood Given extremely small size of the stone which is nonobstructing, I do not suspect this is the source of her  pain nor do I think that surgical intervention is warranted Suspect musculoskeletal pain - Abdomen 1 view (KUB); Future - Urinalysis, Complete  2. Left flank pain Suspect musculoskeletal pain Recommend heating pad and NSAIDs If she develops any other additional symptoms including fevers, chills, severe flank pain p.o. intolerance, return or seek urgent medical attention in the ER  Follow-up as needed  Hollice Espy, MD  [facility] [address] [place], [place] [address] [phone]

## 2018-01-29 ENCOUNTER — Encounter: Payer: Self-pay | Admitting: Obstetrics and Gynecology

## 2018-01-29 ENCOUNTER — Other Ambulatory Visit (HOSPITAL_COMMUNITY)
Admission: RE | Admit: 2018-01-29 | Discharge: 2018-01-29 | Disposition: A | Discharge status: Home / Self Care | Payer: BC Managed Care – PPO | Source: Ambulatory Visit | Attending: Obstetrics and Gynecology | Admitting: Obstetrics and Gynecology

## 2018-01-29 ENCOUNTER — Ambulatory Visit (INDEPENDENT_AMBULATORY_CARE_PROVIDER_SITE_OTHER): Payer: BC Managed Care – PPO | Admitting: Obstetrics and Gynecology

## 2018-01-29 VITALS — BP 86/56 | HR 67 | Ht 69.0 in | Wt 153.0 lb

## 2018-01-29 DIAGNOSIS — Z3041 Encounter for surveillance of contraceptive pills: Secondary | ICD-10-CM

## 2018-01-29 DIAGNOSIS — Z01419 Encounter for gynecological examination (general) (routine) without abnormal findings: Secondary | ICD-10-CM | POA: Diagnosis not present

## 2018-01-29 DIAGNOSIS — Z1239 Encounter for other screening for malignant neoplasm of breast: Secondary | ICD-10-CM

## 2018-01-29 DIAGNOSIS — Z124 Encounter for screening for malignant neoplasm of cervix: Secondary | ICD-10-CM | POA: Diagnosis present

## 2018-01-29 MED ORDER — LEVONORGEST-ETH ESTRAD 91-DAY 0.15-0.03 MG PO TABS: 1.0000 | ORAL_TABLET | Freq: Every day | ORAL | 3 refills | Status: DC

## 2018-01-29 NOTE — Progress Notes (Signed)
PCP:  Burnard Hawthorne, FNP   Chief Complaint  Patient presents with  . Gynecologic Exam    pt is interested in knowing if she's nearing premenopausal     HPI:      Margaret Mcfarland is a 47 y.o. No obstetric history on file. who LMP was Patient's last menstrual period was 10/16/2017 (exact date)., presents today for her annual examination.  Her menses are Q3 months with OCPs, lasting 1 day, very light.  Dysmenorrhea none. She does not have intermenstrual bleeding.  Sex activity: never Last Pap: 01/08/17  Results were: no abnormalities /neg HPV DNA. Likes yearly paps. Hx of STDs: none  Last mammogram: 02/13/17  Results were: normal--routine follow-up in 12 months. Has mammo sched. There is a FH of breast cancer in her mat cousin, genetic testing not indicated for pt. There is no FH of ovarian cancer. The patient does not do self-breast exams.  Tobacco use: The patient denies current or previous tobacco use. Alcohol use: none No drug use.  Exercise: moderately active  She does get adequate calcium and Vitamin D in her diet.  Pt had colonoscopy in 2016 with Dr. Vira Agar with polyps/hemorrhoids. Repeat due in 5 yrs due to [place] colon cancer in her dad. Doesn't qualify for cancer genetic testing.  Labs with PCP.    Past Medical History:  Diagnosis Date  . Anxiety   . Blood in stool   . Cataract   . Chicken pox   . Endometriosis of uterus 05/23/1993   LAP  . Genital warts   . Kidney disease    per pt medical screening form  . Kidney stone   . Motion sickness    cars  . PONV (postoperative nausea and vomiting)    nausea only  . Retinal detachment   . Sciatica of left side     Past Surgical History:  Procedure Laterality Date  . CATARACT EXTRACTION W/PHACO Left 06/26/2017   Procedure: CATARACT EXTRACTION PHACO AND INTRAOCULAR LENS PLACEMENT ([place]) LEFT;  Surgeon: Leandrew Koyanagi, MD;  Location: [facility];  Service: Ophthalmology;  Laterality: Left;  .  COLONOSCOPY N/A 08/25/2014   Procedure: COLONOSCOPY;  Surgeon: Manya Silvas, MD;  Location: [facility] ENDOSCOPY;  Service: Endoscopy;  Laterality: N/A;  . endoscopy    . GUM SURGERY  2008  . laproscopy      Family History  Problem Relation Age of Onset  . Colon cancer Father 10  . Arthritis Father   . Cancer Father        colon/prostate  . Hypertension Father   . Arthritis Mother   . Hyperlipidemia Mother   . Heart disease Mother   . Stroke Mother   . Hypertension Mother   . Kidney disease Mother   . Diabetes Mother        Type 1  . Congestive Heart Failure Mother   . Stroke Maternal Grandmother   . Cancer Maternal Grandfather        prostate  . Stroke Paternal Grandfather   . Breast cancer Cousin 25    Social History   Socioeconomic History  . Marital status: Single    Spouse name: Not on file  . Number of children: Not on file  . Years of education: Not on file  . Highest education level: Not on file  Occupational History  . Not on file  Social Needs  . Financial resource strain: Not on file  . Food insecurity:    Worry:  Not on file    Inability: Not on file  . Transportation needs:    Medical: Not on file    Non-medical: Not on file  Tobacco Use  . Smoking status: Never Smoker  . Smokeless tobacco: Never Used  Substance and Sexual Activity  . Alcohol use: No    Alcohol/week: 0.0 standard drinks  . Drug use: No  . Sexual activity: Never  Lifestyle  . Physical activity:    Days per week: Not on file    Minutes per session: Not on file  . Stress: Not on file  Relationships  . Social connections:    Talks on phone: Not on file    Gets together: Not on file    Attends religious service: Not on file    Active member of club or organization: Not on file    Attends meetings of clubs or organizations: Not on file    Relationship status: Not on file  . Intimate partner violence:    Fear of current or ex partner: Not on file    Emotionally abused: Not on  file    Physically abused: Not on file    Forced sexual activity: Not on file  Other Topics Concern  . Not on file  Social History Narrative   Lives with twin sister   Work- [employer]    No pets    No children    Right handed    No caffeine daily- tea occasionally; eats chocolate    Enjoys- shopping and eating out     Current Meds  Medication Sig  . Calcium Carbonate-Vitamin D (CALCIUM 600+D PO) Take by mouth daily.  Marland Kitchen escitalopram (LEXAPRO) 10 MG tablet Take 1 tablet (10 mg total) by mouth daily.  Marland Kitchen levonorgestrel-ethinyl estradiol (JOLESSA) 0.15-0.03 MG tablet Take 1 tablet by mouth daily.  Marland Kitchen spironolactone (ALDACTONE) 25 MG tablet Take 25 mg by mouth daily.  . [DISCONTINUED] JOLESSA 0.15-0.03 MG tablet TAKE 1 TABLET BY MOUTH ONCE DAILY     ROS:  Review of Systems  Constitutional: Negative for fatigue, fever and unexpected weight change.  Respiratory: Negative for cough, shortness of breath and wheezing.   Cardiovascular: Negative for chest pain, palpitations and leg swelling.  Gastrointestinal: Positive for constipation. Negative for blood in stool, diarrhea, nausea and vomiting.  Endocrine: Negative for cold intolerance, heat intolerance and polyuria.  Genitourinary: Negative for dyspareunia, dysuria, flank pain, frequency, genital sores, hematuria, menstrual problem, pelvic pain, urgency, vaginal bleeding, vaginal discharge and vaginal pain.  Musculoskeletal: Negative for back pain, joint swelling and myalgias.  Skin: Negative for rash.  Neurological: Positive for headaches. Negative for dizziness, syncope, light-headedness and numbness.  Hematological: Negative for adenopathy.  Psychiatric/Behavioral: Positive for agitation. Negative for confusion, sleep disturbance and suicidal ideas. The patient is not nervous/anxious.      Objective: BP (!) 86/56   Pulse 67   Ht 5\' 9"  (1.753 m)   Wt 153 lb (69.4 kg)   LMP 10/16/2017 (Exact Date)   BMI  22.59 kg/m    Physical Exam  Constitutional: She is oriented to person, place, and time. She appears well-developed and well-nourished.  Genitourinary: Vagina normal and uterus normal. There is no rash or tenderness on the right labia. There is no rash or tenderness on the left labia. No erythema or tenderness in the vagina. No vaginal discharge found. Right adnexum does not display mass and does not display tenderness. Left adnexum does not display mass and does not display  tenderness. Cervix does not exhibit motion tenderness or polyp. Uterus is not enlarged or tender.  Neck: Normal range of motion. No thyromegaly present.  Cardiovascular: Normal rate, regular rhythm and normal heart sounds.  No murmur heard. Pulmonary/Chest: Effort normal and breath sounds normal. Right breast exhibits no mass, no nipple discharge, no skin change and no tenderness. Left breast exhibits no mass, no nipple discharge, no skin change and no tenderness.  Abdominal: Soft. There is no tenderness. There is no guarding.  Musculoskeletal: Normal range of motion.  Neurological: She is alert and oriented to person, place, and time. No cranial nerve deficit.  Psychiatric: She has a normal mood and affect. Her behavior is normal.  Vitals reviewed.   Assessment/Plan: Encounter for annual routine gynecological examination  Cervical cancer screening - Plan: Cytology - PAP  Screening for breast cancer - Pt has mammo sched  Encounter for surveillance of contraceptive pills - OCP RF. Perimenopause discussed. Will re-eval need for OCPs next yr.  - Plan: levonorgestrel-ethinyl estradiol (JOLESSA) 0.15-0.03 MG tablet  Meds ordered this encounter  Medications  . levonorgestrel-ethinyl estradiol (JOLESSA) 0.15-0.03 MG tablet    Sig: Take 1 tablet by mouth daily.    Dispense:  91 tablet    Refill:  3    Order Specific Question:   Supervising Provider    Answer:   Gae Dry [[id number]]             GYN counsel  mammography screening, adequate intake of calcium and vitamin D, diet and exercise     F/U  Return in about 1 year (around 01/30/2019).  Alicia B. Copland, PA-C 01/29/2018 11:06 AM

## 2018-01-29 NOTE — Patient Instructions (Signed)
I value your feedback and entrusting us with your care. If you get a [facility] patient survey, I would appreciate you taking the time to let us know about your experience today. Thank you! 

## 2018-02-04 LAB — CYTOLOGY - PAP
ADEQUACY: ABSENT
Diagnosis: NEGATIVE

## 2018-02-07 ENCOUNTER — Ambulatory Visit (INDEPENDENT_AMBULATORY_CARE_PROVIDER_SITE_OTHER): Payer: BC Managed Care – PPO | Admitting: Family Medicine

## 2018-02-07 ENCOUNTER — Encounter: Payer: Self-pay | Admitting: Family Medicine

## 2018-02-07 VITALS — BP 100/78 | HR 73 | Temp 98.2°F | Ht 69.0 in | Wt 156.4 lb

## 2018-02-07 DIAGNOSIS — J040 Acute laryngitis: Secondary | ICD-10-CM

## 2018-02-07 MED ORDER — METHYLPREDNISOLONE ACETATE 40 MG/ML IJ SUSP
40.0000 mg | Freq: Once | INTRAMUSCULAR | Status: AC
  Administered 2018-02-07: 40 mg via INTRAMUSCULAR

## 2018-02-07 MED ORDER — METHYLPREDNISOLONE 4 MG PO TBPK: ORAL_TABLET | ORAL | 0 refills | Status: DC

## 2018-02-07 NOTE — Progress Notes (Signed)
Subjective:    Patient ID: Margaret Mcfarland, female    DOB: 07-27-1970, 47 y.o.   MRN: [id number]  HPI  Presents to clinic c/o loss of voice, sore/dry throat for 2-3 days. Patient has been taking OTC antihistamine for the past few days, but voice continues to be raspy.   Denies any fever or chills.  Denies coughing or phlegm production.  Denies trouble swallowing.  Denies drooling.  Patient Active Problem List   Diagnosis Date Noted  . Other fatigue 01/08/2018  . Acute sinusitis 05/01/2017  . Headache 04/24/2017  . Weight gain 04/24/2017  . Encounter for medical examination to establish care 10/16/2016  . Chronic left-sided low back pain without sciatica 10/16/2016  . Chest pain 10/16/2016  . Anxiety 07/18/2016  . Poison ivy dermatitis 07/18/2016  . Missed period 07/18/2016  . Purpura ([place]) 07/06/2015  . B12 deficiency 08/16/2014   Social History   Tobacco Use  . Smoking status: Never Smoker  . Smokeless tobacco: Never Used  Substance Use Topics  . Alcohol use: No    Alcohol/week: 0.0 standard drinks   Review of Systems  Constitutional: Negative for chills, fatigue and fever.  HENT: +sore throat and loss of voice.  Eyes: Negative.   Respiratory: Negative for cough, shortness of breath and wheezing.   Cardiovascular: Negative for chest pain, palpitations and leg swelling.  Gastrointestinal: Negative for abdominal pain, diarrhea, nausea and vomiting.  Genitourinary: Negative for dysuria, frequency and urgency.  Musculoskeletal: Negative for arthralgias and myalgias.  Skin: Negative for color change, pallor and rash.  Neurological: Negative for syncope, light-headedness and headaches.  Psychiatric/Behavioral: The patient is not nervous/anxious.       Objective:   Physical Exam  Constitutional: She is oriented to person, place, and time. She appears well-nourished. No distress.  HENT:  Head: Normocephalic and atraumatic.  Mouth/Throat: No oropharyngeal exudate.  Mild  post nasal drip. Tonsils are not red or swollen.   Eyes: Conjunctivae and EOM are normal. No scleral icterus.  Neck: Neck supple. No tracheal deviation present.  Cardiovascular: Normal rate and regular rhythm.  Pulmonary/Chest: Effort normal and breath sounds normal. No respiratory distress.  Lymphadenopathy:    She has no cervical adenopathy.  Neurological: She is alert and oriented to person, place, and time.  Skin: Skin is warm and dry. No pallor.  Psychiatric: She has a normal mood and affect. Her behavior is normal.  Nursing note and vitals reviewed.  Vitals:   02/07/18 1431  BP: 100/78  Pulse: 73  Temp: 98.2 F (36.8 C)  SpO2: 97%      Assessment & Plan:   Laryngitis - patient's throat exam is unremarkable other than some postnasal drip.  Patient advised to continue using allergy medicine to help combat this.  She will also take steroid taper to help reduce inflammation and help improve voice to return.  Patient advised to rest voice is much as possible, keep up good fluid intake and can try drinking warm liquids to soothe back of throat.  Patient given IM injection of 40 mg of methylprednisolone to help boost inflammation reduction.  Patient is not around any young children and throat exam does not have any exudates, so patient and I both agree that strep throat swab is not necessary at this time. Administrations This Visit    methylPREDNISolone acetate (DEPO-MEDROL) injection 40 mg    Admin Date 02/07/2018 Action Given Dose 40 mg Route Intramuscular Administered By Neta Ehlers, RMA  Patient will keep regularly scheduled follow-up PCP as planned.  Advised that if current symptoms persist or worsen she can call office and let us know.  Patient also aware she can return to clinic at any time if new issues arise.

## 2018-02-07 NOTE — Patient Instructions (Signed)
Laryngitis Laryngitis is swelling (inflammation) of your vocal cords. This causes hoarseness, coughing, loss of voice, sore throat, or a dry throat. When your vocal cords are inflamed, your voice sounds different. Laryngitis can be temporary (acute) or long-term (chronic). Most cases of acute laryngitis improve with time. Chronic laryngitis is laryngitis that lasts for more than three weeks. Follow these instructions at home:  Drink enough fluid to keep your pee (urine) clear or pale yellow.  Breathe in moist air. Use a humidifier if you live in a dry climate.  Take medicines only as told by your doctor.  Talk as little as possible. Also avoid whispering, which can cause vocal strain.  Write instead of talking. Do this until your voice is back to normal. Contact a doctor if:  You have a fever.  Your pain is worse.  You have trouble swallowing. Get help right away if:  You cough up blood.  You have trouble breathing. This information is not intended to replace advice given to you by your health care provider. Make sure you discuss any questions you have with your health care provider. Document Released: 02/08/2011 Document Revised: 07/28/2015 Document Reviewed: 08/04/2013 Elsevier Interactive Patient Education  [employer].

## 2018-02-10 ENCOUNTER — Telehealth: Payer: Self-pay | Admitting: Family

## 2018-02-10 DIAGNOSIS — R059 Cough, unspecified: Secondary | ICD-10-CM

## 2018-02-10 DIAGNOSIS — R05 Cough: Secondary | ICD-10-CM

## 2018-02-10 MED ORDER — BENZONATATE 100 MG PO CAPS: 100.0000 mg | ORAL_CAPSULE | Freq: Three times a day (TID) | ORAL | 0 refills | Status: DC | PRN

## 2018-02-10 NOTE — Telephone Encounter (Signed)
Called Pt to tell her that the Osborne Casco sent to pharmacy

## 2018-02-10 NOTE — Telephone Encounter (Signed)
Tessalon perles sent to pharmacy.

## 2018-02-10 NOTE — Telephone Encounter (Signed)
Copied from [facility] [id number]. Topic: General - Other >> Feb 10, 2018  3:22 PM Lennox Solders wrote: Reason for CRM:pt saw  lauren on 02-07-18 for laryngitis. Pt would like something for the cough  at times pt coughs  up mucous . [facility] [address] in [place]

## 2018-02-10 NOTE — Telephone Encounter (Signed)
Reason for CRM:pt saw  lauren on 02-07-18 for laryngitis. Pt would like something for the cough  at times pt coughs  up mucous . [facility] [address] in [place]

## 2018-02-11 ENCOUNTER — Ambulatory Visit (INDEPENDENT_AMBULATORY_CARE_PROVIDER_SITE_OTHER): Payer: BC Managed Care – PPO | Admitting: Family Medicine

## 2018-02-11 ENCOUNTER — Encounter: Payer: Self-pay | Admitting: Family Medicine

## 2018-02-11 VITALS — BP 98/62 | HR 67 | Temp 97.5°F | Ht 69.0 in | Wt 155.8 lb

## 2018-02-11 DIAGNOSIS — R0982 Postnasal drip: Secondary | ICD-10-CM | POA: Diagnosis not present

## 2018-02-11 DIAGNOSIS — J019 Acute sinusitis, unspecified: Secondary | ICD-10-CM | POA: Diagnosis not present

## 2018-02-11 MED ORDER — LORATADINE 10 MG PO TABS: 10.0000 mg | ORAL_TABLET | Freq: Every day | ORAL | 1 refills | Status: DC

## 2018-02-11 MED ORDER — FLUTICASONE PROPIONATE 50 MCG/ACT NA SUSP: 2.0000 | Freq: Every day | NASAL | 6 refills | Status: DC

## 2018-02-11 MED ORDER — AMOXICILLIN-POT CLAVULANATE 875-125 MG PO TABS: 1.0000 | ORAL_TABLET | Freq: Two times a day (BID) | ORAL | 0 refills | Status: DC

## 2018-02-11 NOTE — Progress Notes (Signed)
Subjective:    Patient ID: Margaret Mcfarland, female    DOB: December 28, 1970, 47 y.o.   MRN: [id number]  HPI  Presents to clinic for laryngitis follow up. She was seen on 02/07/18 and prescribed steroids to help improve laryngitis/loss of voice.  Patient also did have some nasal congestion when she was seen here for her laryngitis, but it was not this point where bothered her enough to be too concerned.  Patient states the congestion has become very thick, it is draining down the back of her throat and when she blows her nose thick green-yellow mucus is coming out.  Patient thought she was getting better for 1 day, but symptoms seem to only be getting worse now.  Patient states her head feels very full and heavy.  Denies fever or chills.  Denies nausea, vomiting or diarrhea.  Patient Active Problem List   Diagnosis Date Noted  . Other fatigue 01/08/2018  . Acute sinusitis 05/01/2017  . Headache 04/24/2017  . Weight gain 04/24/2017  . Encounter for medical examination to establish care 10/16/2016  . Chronic left-sided low back pain without sciatica 10/16/2016  . Chest pain 10/16/2016  . Anxiety 07/18/2016  . Poison ivy dermatitis 07/18/2016  . Missed period 07/18/2016  . Purpura ([place]) 07/06/2015  . B12 deficiency 08/16/2014   Social History   Tobacco Use  . Smoking status: Never Smoker  . Smokeless tobacco: Never Used  Substance Use Topics  . Alcohol use: No    Alcohol/week: 0.0 standard drinks   Review of Systems  Constitutional: Negative for chills, fatigue and fever.  HENT: +congestion, ear pain, sinus pain/drainage and sore throat.   Eyes: Negative.   Respiratory: Negative for cough, shortness of breath and wheezing.   Cardiovascular: Negative for chest pain, palpitations and leg swelling.  Gastrointestinal: Negative for abdominal pain, diarrhea, nausea and vomiting.  Genitourinary: Negative for dysuria, frequency and urgency.  Musculoskeletal: Negative for arthralgias and  myalgias.  Skin: Negative for color change, pallor and rash.  Neurological: Negative for syncope, light-headedness and headaches.  Psychiatric/Behavioral: The patient is not nervous/anxious.       Objective:   Physical Exam  Constitutional: She is oriented to person, place, and time. She appears well-nourished. No distress.  HENT:  Head: Normocephalic and atraumatic.  +fullness bilat TMs +facial and maxillary sinus tenderness +PND +thick yellow nasal discharge +nasal tenderness/mucosal edema  Voice still scratchy, but better  Eyes: Conjunctivae and EOM are normal. No scleral icterus.  Neck: Neck supple. No tracheal deviation present.  Cervical gland tenderness  Cardiovascular: Normal rate and regular rhythm.  Pulmonary/Chest: Effort normal and breath sounds normal.  Musculoskeletal: She exhibits no edema.  Neurological: She is alert and oriented to person, place, and time.  Skin: Skin is warm and dry. No pallor.  Psychiatric: She has a normal mood and affect. Her behavior is normal.  Nursing note and vitals reviewed.     Vitals:   02/11/18 1445  BP: 98/62  Pulse: 67  Temp: (!) 97.5 F (36.4 C)  SpO2: 96%   Assessment & Plan:   Acute sinusitis/postnasal drip-patient will take Augmentin twice daily for 10 days to treat sinus infection.  She will also use Flonase nasal spray to help open up sinuses and get things draining.  Also advised she can try using a Nettie pot to rinse out sinus congestion.  Advised to increase fluid intake, get some good rest and do good handwashing.  Patient given note for out of work tomorrow  to use to allow herself time to rest.  Patient will keep regularly scheduled follow-up with PCP as planned.  Advised to return to clinic sooner if any issues arise or if current symptoms persist or worsen.

## 2018-02-12 ENCOUNTER — Encounter: Payer: Self-pay | Admitting: Family Medicine

## 2018-02-14 ENCOUNTER — Ambulatory Visit: Payer: Self-pay | Admitting: *Deleted

## 2018-02-14 DIAGNOSIS — R110 Nausea: Secondary | ICD-10-CM

## 2018-02-14 MED ORDER — ONDANSETRON 4 MG PO TBDP: 8.0000 mg | ORAL_TABLET | Freq: Three times a day (TID) | ORAL | 1 refills | Status: DC | PRN

## 2018-02-14 MED ORDER — PROBIOTIC COMPLEX ACIDOPHILUS PO CAPS: 1.0000 | ORAL_CAPSULE | Freq: Every day | ORAL | 1 refills | Status: DC

## 2018-02-14 NOTE — Telephone Encounter (Signed)
I sent in zofran tablets that dissolve under the tongue, this work quickly to help calm upset stomach  Also augmentin sometime can be a tough antibiotic on the stomach so I sent in a daily probiotic capsule also that can help.  I would advise she eat a bland diet (toast, crackers, ramen noodles) and clear liquids (water, gatorade, pedialyte, ginger ale, jello) for the next 1-2 days and slowly advance diet as tolerated  If her ABD pain gets worse over the weekend, she develops fever, vomiting or diarrhea - please go to urgent care for evaluation

## 2018-02-14 NOTE — Telephone Encounter (Signed)
Message from Berneta Levins sent at 02/14/2018 3:23 PM EST   Summary: what can pt do for a stomach ache   Pt states that when she was seen by doctor she was told that there was mucus draining to her stomach. Pt states that this is now giving her a stomach ache and she wants to know what she can take for that.           Left message for pt to return call to the office to discuss symptoms.

## 2018-02-14 NOTE — Telephone Encounter (Signed)
Patient called in with c/o "abdominal pain." She says "my stomach hurts in the middle and has been hurting since Monday or Tuesday. I mentioned it when I came in the office to see [name] on Tuesday and she said it was coming from the drainage from my nose to the back of my throat. I don't have that much drainage now, but my stomach still hurts. The pain is an ache at a 7-8. I've had this pain before when I had a stomach virus. I want to know if [name] can call me in something for my stomach ache." I asked about other symptoms, she denies. According to protocol, see PCP within 24 hours. She says "will you just send it to [facility] and ask what can I take?" I advised someone from the office will call with [name]'s recommendation.   Reason for Disposition . [1] MODERATE pain (e.g., interferes with normal activities) AND [2] pain comes and goes (cramps) AND [3] present > 24 hours  (Exception: pain with Vomiting or Diarrhea - see that Guideline)  Answer Assessment - Initial Assessment Questions 1. LOCATION: "Where does it hurt?"      Middle of the stomach 2. RADIATION: "Does the pain shoot anywhere else?" (e.g., chest, back)     No 3. ONSET: "When did the pain begin?" (e.g., minutes, hours or days ago)      Since Monday or Tuesday 4. SUDDEN: "Gradual or sudden onset?"     Gradual 5. PATTERN "Does the pain come and go, or is it constant?"    - If constant: "Is it getting better, staying the same, or worsening?"      (Note: Constant means the pain never goes away completely; most serious pain is constant and it progresses)     - If intermittent: "How long does it last?" "Do you have pain now?"     (Note: Intermittent means the pain goes away completely between bouts)     Constant 6. SEVERITY: "How bad is the pain?"  (e.g., Scale 1-10; mild, moderate, or severe)   - MILD (1-3): doesn't interfere with normal activities, abdomen soft and not tender to touch    - MODERATE (4-7): interferes with normal  activities or awakens from sleep, tender to touch    - SEVERE (8-10): excruciating pain, doubled over, unable to do any normal activities      7-8 7. RECURRENT SYMPTOM: "Have you ever had this type of abdominal pain before?" If so, ask: "When was the last time?" and "What happened that time?"      Yes when I've had a stomach virus 8. CAUSE: "What do you think is causing the abdominal pain?"     I don't know 9. RELIEVING/AGGRAVATING FACTORS: "What makes it better or worse?" (e.g., movement, antacids, bowel movement)     Laying down helps it to feel better, but sometimes it still hurts 10. OTHER SYMPTOMS: "Has there been any vomiting, diarrhea, constipation, or urine problems?"       No 11. PREGNANCY: "Is there any chance you are pregnant?" "When was your last menstrual period?"       No  Protocols used: ABDOMINAL PAIN - [facility]

## 2018-02-14 NOTE — Telephone Encounter (Signed)
Please advise 

## 2018-02-14 NOTE — Telephone Encounter (Signed)
I called & spoke with patient's sister(who was in car with patient). Message was relayed & understanding verbalized.

## 2018-02-24 ENCOUNTER — Ambulatory Visit
Admission: RE | Admit: 2018-02-24 | Discharge: 2018-02-24 | Disposition: A | Discharge status: Home / Self Care | Payer: BC Managed Care – PPO | Source: Ambulatory Visit | Attending: Obstetrics and Gynecology | Admitting: Obstetrics and Gynecology

## 2018-02-24 ENCOUNTER — Ambulatory Visit: Payer: BC Managed Care – PPO | Admitting: Family

## 2018-02-24 ENCOUNTER — Encounter: Payer: Self-pay | Admitting: Obstetrics and Gynecology

## 2018-02-24 DIAGNOSIS — Z1231 Encounter for screening mammogram for malignant neoplasm of breast: Secondary | ICD-10-CM | POA: Insufficient documentation

## 2018-02-28 ENCOUNTER — Ambulatory Visit: Payer: BC Managed Care – PPO | Admitting: Family

## 2018-02-28 ENCOUNTER — Telehealth: Payer: Self-pay | Admitting: Family

## 2018-02-28 NOTE — Telephone Encounter (Signed)
Called Pt and she will be going  Saturday 03/01/18 @ 11:00 to [employer] office in [place] @11 :00 to be seen

## 2018-02-28 NOTE — Telephone Encounter (Signed)
Copied from [place] [phone]. Topic: Quick Communication - See Telephone Encounter >> Feb 28, 2018  1:02 PM Conception Chancy, NT wrote: CRM for notification. See Telephone encounter for: 02/28/18.  Patient is calling and states that she was prescribed amoxicillin-clavulanate (AUGMENTIN) 875-125 MG tablet and is still having mucus, congestions, headache, and losing voice. Patient would like to know if something else can be called in since she has finished that antibiotic.

## 2018-02-28 NOTE — Telephone Encounter (Signed)
noted 

## 2018-02-28 NOTE — Telephone Encounter (Signed)
Please call patient. I need more information than this. I do  Not routinely prescribed antibiotic after antibiotic as this can lead to very serious diarrheal infections and resistance.  Is she at all better?   Some symptoms can linger even after the bacterial component is taken care of. Does she have fever?   I would actually prefer her to be reevaluated if she still having symptoms.    Also would advise that she is staying well-hydrated and using plain Mucinex to break up her congestion.

## 2018-03-01 ENCOUNTER — Ambulatory Visit (INDEPENDENT_AMBULATORY_CARE_PROVIDER_SITE_OTHER): Payer: BC Managed Care – PPO | Admitting: Primary Care

## 2018-03-01 DIAGNOSIS — J309 Allergic rhinitis, unspecified: Secondary | ICD-10-CM | POA: Diagnosis not present

## 2018-03-01 NOTE — Assessment & Plan Note (Signed)
Nasal congestion and post nasal drip intermittently for weeks. Treated with Augmentin and now with return of symptoms. Exam and presentation do not suggest bacterial involvement, especially given recent course of Augmentin. Long discussion today regarding allergies and various treatments. Discussed also that given recent abrupt weather changes these symptoms can occur.  Will have her start Xyzal consistently for several weeks and Flonase consistently x 1 week. Follow up with PCP if no improvement, consider Singulair if needed.

## 2018-03-01 NOTE — Progress Notes (Signed)
Subjective:    Patient ID: Margaret Mcfarland, female    DOB: Mar 01, 1971, 47 y.o.   MRN: [id number]  HPI  Margaret Mcfarland is a 47 year old female with a history of sinusitis who presents today with a chief complaint of cough.  She also reports, drainage, body aches, rhinorrhea, head congestion.  She was originally evaluated on 02/07/18 for complaints of sore throat, loss of voice x 2-3 days. Her exam that day was suspected to be allergy induced so she was treated with IM steroids and other conservative measures. She was last evaluated on 02/11/18 with continued symptoms of post nasal drip with thick mucous, also with headache. Her exam that day was suspicious for acute sinusitis so she was treated with a 10 day course of oral Augmentin. She did phone in several days later with complaints of abdominal discomfort since treatment with antibiotics, she was prescribed Zofran to use PRN.  Since her last visit and treatment with Augmentin and completed the course, noticed temporary improvement but then a return in symptom. She's since noticed a cough, headache, post nasal drip that began about one week or so ago. She denies fevers. She's taken an OTC "allergy medication" and cough medication off and on since symptoms.   Review of Systems  Constitutional: Negative for chills, fatigue and fever.  HENT: Positive for congestion and postnasal drip. Negative for sinus pressure and sore throat.   Respiratory: Positive for cough. Negative for shortness of breath and wheezing.        Past Medical History:  Diagnosis Date  . Anxiety   . Blood in stool   . Cataract   . Chicken pox   . Endometriosis of uterus 05/23/1993   LAP  . Genital warts   . Kidney disease    per pt medical screening form  . Kidney stone   . Motion sickness    cars  . PONV (postoperative nausea and vomiting)    nausea only  . Retinal detachment   . Sciatica of left side      Social History   Socioeconomic History  . Marital status:  Single    Spouse name: Not on file  . Number of children: Not on file  . Years of education: Not on file  . Highest education level: Not on file  Occupational History  . Not on file  Social Needs  . Financial resource strain: Not on file  . Food insecurity:    Worry: Not on file    Inability: Not on file  . Transportation needs:    Medical: Not on file    Non-medical: Not on file  Tobacco Use  . Smoking status: Never Smoker  . Smokeless tobacco: Never Used  Substance and Sexual Activity  . Alcohol use: No    Alcohol/week: 0.0 standard drinks  . Drug use: No  . Sexual activity: Never  Lifestyle  . Physical activity:    Days per week: Not on file    Minutes per session: Not on file  . Stress: Not on file  Relationships  . Social connections:    Talks on phone: Not on file    Gets together: Not on file    Attends religious service: Not on file    Active member of club or organization: Not on file    Attends meetings of clubs or organizations: Not on file    Relationship status: Not on file  . Intimate partner violence:    Fear of current  or ex partner: Not on file    Emotionally abused: Not on file    Physically abused: Not on file    Forced sexual activity: Not on file  Other Topics Concern  . Not on file  Social History Narrative   Lives with twin sister   Work- [employer]    No pets    No children    Right handed    No caffeine daily- tea occasionally; eats chocolate    Enjoys- shopping and eating out     Past Surgical History:  Procedure Laterality Date  . CATARACT EXTRACTION W/PHACO Left 06/26/2017   Procedure: CATARACT EXTRACTION PHACO AND INTRAOCULAR LENS PLACEMENT ([place]) LEFT;  Surgeon: Leandrew Koyanagi, MD;  Location: [facility];  Service: Ophthalmology;  Laterality: Left;  . COLONOSCOPY N/A 08/25/2014   Procedure: COLONOSCOPY;  Surgeon: Manya Silvas, MD;  Location: [facility] ENDOSCOPY;  Service: Endoscopy;  Laterality:  N/A;  . endoscopy    . GUM SURGERY  2008  . laproscopy      Family History  Problem Relation Age of Onset  . Colon cancer Father 25  . Arthritis Father   . Cancer Father        colon/prostate  . Hypertension Father   . Arthritis Mother   . Hyperlipidemia Mother   . Heart disease Mother   . Stroke Mother   . Hypertension Mother   . Kidney disease Mother   . Diabetes Mother        Type 1  . Congestive Heart Failure Mother   . Stroke Maternal Grandmother   . Cancer Maternal Grandfather        prostate  . Stroke Paternal Grandfather   . Breast cancer Cousin 35    Allergies  Allergen Reactions  . Sertraline Diarrhea    Current Outpatient Medications on File Prior to Visit  Medication Sig Dispense Refill  . amoxicillin-clavulanate (AUGMENTIN) 875-125 MG tablet Take 1 tablet by mouth 2 (two) times daily. 20 tablet 0  . benzonatate (TESSALON) 100 MG capsule Take 1 capsule (100 mg total) by mouth 3 (three) times daily as needed for cough. 30 capsule 0  . Calcium Carbonate-Vitamin D (CALCIUM 600+D PO) Take by mouth daily.    Marland Kitchen escitalopram (LEXAPRO) 10 MG tablet Take 1 tablet (10 mg total) by mouth daily. 90 tablet 1  . fluticasone (FLONASE) 50 MCG/ACT nasal spray Place 2 sprays into both nostrils daily. 16 g 6  . levonorgestrel-ethinyl estradiol (JOLESSA) 0.15-0.03 MG tablet Take 1 tablet by mouth daily. 91 tablet 3  . loratadine (CLARITIN) 10 MG tablet Take 1 tablet (10 mg total) by mouth daily. 30 tablet 1  . methylPREDNISolone (MEDROL DOSEPAK) 4 MG TBPK tablet Take according to pack instructions 21 tablet 0  . ondansetron (ZOFRAN ODT) 4 MG disintegrating tablet Take 2 tablets (8 mg total) by mouth every 8 (eight) hours as needed for nausea or vomiting. 30 tablet 1  . Probiotic Product (PROBIOTIC COMPLEX ACIDOPHILUS) CAPS Take 1 capsule by mouth daily. 30 capsule 1  . spironolactone (ALDACTONE) 25 MG tablet Take 25 mg by mouth daily.     No current facility-administered  medications on file prior to visit.     BP 112/80 (BP Location: Left Arm, Patient Position: Sitting, Cuff Size: Normal)   Pulse (!) 56   Temp 97.9 F (36.6 C) (Oral)   Wt 151 lb (68.5 kg)   SpO2 98%   BMI 22.30 kg/m    Objective:  Physical Exam  Constitutional: She appears well-nourished. She does not appear ill.  HENT:  Right Ear: Tympanic membrane and ear canal normal.  Left Ear: Tympanic membrane and ear canal normal.  Nose: No mucosal edema. Right sinus exhibits no maxillary sinus tenderness and no frontal sinus tenderness. Left sinus exhibits no maxillary sinus tenderness and no frontal sinus tenderness.  Mouth/Throat: Oropharynx is clear and moist.  Neck: Neck supple.  Cardiovascular: Normal rate and regular rhythm.  Respiratory: Effort normal and breath sounds normal. She has no wheezes.  Skin: Skin is warm and dry.           Assessment & Plan:

## 2018-03-01 NOTE — Patient Instructions (Signed)
Start levocetirizine (Xyzal) allergy medicine for throat drainage, nasal congestion. Take 1 tablet by mouth every night at bedtime. Try this for at least one month.  Nasal Congestion/Ear Pressure/Sinus Pressure: Try using Flonase (fluticasone) nasal spray. Instill 1 spray in each nostril twice daily. Try this for one week.  Please follow up with [name] if no improvement.  It was a pleasure to see you today!

## 2018-03-24 ENCOUNTER — Institutional Professional Consult (permissible substitution): Payer: BC Managed Care – PPO | Admitting: Neurology

## 2018-03-24 ENCOUNTER — Encounter: Payer: Self-pay | Admitting: Neurology

## 2018-03-24 ENCOUNTER — Ambulatory Visit: Payer: BC Managed Care – PPO | Admitting: Neurology

## 2018-03-24 VITALS — BP 104/72 | HR 73 | Ht 69.0 in | Wt 153.0 lb

## 2018-03-24 DIAGNOSIS — G4710 Hypersomnia, unspecified: Secondary | ICD-10-CM | POA: Diagnosis not present

## 2018-03-24 DIAGNOSIS — R51 Headache: Secondary | ICD-10-CM

## 2018-03-24 DIAGNOSIS — R0683 Snoring: Secondary | ICD-10-CM

## 2018-03-24 DIAGNOSIS — R519 Headache, unspecified: Secondary | ICD-10-CM

## 2018-03-24 NOTE — Patient Instructions (Signed)
I believe you may have an underlying sleepiness condition. This means, that you have a sleep disorder that manifests with excessive sleep and excessive sleepiness during the day.  We will do sleep testing at this point: I would like for you to come back for an overnight sleep study during which we will monitor your night time sleep and we will do nap study testing the next day: 5 scheduled 20 min nap opportunities, every 2 hours. We will remind you to stay awake in between naps.    As explained, you will have to be off of your antidepressant medication, Lexapro, in preparation for the sleep studies, you would have to be off it for at least 2 weeks, you may have to taper it first and then stop it, please talk to Mable Paris.

## 2018-03-24 NOTE — Progress Notes (Signed)
Subjective:    Patient ID: Margaret Mcfarland is a 48 y.o. female.  HPI    Star Age, MD, PhD [facility] [address], [address].O. [place], [place] [address]  Dear [name],  I saw your patient, Margaret Mcfarland, upon your kind request in the sleep clinic today for initial consultation of her sleep disorder, in particular, her daytime tiredness. The patient is unaccompanied today. As you know, [name] is a 48 year old right-handed woman with an underlying medical history of cataracts with status post surgery, history of retinal detachment, sciatica on the left, and anxiety, who reports feeling tired and sleepy. She feels she has been sleepy for the past 10 years. Her sister has noted gasping sounds and mild intermittent snoring. I reviewed your office note from 01/08/2018. She denies sleep paralysis or vivid dreams, no cataplexy, no hypnagogic or hypnopompic hallucinations. She does not tend to dream during her naps, she does not nap every day. She sometimes naps for about 20 minutes. She denies any tendency to fall asleep at the wheel or standing up during conversations or at the dinner table. Her father has OSA.  She is single, lives with her sister, has no children. She is a nonsmoker and does not utilize alcohol and does not drink caffeine on a regular basis. Her Epworth sleepiness score is 12 out of 24, fatigue score is 24 out of 63. The time is around 10:30 and rise time around 6. She does not have nighttime nocturia but has had occasional morning headaches. She works at a [employer]. She has seen a cardiologist for shortness of breath, she reports that workup was benign.  Her Past Medical History Is Significant For: Past Medical History:  Diagnosis Date  . Anxiety   . Blood in stool   . Cataract   . Chicken pox   . Endometriosis of uterus 05/23/1993   LAP  . Genital warts   . Kidney disease    per pt medical screening form  . Kidney stone   . Motion  sickness    cars  . PONV (postoperative nausea and vomiting)    nausea only  . Retinal detachment   . Sciatica of left side     Her Past Surgical History Is Significant For: Past Surgical History:  Procedure Laterality Date  . CATARACT EXTRACTION W/PHACO Left 06/26/2017   Procedure: CATARACT EXTRACTION PHACO AND INTRAOCULAR LENS PLACEMENT ([place]) LEFT;  Surgeon: Leandrew Koyanagi, MD;  Location: [facility];  Service: Ophthalmology;  Laterality: Left;  . COLONOSCOPY N/A 08/25/2014   Procedure: COLONOSCOPY;  Surgeon: Manya Silvas, MD;  Location: [facility] ENDOSCOPY;  Service: Endoscopy;  Laterality: N/A;  . endoscopy    . GUM SURGERY  2008  . laproscopy      Her Family History Is Significant For: Family History  Problem Relation Age of Onset  . Colon cancer Father 77  . Arthritis Father   . Cancer Father        colon/prostate  . Hypertension Father   . Arthritis Mother   . Hyperlipidemia Mother   . Heart disease Mother   . Stroke Mother   . Hypertension Mother   . Kidney disease Mother   . Diabetes Mother        Type 1  . Congestive Heart Failure Mother   . Stroke Maternal Grandmother   . Cancer Maternal Grandfather        prostate  . Stroke Paternal Grandfather   . Breast  cancer Cousin 63    Her Social History Is Significant For: Social History   Socioeconomic History  . Marital status: Single    Spouse name: Not on file  . Number of children: Not on file  . Years of education: Not on file  . Highest education level: Not on file  Occupational History  . Not on file  Social Needs  . Financial resource strain: Not on file  . Food insecurity:    Worry: Not on file    Inability: Not on file  . Transportation needs:    Medical: Not on file    Non-medical: Not on file  Tobacco Use  . Smoking status: Never Smoker  . Smokeless tobacco: Never Used  Substance and Sexual Activity  . Alcohol use: No    Alcohol/week: 0.0 standard drinks  . Drug use: No   . Sexual activity: Never  Lifestyle  . Physical activity:    Days per week: Not on file    Minutes per session: Not on file  . Stress: Not on file  Relationships  . Social connections:    Talks on phone: Not on file    Gets together: Not on file    Attends religious service: Not on file    Active member of club or organization: Not on file    Attends meetings of clubs or organizations: Not on file    Relationship status: Not on file  Other Topics Concern  . Not on file  Social History Narrative   Lives with twin sister   Work- [employer]    No pets    No children    Right handed    No caffeine daily- tea occasionally; eats chocolate    Enjoys- shopping and eating out     Her Allergies Are:  Allergies  Allergen Reactions  . Sertraline Diarrhea  :   Her Current Medications Are:  Outpatient Encounter Medications as of 03/24/2018  Medication Sig  . Calcium Carbonate-Vitamin D (CALCIUM 600+D PO) Take by mouth daily.  Marland Kitchen escitalopram (LEXAPRO) 10 MG tablet Take 1 tablet (10 mg total) by mouth daily.  Marland Kitchen levonorgestrel-ethinyl estradiol (JOLESSA) 0.15-0.03 MG tablet Take 1 tablet by mouth daily.  Marland Kitchen spironolactone (ALDACTONE) 25 MG tablet Take 25 mg by mouth daily.  . [DISCONTINUED] amoxicillin-clavulanate (AUGMENTIN) 875-125 MG tablet Take 1 tablet by mouth 2 (two) times daily.  . [DISCONTINUED] benzonatate (TESSALON) 100 MG capsule Take 1 capsule (100 mg total) by mouth 3 (three) times daily as needed for cough.  . [DISCONTINUED] fluticasone (FLONASE) 50 MCG/ACT nasal spray Place 2 sprays into both nostrils daily.  . [DISCONTINUED] loratadine (CLARITIN) 10 MG tablet Take 1 tablet (10 mg total) by mouth daily.  . [DISCONTINUED] methylPREDNISolone (MEDROL DOSEPAK) 4 MG TBPK tablet Take according to pack instructions  . [DISCONTINUED] ondansetron (ZOFRAN ODT) 4 MG disintegrating tablet Take 2 tablets (8 mg total) by mouth every 8 (eight) hours as needed for  nausea or vomiting.  . [DISCONTINUED] Probiotic Product (PROBIOTIC COMPLEX ACIDOPHILUS) CAPS Take 1 capsule by mouth daily.   No facility-administered encounter medications on file as of 03/24/2018.   :  Review of Systems:  Out of a complete 14 point review of systems, all are reviewed and negative with the exception of these symptoms as listed below:  Review of Systems  Neurological:       Pt presents today to discuss her sleep. Pt reports that she has headaches 2-3 times per week. She  does not snore but does sleep on 2 pillows. She has never had a sleep study.  Epworth Sleepiness Scale 0= would never doze 1= slight chance of dozing 2= moderate chance of dozing 3= high chance of dozing  Sitting and reading: 2 Watching TV: 2 Sitting inactive in a public place (ex. Theater or meeting): 2 As a passenger in a car for an hour without a break: 1 Lying down to rest in the afternoon: 3 Sitting and talking to someone: 0 Sitting quietly after lunch (no alcohol): 1 In a car, while stopped in traffic: 1 Total: 12     Objective:  Neurological Exam  Physical Exam Physical Examination:   Vitals:   03/24/18 1332  BP: 104/72  Pulse: 73    General Examination: The patient is a very pleasant 48 y.o. female in no acute distress. She appears well-developed and well-nourished and well groomed.   HEENT: Normocephalic, atraumatic, pupils are equal, round and reactive to light and accommodation. Extraocular tracking is good without limitation to gaze excursion or nystagmus noted. Normal smooth pursuit is noted. Hearing is grossly intact. Face is symmetric with normal facial animation and normal facial sensation. Speech is clear with no dysarthria noted. There is no hypophonia. There is no lip, neck/head, jaw or voice tremor. Neck is supple with full range of passive and active motion. There are no carotid bruits on auscultation. Oropharynx exam reveals: mild mouth dryness, adequate dental hygiene  and no signif airway crowding, Mallampati is class I. Tongue protrudes centrally and palate elevates symmetrically. Tonsils are 1+ in size. Neck size is 13 7/8 inches.   Chest: Clear to auscultation without wheezing, rhonchi or crackles noted.  Heart: S1+S2+0, regular and normal without murmurs, rubs or gallops noted.   Abdomen: Soft, non-tender and non-distended with normal bowel sounds appreciated on auscultation.  Extremities: There is no pitting edema in the distal lower extremities bilaterally. Pedal pulses are intact.  Skin: Warm and dry without trophic changes noted.  Musculoskeletal: exam reveals no obvious joint deformities, tenderness or joint swelling or erythema.   Neurologically:  Mental status: The patient is awake, alert and oriented in all 4 spheres. Her immediate and remote memory, attention, language skills and fund of knowledge are appropriate. There is no evidence of aphasia, agnosia, apraxia or anomia. Speech is clear with normal prosody and enunciation. Thought process is linear. Mood is normal and affect is normal.  Cranial nerves II - XII are as described above under HEENT exam. In addition: shoulder shrug is normal with equal shoulder height noted. Motor exam: Normal bulk, strength and tone is noted. There is no drift, tremor or rebound. Romberg is negative. Fine motor skills and coordination: grossly intact.  Cerebellar testing: No dysmetria or intention tremor on finger to nose testing. Heel to shin is unremarkable bilaterally. There is no truncal or gait ataxia.  Sensory exam: intact to light touch in the upper and lower extremities.  Gait, station and balance: She stands easily. No veering to one side is noted. No leaning to one side is noted. Posture is age-appropriate and stance is narrow based. Gait shows normal stride length and normal pace. No problems turning are noted. Tandem walk is challenging.   Assessment and Plan:  In summary, Eleny Cortez is a very  pleasant 48 y.o.-year old female with an underlying medical history of cataracts with status post surgery, history of retinal detachment, sciatica on the left, and anxiety, who presents for evaluation of her sleepiness.  I had  a long chat with the patient and her sister about my findings and the diagnosis of OSA and sleepiness disorders, such as narcolepsy, although her history is not very telltale for narcolepsy or OSA even. Nevertheless, we will proceed with extended sleep study testing in the form of nocturnal polysomnogram followed by a next a nap study. An order for the sleep study to be conducted correctly she would have to be tapered off the Lexapro. She is encouraged to talk to you about it. She would have to be off of it altogether for at least 2 weeks. She is willing to consider this. She had blood work in November 2019 and I reviewed the results. Talked about sleep apnea treatments as well. She is familiar with CPAP therapy. I explained the sleep test procedure to the patient and her sister.  I plan to see her back after testing is completed. I answered all their questions today and the patient and her sister were in agreement. Thank you very much for allowing me to participate in the care of this nice patient. If I can be of any further assistance to you please do not hesitate to call me at [phone].  Sincerely,   Star Age, MD, PhD

## 2018-04-09 ENCOUNTER — Ambulatory Visit: Payer: BC Managed Care – PPO | Admitting: Family

## 2018-04-09 VITALS — BP 122/80 | HR 63 | Temp 97.6°F | Resp 16 | Ht 69.0 in | Wt 156.8 lb

## 2018-04-09 DIAGNOSIS — M7989 Other specified soft tissue disorders: Secondary | ICD-10-CM

## 2018-04-09 NOTE — Patient Instructions (Addendum)
Suspect venous insufficiency , leg swelling may be source of pain  Continue to follow low-salt diet, compression stockings, elevate legs.  Wean off the lexapro Start taking Lexapro 10 mg every other day for 1 week, the following week may take Lexapro 10 mg every third day.  Then You may stop.  You would resume Lexapro after your sleep study.  Today we discussed referrals, orders. vascular   I have placed these orders in the system for you.  Please be sure to give [place] a call if you have not heard from our office regarding this. We should hear from [place] within ONE week with information regarding your appointment. If not, please let me know immediately.

## 2018-04-09 NOTE — Progress Notes (Signed)
Subjective:    Patient ID: Margaret Mcfarland, female    DOB: 10-20-1970, 48 y.o.   MRN: [id number]  CC: Margaret Mcfarland is a 48 y.o. female who presents today for an acute visit.    HPI: CC: bilateral pain in anterior and posterior lower legs, couple of weeks. Worsening.   Had swelling in bilateral legs, resolved today. No knee pain. No falls. No endurance sports such as running.   LE swelling usually resolves with spiraonlactone and compression stockings.  Sister is present today.   Pain in legs is Worse with standing for long periods. No worse with walking.  No SOB, coughing, orthopnea.  No h/o chf.   Needs to come off lexapro by 04/15/2018 for sleep study, then to resume.    Dr Nehemiah Massed- suspected non cardiac angina.   HISTORY:  Past Medical History:  Diagnosis Date  . Anxiety   . Blood in stool   . Cataract   . Chicken pox   . Endometriosis of uterus 05/23/1993   LAP  . Genital warts   . Kidney disease    per pt medical screening form  . Kidney stone   . Motion sickness    cars  . PONV (postoperative nausea and vomiting)    nausea only  . Retinal detachment   . Sciatica of left side    Past Surgical History:  Procedure Laterality Date  . CATARACT EXTRACTION W/PHACO Left 06/26/2017   Procedure: CATARACT EXTRACTION PHACO AND INTRAOCULAR LENS PLACEMENT ([place]) LEFT;  Surgeon: Leandrew Koyanagi, MD;  Location: [facility];  Service: Ophthalmology;  Laterality: Left;  . COLONOSCOPY N/A 08/25/2014   Procedure: COLONOSCOPY;  Surgeon: Manya Silvas, MD;  Location: [facility] ENDOSCOPY;  Service: Endoscopy;  Laterality: N/A;  . endoscopy    . GUM SURGERY  2008  . laproscopy     Family History  Problem Relation Age of Onset  . Colon cancer Father 52  . Arthritis Father   . Cancer Father        colon/prostate  . Hypertension Father   . Arthritis Mother   . Hyperlipidemia Mother   . Heart disease Mother   . Stroke Mother   . Hypertension Mother   . Kidney disease  Mother   . Diabetes Mother        Type 1  . Congestive Heart Failure Mother   . Stroke Maternal Grandmother   . Cancer Maternal Grandfather        prostate  . Stroke Paternal Grandfather   . Breast cancer Cousin 35    Allergies: Sertraline Current Outpatient Medications on File Prior to Visit  Medication Sig Dispense Refill  . Calcium Carbonate-Vitamin D (CALCIUM 600+D PO) Take by mouth daily.    Marland Kitchen escitalopram (LEXAPRO) 10 MG tablet Take 1 tablet (10 mg total) by mouth daily. 90 tablet 1  . levonorgestrel-ethinyl estradiol (JOLESSA) 0.15-0.03 MG tablet Take 1 tablet by mouth daily. 91 tablet 3  . spironolactone (ALDACTONE) 25 MG tablet Take 25 mg by mouth daily.     No current facility-administered medications on file prior to visit.     Social History   Tobacco Use  . Smoking status: Never Smoker  . Smokeless tobacco: Never Used  Substance Use Topics  . Alcohol use: No    Alcohol/week: 0.0 standard drinks  . Drug use: No    Review of Systems  Constitutional: Negative for chills and fever.  Respiratory: Negative for cough, shortness of breath and wheezing.  Cardiovascular: Positive for leg swelling. Negative for chest pain and palpitations.  Gastrointestinal: Negative for nausea and vomiting.  Musculoskeletal: Positive for arthralgias. Negative for joint swelling.      Objective:    BP 122/80   Pulse 63   Temp 97.6 F (36.4 C) (Oral)   Resp 16   Ht 5\' 9"  (1.753 m)   Wt 156 lb 12.8 oz (71.1 kg)   SpO2 98%   BMI 23.16 kg/m  Wt Readings from Last 3 Encounters:  04/09/18 156 lb 12.8 oz (71.1 kg)  03/24/18 153 lb (69.4 kg)  03/01/18 151 lb (68.5 kg)     Physical Exam Vitals signs reviewed.  Constitutional:      Appearance: She is well-developed.  Eyes:     Conjunctiva/sclera: Conjunctivae normal.  Cardiovascular:     Rate and Rhythm: Normal rate and regular rhythm.     Pulses: Normal pulses.     Heart sounds: Normal heart sounds.     Comments: No LE  edema, palpable cords or masses. No erythema or increased warmth. No asymmetry in calf size when compared bilaterally LE hair growth symmetric and present. No discoloration or varicosities noted. LE warm and palpable pedal pulses.  Pulmonary:     Effort: Pulmonary effort is normal.     Breath sounds: Normal breath sounds. No wheezing, rhonchi or rales.  Musculoskeletal:     Right knee: She exhibits normal range of motion, no swelling and no erythema. No tenderness found.     Left knee: She exhibits normal range of motion and no swelling. No tenderness found.     Right ankle: She exhibits normal range of motion and no swelling. No tenderness.     Left ankle: She exhibits normal range of motion and no swelling. No tenderness.     Right lower leg: No edema.     Left lower leg: No edema.     Right foot: No tenderness.     Left foot: No tenderness.     Comments: Diffuse tenderness with palpation of BL anterior shins. No pain posterior shins, knees ,bilaterally.     Skin:    General: Skin is warm and dry.  Neurological:     Mental Status: She is alert.  Psychiatric:        Speech: Speech normal.        Behavior: Behavior normal.        Thought Content: Thought content normal.        Assessment & Plan:   Problem List Items Addressed This Visit      Other   Leg swelling - Primary    No edema appreciated today. Leg pain remains nonspecific. Patient is not participating in high endurance exercise to raise suspicion for overuse such as medial tibial stress syndrome. She also doesn't appear to have claudication.  Based on h/o of edema which is appears adequately controlled with medication, compression stockings, we agreed consult with vascular appropriate, possibly orthopedics if no resolve. Will follow.       Relevant Orders   Ambulatory referral to Vascular Surgery        I am having Margaret Mcfarland maintain her Calcium Carbonate-Vitamin D (CALCIUM 600+D PO), spironolactone,  escitalopram, and levonorgestrel-ethinyl estradiol.   No orders of the defined types were placed in this encounter.   Return precautions given.   Risks, benefits, and alternatives of the medications and treatment plan prescribed today were discussed, and patient expressed understanding.   Education regarding symptom management and  diagnosis given to patient on AVS.  Continue to follow with Burnard Hawthorne, FNP for routine health maintenance.   Godfrey Pick and I agreed with plan.   Mable Paris, FNP

## 2018-04-11 DIAGNOSIS — M7989 Other specified soft tissue disorders: Secondary | ICD-10-CM | POA: Insufficient documentation

## 2018-04-11 NOTE — Assessment & Plan Note (Signed)
No edema appreciated today. Leg pain remains nonspecific. Patient is not participating in high endurance exercise to raise suspicion for overuse such as medial tibial stress syndrome. She also doesn't appear to have claudication.  Based on h/o of edema which is appears adequately controlled with medication, compression stockings, we agreed consult with vascular appropriate, possibly orthopedics if no resolve. Will follow.

## 2018-04-17 ENCOUNTER — Encounter (INDEPENDENT_AMBULATORY_CARE_PROVIDER_SITE_OTHER): Payer: Self-pay | Admitting: Vascular Surgery

## 2018-04-17 ENCOUNTER — Ambulatory Visit (INDEPENDENT_AMBULATORY_CARE_PROVIDER_SITE_OTHER): Payer: BC Managed Care – PPO | Admitting: Vascular Surgery

## 2018-04-17 VITALS — BP 127/77 | HR 71 | Resp 16 | Ht 69.0 in | Wt 157.4 lb

## 2018-04-17 DIAGNOSIS — R600 Localized edema: Secondary | ICD-10-CM

## 2018-04-17 DIAGNOSIS — I890 Lymphedema, not elsewhere classified: Secondary | ICD-10-CM

## 2018-04-17 DIAGNOSIS — M79606 Pain in leg, unspecified: Secondary | ICD-10-CM | POA: Diagnosis not present

## 2018-04-17 DIAGNOSIS — M7989 Other specified soft tissue disorders: Secondary | ICD-10-CM

## 2018-04-17 NOTE — Progress Notes (Signed)
MRN : [id number]  Margaret Mcfarland is a 48 y.o. (November 12, 1970) female who presents with chief complaint of  Chief Complaint  Patient presents with  . New Patient (Initial Visit)  .  History of Present Illness:   Patient is seen for evaluation of leg pain and leg swelling. The patient first noticed the swelling remotely. The swelling is associated with pain and discoloration. The pain and swelling worsens with prolonged dependency and improves with elevation. The pain is unrelated to activity.  The patient notes that in the morning the legs are significantly improved but they steadily worsened throughout the course of the day. The patient also notes a steady worsening of the discoloration in the ankle and shin area.   The patient denies claudication symptoms.  The patient denies symptoms consistent with rest pain.  The patient denies and extensive history of DJD and LS spine disease.  The patient has no had any past angiography, interventions or vascular surgery.  Elevation makes the leg symptoms better, dependency makes them much worse. There is no history of ulcerations. The patient denies any recent changes in medications.  The patient has not been wearing graduated compression.  The patient denies a history of DVT or PE. There is no prior history of phlebitis. There is no history of primary lymphedema.  No history of malignancies. No history of trauma or groin or pelvic surgery. There is no history of radiation treatment to the groin or pelvis  The patient denies amaurosis fugax or recent TIA symptoms. There are no recent neurological changes noted. The patient denies recent episodes of angina or shortness of breath  Current Meds  Medication Sig  . Calcium Carbonate-Vitamin D (CALCIUM 600+D PO) Take by mouth daily.  Marland Kitchen escitalopram (LEXAPRO) 10 MG tablet Take 1 tablet (10 mg total) by mouth daily.  Marland Kitchen levonorgestrel-ethinyl estradiol (JOLESSA) 0.15-0.03 MG tablet Take 1 tablet by mouth  daily.  Marland Kitchen spironolactone (ALDACTONE) 25 MG tablet Take 25 mg by mouth daily.    Past Medical History:  Diagnosis Date  . Anxiety   . Blood in stool   . Cataract   . Chicken pox   . Endometriosis of uterus 05/23/1993   LAP  . Genital warts   . Kidney disease    per pt medical screening form  . Kidney stone   . Motion sickness    cars  . PONV (postoperative nausea and vomiting)    nausea only  . Retinal detachment   . Sciatica of left side     Past Surgical History:  Procedure Laterality Date  . CATARACT EXTRACTION W/PHACO Left 06/26/2017   Procedure: CATARACT EXTRACTION PHACO AND INTRAOCULAR LENS PLACEMENT ([place]) LEFT;  Surgeon: Leandrew Koyanagi, MD;  Location: [facility];  Service: Ophthalmology;  Laterality: Left;  . COLONOSCOPY N/A 08/25/2014   Procedure: COLONOSCOPY;  Surgeon: Manya Silvas, MD;  Location: [facility] ENDOSCOPY;  Service: Endoscopy;  Laterality: N/A;  . endoscopy    . GUM SURGERY  2008  . laproscopy      Social History Social History   Tobacco Use  . Smoking status: Never Smoker  . Smokeless tobacco: Never Used  Substance Use Topics  . Alcohol use: No    Alcohol/week: 0.0 standard drinks  . Drug use: No    Family History Family History  Problem Relation Age of Onset  . Colon cancer Father 58  . Arthritis Father   . Cancer Father        colon/prostate  .  Hypertension Father   . Arthritis Mother   . Hyperlipidemia Mother   . Heart disease Mother   . Stroke Mother   . Hypertension Mother   . Kidney disease Mother   . Diabetes Mother        Type 1  . Congestive Heart Failure Mother   . Stroke Maternal Grandmother   . Cancer Maternal Grandfather        prostate  . Stroke Paternal Grandfather   . Breast cancer Cousin 35  No family history of bleeding/clotting disorders, porphyria or autoimmune disease   Allergies  Allergen Reactions  . Sertraline Diarrhea     REVIEW OF SYSTEMS (Negative unless  checked)  Constitutional: [] Weight loss  [] Fever  [] Chills Cardiac: [] Chest pain   [] Chest pressure   [] Palpitations   [] Shortness of breath when laying flat   [] Shortness of breath with exertion. Vascular:  [] Pain in legs with walking   [x] Pain in legs with standing  [] History of DVT   [] Phlebitis   [x] Swelling in legs   [] Varicose veins   [] Non-healing ulcers Pulmonary:   [] Uses home oxygen   [] Productive cough   [] Hemoptysis   [] Wheeze  [] COPD   [] Asthma Neurologic:  [] Dizziness   [] Seizures   [] History of stroke   [] History of TIA  [] Aphasia   [] Vissual changes   [] Weakness or numbness in arm   [] Weakness or numbness in leg Musculoskeletal:   [] Joint swelling   [] Joint pain   [] Low back pain Hematologic:  [] Easy bruising  [] Easy bleeding   [] Hypercoagulable state   [] Anemic Gastrointestinal:  [] Diarrhea   [] Vomiting  [] Gastroesophageal reflux/heartburn   [] Difficulty swallowing. Genitourinary:  [] Chronic kidney disease   [] Difficult urination  [] Frequent urination   [] Blood in urine Skin:  [] Rashes   [] Ulcers  Psychological:  [] History of anxiety   []  History of major depression.  Physical Examination  Vitals:   04/17/18 1450  BP: 127/77  Pulse: 71  Resp: 16  Weight: 157 lb 6.4 oz (71.4 kg)  Height: 5\' 9"  (1.753 m)   Body mass index is 23.24 kg/m. Gen: WD/WN, NAD Head: [place]/AT, No temporalis wasting.  Ear/Nose/Throat: Hearing grossly intact, nares w/o erythema or drainage, poor dentition Eyes: PER, EOMI, sclera nonicteric.  Neck: Supple, no masses.  No bruit or JVD.  Pulmonary:  Good air movement, clear to auscultation bilaterally, no use of accessory muscles.  Cardiac: RRR, normal S1, S2, no Murmurs. Vascular: scattered varicosities present bilaterally.  Mild to moderate venous stasis changes to the legs bilaterally.  3+ soft pitting edema Vessel Right Left  Radial Palpable Palpable  PT Palpable Palpable  DP Palpable Palpable  Gastrointestinal: soft, non-distended. No  guarding/no peritoneal signs.  Musculoskeletal: M/S 5/5 throughout.  No deformity or atrophy.  Neurologic: CN 2-12 intact. Pain and light touch intact in extremities.  Symmetrical.  Speech is fluent. Motor exam as listed above. Psychiatric: Judgment intact, Mood & affect appropriate for pt's clinical situation. Dermatologic: venous rashes no ulcers noted.  No changes consistent with cellulitis. Lymph : No Cervical lymphadenopathy, no lichenification or skin changes of chronic lymphedema.  CBC Lab Results  Component Value Date   WBC 6.3 01/08/2018   HGB 14.0 01/08/2018   HCT 42.1 01/08/2018   MCV 91.3 01/08/2018   PLT 252.0 01/08/2018    BMET    Component Value Date/Time   NA 136 01/08/2018 1546   K 4.3 01/08/2018 1546   CL 103 01/08/2018 1546   CO2 28 01/08/2018 1546  GLUCOSE 83 01/08/2018 1546   BUN 14 01/08/2018 1546   CREATININE 0.75 01/08/2018 1546   CREATININE 0.73 07/01/2015 1527   CALCIUM 9.4 01/08/2018 1546   GFRNONAA >60 05/30/2017 1840   GFRAA >60 05/30/2017 1840   CrCl cannot be calculated (Patient's most recent lab result is older than the maximum 21 days allowed.).  COAG Lab Results  Component Value Date   INR 1.05 07/01/2015    Radiology No results found.    Assessment/Plan 1. Leg swelling I have had a long discussion with the patient regarding swelling and why it  causes symptoms.  Patient will begin wearing graduated compression stockings class 1 (20-30 mmHg) on a daily basis a prescription was given. The patient will  beginning wearing the stockings first thing in the morning and removing them in the evening. The patient is instructed specifically not to sleep in the stockings.   In addition, behavioral modification will be initiated.  This will include frequent elevation, use of over the counter pain medications and exercise such as walking.  I have reviewed systemic causes for chronic edema such as liver, kidney and cardiac etiologies.  The  patient denies problems with these organ systems.    Consideration for a lymph pump will also be made based upon the effectiveness of conservative therapy.  This would help to improve the edema control and prevent sequela such as ulcers and infections   Patient should undergo duplex ultrasound of the venous system to ensure that DVT or reflux is not present.  The patient will follow-up with me after the ultrasound.    2. Lymphedema I have had a long discussion with the patient regarding swelling and why it  causes symptoms.  Patient will begin wearing graduated compression stockings class 1 (20-30 mmHg) on a daily basis a prescription was given. The patient will  beginning wearing the stockings first thing in the morning and removing them in the evening. The patient is instructed specifically not to sleep in the stockings.   In addition, behavioral modification will be initiated.  This will include frequent elevation, use of over the counter pain medications and exercise such as walking.  I have reviewed systemic causes for chronic edema such as liver, kidney and cardiac etiologies.  The patient denies problems with these organ systems.    Consideration for a lymph pump will also be made based upon the effectiveness of conservative therapy.  This would help to improve the edema control and prevent sequela such as ulcers and infections   Patient should undergo duplex ultrasound of the venous system to ensure that DVT or reflux is not present.  The patient will follow-up with me after the ultrasound.    3. Pain of lower extremity, unspecified laterality I have had a long discussion with the patient regarding swelling and why it  causes symptoms.  Patient will begin wearing graduated compression stockings class 1 (20-30 mmHg) on a daily basis a prescription was given. The patient will  beginning wearing the stockings first thing in the morning and removing them in the evening. The patient is  instructed specifically not to sleep in the stockings.   In addition, behavioral modification will be initiated.  This will include frequent elevation, use of over the counter pain medications and exercise such as walking.  I have reviewed systemic causes for chronic edema such as liver, kidney and cardiac etiologies.  The patient denies problems with these organ systems.    Consideration for a lymph pump  will also be made based upon the effectiveness of conservative therapy.  This would help to improve the edema control and prevent sequela such as ulcers and infections   Patient should undergo duplex ultrasound of the venous system to ensure that DVT or reflux is not present.  The patient will follow-up with me after the ultrasound.      Hortencia Pilar, MD  04/17/2018 2:59 PM

## 2018-04-20 ENCOUNTER — Encounter (INDEPENDENT_AMBULATORY_CARE_PROVIDER_SITE_OTHER): Payer: Self-pay | Admitting: Vascular Surgery

## 2018-04-20 IMAGING — CT CT ABD-PELV W/ CM
2 of 5 series · 15 of 46 positions shown, 17 images · IV contrast (omnipaque)
Comparison: None.

CLINICAL DATA: Intermediate mid abdominal pain with black tarry
stools for 6 weeks. Nausea.

EXAM:
CT ABDOMEN AND PELVIS WITH CONTRAST
TECHNIQUE: Multidetector CT imaging of the abdomen and pelvis was performed
using the standard protocol following bolus administration of
intravenous contrast.
CONTRAST:  100mL OMNIPAQUE IOHEXOL 300 MG/ML  SOLN

[Series 2: abd pelvis · axial · 0.65mm/px · z∈[-1602,-1197]mm · 12 of 97 slices shown, 14 images (1 of 2)]
[im 8/97  soft-tissue]
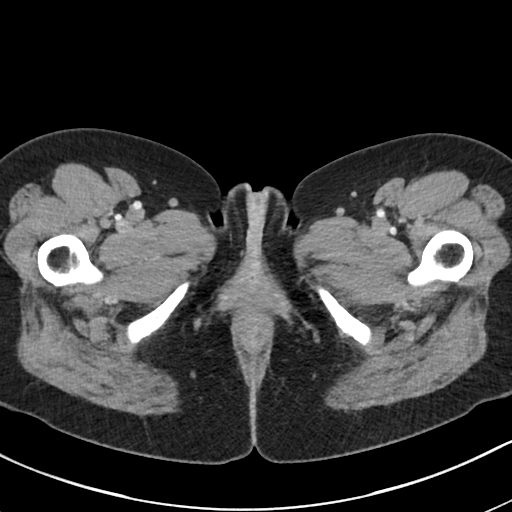
[im 8/97  bone]
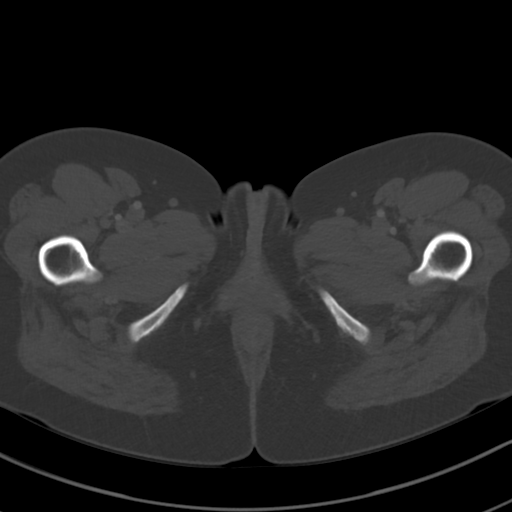
[im 15/97  soft-tissue]
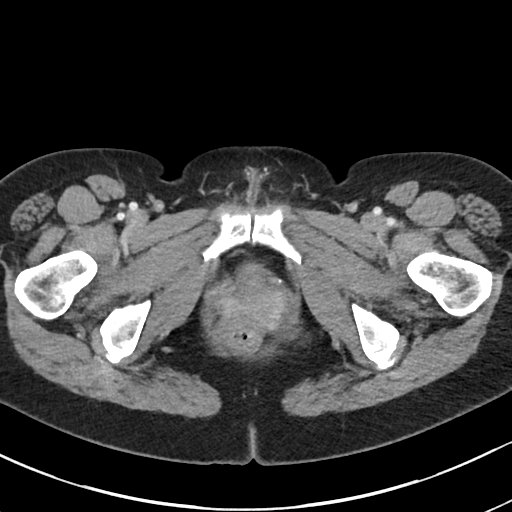
[im 23/97  soft-tissue]
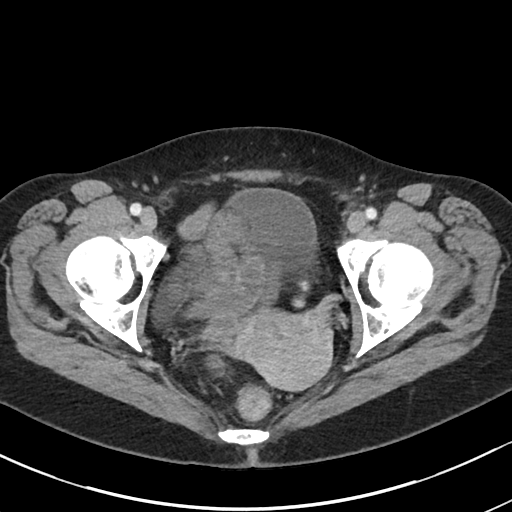
[im 30/97  soft-tissue]
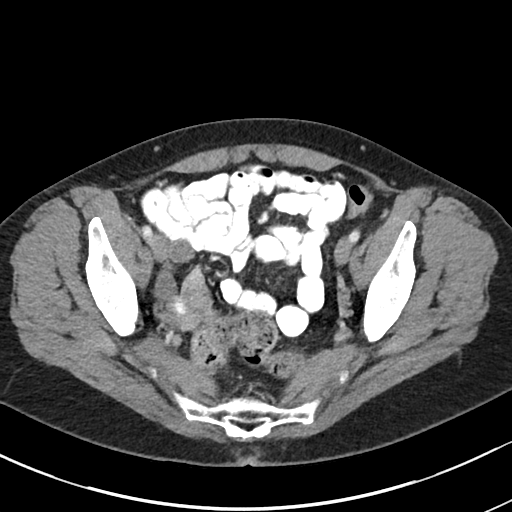
[im 37/97  soft-tissue]
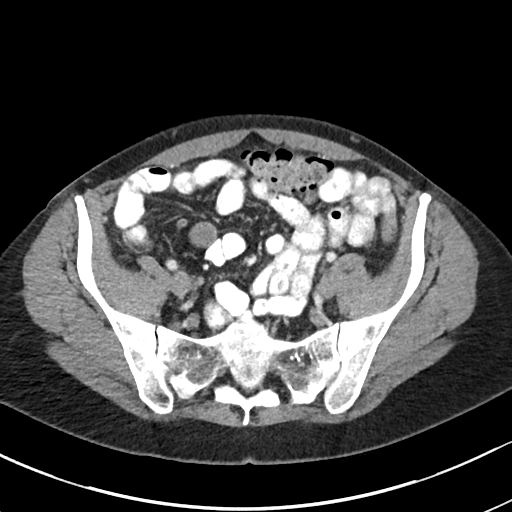
[im 45/97  soft-tissue]
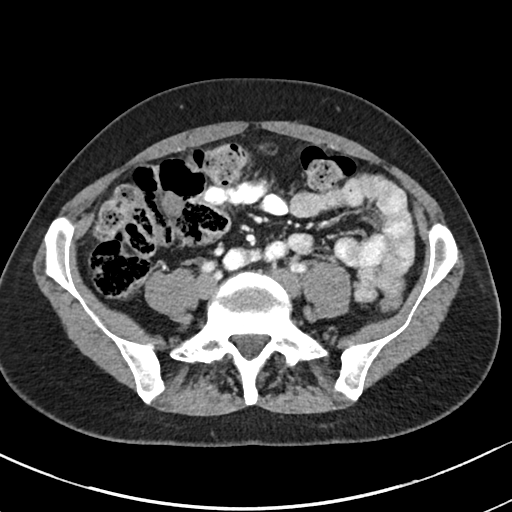
[im 52/97  soft-tissue]
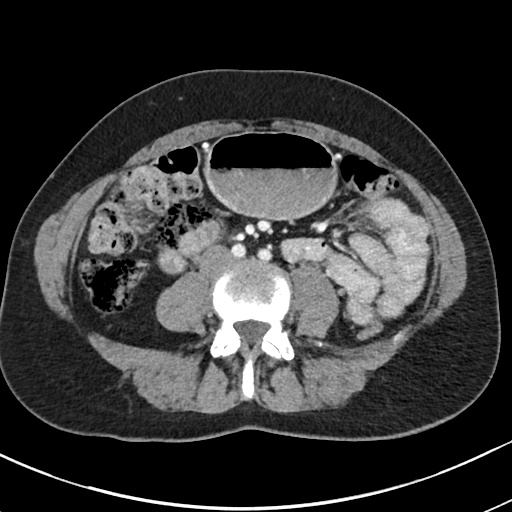
[im 60/97  soft-tissue]
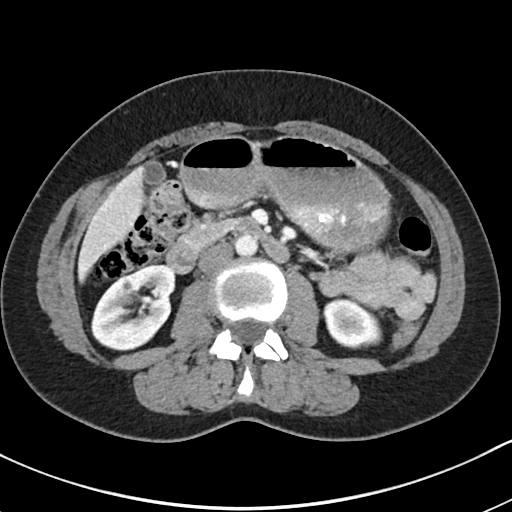
[im 67/97  soft-tissue]
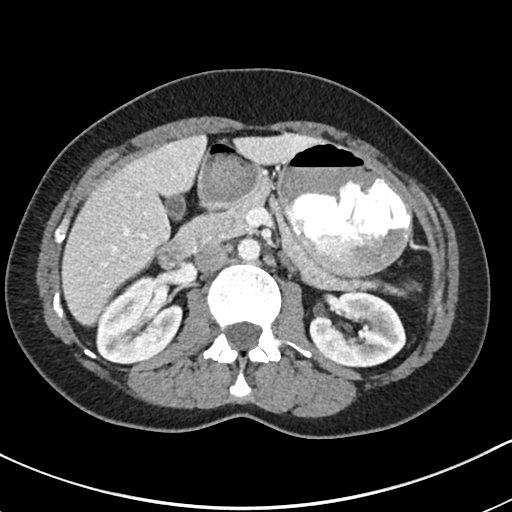
[im 67/97  bone]
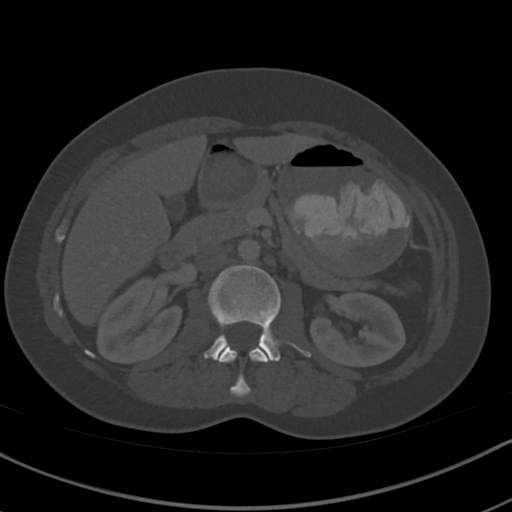
[im 74/97  soft-tissue]
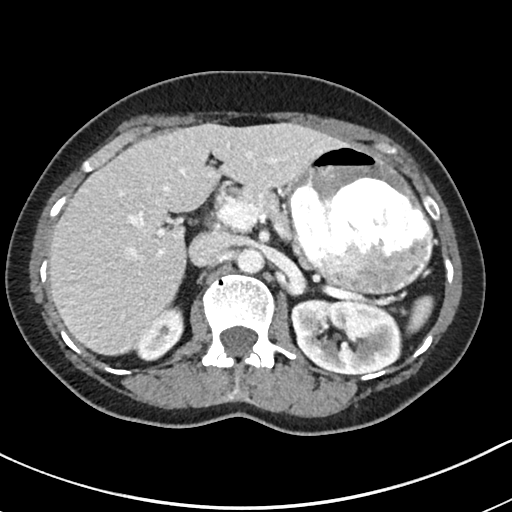
[im 82/97  soft-tissue]
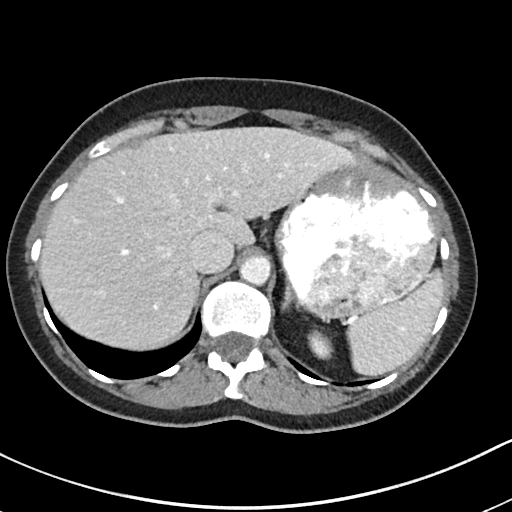
[im 89/97  soft-tissue]
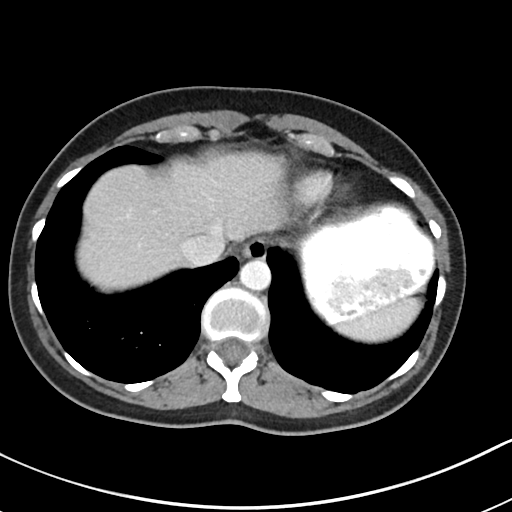

[Series 4: abd pelvis · coronal · 0.65mm/px · 3 of 130 slices shown (2 of 2)]
[im 44/130  soft-tissue]
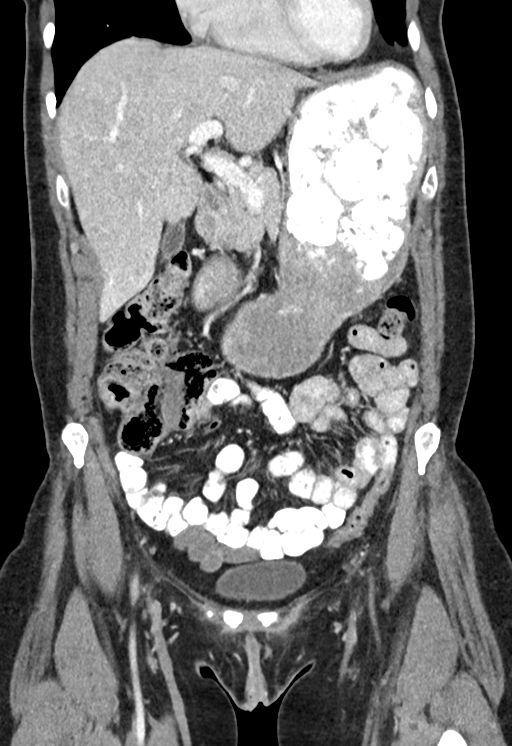
[im 58/130  soft-tissue]
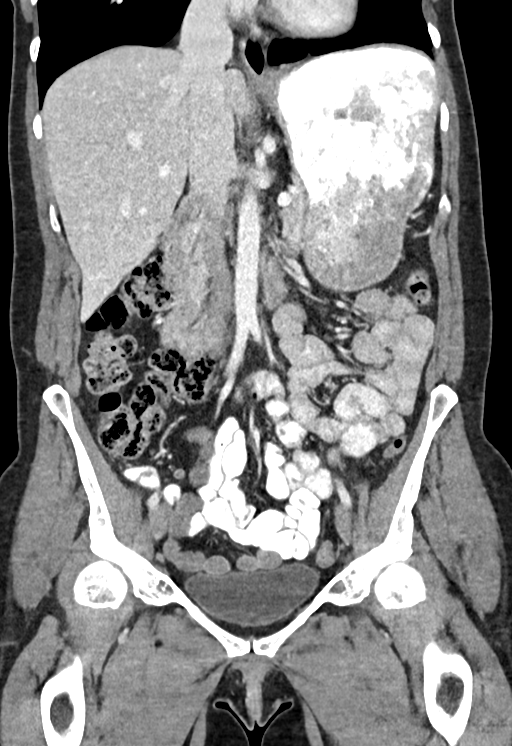
[im 72/130  soft-tissue]
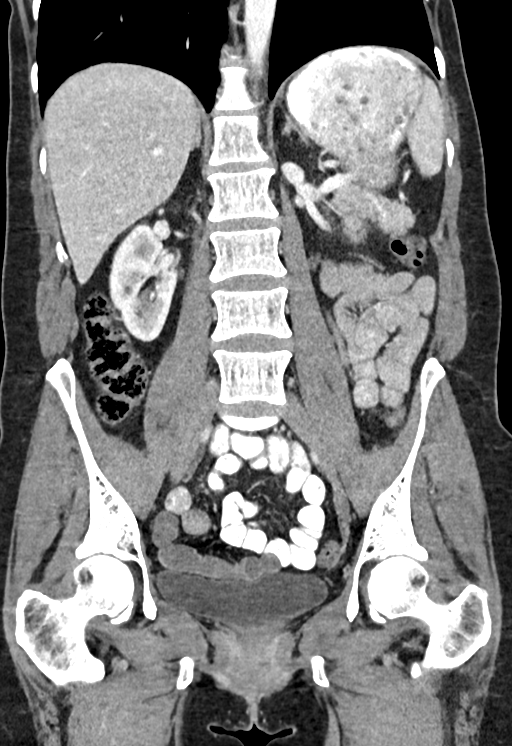

[15 of 46 positions shown; findings below may reference images not displayed]

FINDINGS: Lower chest: Unremarkable

Hepatobiliary: Contracted gallbladder, otherwise unremarkable.

Pancreas: Unremarkable

Spleen: Unremarkable

Adrenals/Urinary Tract: Small hypodense renal lesions are
statistically likely to be cysts but technically too small to
characterize. 2 mm left kidney lower pole nonobstructive renal
calculus, image 81/4.

Stomach/Bowel: Unremarkable.  Appendix normal.

Vascular/Lymphatic: Unremarkable

Reproductive: Retroflexed uterus.  Otherwise unremarkable.

Other: No supplemental non-categorized findings.

Musculoskeletal: Unremarkable
IMPRESSION: 1. A cause for the patient's abdominal discomfort and GI bleeding is
not identified.
2. 2 mm left kidney lower pole nonobstructive renal calculus.

## 2018-04-21 ENCOUNTER — Ambulatory Visit: Payer: BC Managed Care – PPO | Admitting: Family

## 2018-05-01 ENCOUNTER — Ambulatory Visit (INDEPENDENT_AMBULATORY_CARE_PROVIDER_SITE_OTHER): Payer: BC Managed Care – PPO

## 2018-05-01 ENCOUNTER — Ambulatory Visit: Payer: BC Managed Care – PPO | Admitting: Family Medicine

## 2018-05-01 VITALS — BP 120/82 | HR 64 | Temp 98.3°F | Resp 16 | Ht 69.0 in | Wt 157.8 lb

## 2018-05-01 DIAGNOSIS — M25512 Pain in left shoulder: Secondary | ICD-10-CM

## 2018-05-01 DIAGNOSIS — M79672 Pain in left foot: Secondary | ICD-10-CM

## 2018-05-01 NOTE — Progress Notes (Signed)
Subjective:    Patient ID: Margaret Mcfarland, female    DOB: [birth year], 48 y.o.   MRN: [id number]  HPI   Patient presents to clinic complaining of pain in the left arm that has been present for about a month.  States she notices pain especially when reaching arm backward to put on her jacket.  Wonders if it is arthritis.  Denies any injury to left arm/shoulder.  Patient also reports pain on top of left foot.  Patient states a food cart rolled over top of left foot about a week ago.  States the pain in the top of the foot comes and goes, notices it more when she is standing for a longer period of time.  Patient Active Problem List   Diagnosis Date Noted  . Lymphedema 04/17/2018  . Leg pain 04/17/2018  . Leg swelling 04/11/2018  . Allergic rhinitis 03/01/2018  . Other fatigue 01/08/2018  . Acute sinusitis 05/01/2017  . Headache 04/24/2017  . Weight gain 04/24/2017  . Encounter for medical examination to establish care 10/16/2016  . Chronic left-sided low back pain without sciatica 10/16/2016  . Chest pain 10/16/2016  . Anxiety 07/18/2016  . Poison ivy dermatitis 07/18/2016  . Missed period 07/18/2016  . [place] ([place]) 07/06/2015  . B12 deficiency 08/16/2014   Social History   Tobacco Use  . Smoking status: Never Smoker  . Smokeless tobacco: Never Used  Substance Use Topics  . Alcohol use: No    Alcohol/week: 0.0 standard drinks    Review of Systems   Constitutional: Negative for chills, fatigue and fever.  HENT: Negative for congestion, ear pain, sinus pain and sore throat.   Eyes: Negative.   Respiratory: Negative for cough, shortness of breath and wheezing.   Cardiovascular: Negative for chest pain, palpitations and leg swelling.  Gastrointestinal: Negative for abdominal pain, diarrhea, nausea and vomiting.  Genitourinary: Negative for dysuria, frequency and urgency.  Musculoskeletal: +left arm/shoulder pain, +left foot pain Skin: Negative for color change, pallor and  rash.  Neurological: Negative for syncope, light-headedness and headaches.  Psychiatric/Behavioral: The patient is not nervous/anxious.       Objective:   Physical Exam Vitals signs and nursing note reviewed.  Constitutional:      General: She is not in acute distress. HENT:     Head: Normocephalic and atraumatic.  Cardiovascular:     Rate and Rhythm: Normal rate and regular rhythm.     Pulses: Normal pulses.  Pulmonary:     Effort: Pulmonary effort is normal. No respiratory distress.     Breath sounds: Normal breath sounds.  Musculoskeletal:        General: No deformity.     Left shoulder: She exhibits bony tenderness (pain when reaching backward. grips equal and strong. can reach across chest to grab right shoulder. ROM of elbow, hand, wrist.).     Left ankle: Tenderness (Area of tenderness represented by blue circle on diagram.  Range of motion of ankle and toes intact.  No swelling.  No bruising seen.).       Feet:  Skin:    General: Skin is warm and dry.     Coloration: Skin is not pale.     Findings: No bruising or erythema.  Neurological:     Mental Status: She is alert and oriented to person, place, and time.     Gait: Gait normal.  Psychiatric:        Mood and Affect: Mood normal.  Behavior: Behavior normal.    Vitals:   05/01/18 1455  BP: 120/82  Pulse: 64  Resp: 16  Temp: 98.3 F (36.8 C)  SpO2: 100%      Assessment & Plan:    Left shoulder pain - we will get x-ray of left shoulder to further investigate.  Suspect pain is related to arthritis.  Advised patient to use Aleve to 220 mg twice daily to help reduce pain and also do gentle stretching and range of motion exercises.  Left foot pain -- We will do x-ray of left foot to further investigate.  Advised to wear sneaker type shoe that she can tie snugly with laces to keep foot in a comfortable position.  Also advised that the Aleve she will take to help her shoulder will help with foot pain as  well.  Patient will be contacted in regards to x-ray results.  Advised to keep regularly scheduled follow-up as planned and return to clinic sooner if any issues arise.

## 2018-05-07 ENCOUNTER — Telehealth: Payer: Self-pay

## 2018-05-07 DIAGNOSIS — M25512 Pain in left shoulder: Secondary | ICD-10-CM

## 2018-05-07 NOTE — Telephone Encounter (Signed)
Copied from [place] [id number]. Topic: General - Other >> May 07, 2018  4:08 PM Wynetta Emery, [place] C wrote: Reason for CRM: pt says that she had a x-ray and the results showed no concerns, patient said that she is still having arm pain. Pt would like to know if tendonitis or bursitis would show in x-ray? Pt would like to know if PCP thinks that an injection would help with her current pain?  [phone]

## 2018-05-09 NOTE — Telephone Encounter (Signed)
Patient notified & will call our office if she does not hear in regards to referral.

## 2018-05-09 NOTE — Telephone Encounter (Signed)
Call patient I reviewed her note when she was seen my colleague [name].  I think it would be reasonable to see an orthopedic if she continues to experience pain on conservative therapy I have placed referral.  Please advise her to call the office if not hear from [place].  Orthopedics are able to place injections

## 2018-05-09 NOTE — Addendum Note (Signed)
Addended by: Burnard Hawthorne on: 05/09/2018 02:50 PM   Modules accepted: Orders

## 2018-05-13 ENCOUNTER — Telehealth (INDEPENDENT_AMBULATORY_CARE_PROVIDER_SITE_OTHER): Payer: Self-pay | Admitting: Vascular Surgery

## 2018-05-13 NOTE — Telephone Encounter (Signed)
Patient stated that she is wearing the compression that comes up to the knee and she is having pain behind the knee

## 2018-05-14 ENCOUNTER — Telehealth (INDEPENDENT_AMBULATORY_CARE_PROVIDER_SITE_OTHER): Payer: Self-pay | Admitting: Vascular Surgery

## 2018-05-14 NOTE — Telephone Encounter (Signed)
I informed patient with Margaret Mcfarland (NP) medical advice and recommended her to go to [facility] to have her legs properly measured and she stated she will call the office if she continues to have leg pain

## 2018-05-14 NOTE — Telephone Encounter (Signed)
Left a message on patient voicemail to return a call back to the office 

## 2018-05-14 NOTE — Telephone Encounter (Signed)
There are numerous types of compression socks and in some instances they can be very tight near top causing discomfort. Sometimes different brands are more comfortable than others, and ensuring that your leg has been properly measured will also help with some of these symptoms.  Some of the medical supply locations will measure you, or if you can get a friend or family member to measure the largest part of your calf with a tape measure.    Also be sure that you are elevating your legs whenever possible .

## 2018-05-14 NOTE — Telephone Encounter (Signed)
A message was left for the patient to call back. Margaret Mcfarland has spoken with the patient and advised that she could go to [name] for a fitting.

## 2018-05-19 ENCOUNTER — Telehealth (INDEPENDENT_AMBULATORY_CARE_PROVIDER_SITE_OTHER): Payer: Self-pay

## 2018-05-19 ENCOUNTER — Telehealth: Payer: Self-pay | Admitting: Family

## 2018-05-19 NOTE — Telephone Encounter (Signed)
Pt. Reports she has a sleep study coming up and they stopped her Lexapro. Will call them and see how to restart after her sleep study.

## 2018-05-19 NOTE — Telephone Encounter (Signed)
Patient left a message requesting a prescription for compression stoockings to faxed over to [facility] for insurance purpose and I left a message on patient voicemail informing that prescription has been faxed

## 2018-05-20 ENCOUNTER — Telehealth (INDEPENDENT_AMBULATORY_CARE_PROVIDER_SITE_OTHER): Payer: Self-pay | Admitting: Vascular Surgery

## 2018-05-20 NOTE — Telephone Encounter (Signed)
Ted hose or compression stockings...panty hose don't provide support

## 2018-05-20 NOTE — Telephone Encounter (Signed)
Patient wants to know if she needs ted hose or panty hose. The back of her leg is hurting near her knee. Please advise. AS, CMA

## 2018-05-21 ENCOUNTER — Other Ambulatory Visit: Payer: Self-pay

## 2018-05-21 ENCOUNTER — Ambulatory Visit (INDEPENDENT_AMBULATORY_CARE_PROVIDER_SITE_OTHER): Payer: BC Managed Care – PPO | Admitting: Neurology

## 2018-05-21 DIAGNOSIS — G4710 Hypersomnia, unspecified: Secondary | ICD-10-CM | POA: Diagnosis not present

## 2018-05-21 DIAGNOSIS — R400 Somnolence: Secondary | ICD-10-CM

## 2018-05-21 DIAGNOSIS — R51 Headache: Secondary | ICD-10-CM

## 2018-05-21 DIAGNOSIS — R519 Headache, unspecified: Secondary | ICD-10-CM

## 2018-05-21 DIAGNOSIS — R0683 Snoring: Secondary | ICD-10-CM

## 2018-05-21 DIAGNOSIS — G4720 Circadian rhythm sleep disorder, unspecified type: Secondary | ICD-10-CM

## 2018-05-21 NOTE — Telephone Encounter (Signed)
Left message for Margaret Mcfarland with advise below. AS, CMA

## 2018-05-22 ENCOUNTER — Encounter (INDEPENDENT_AMBULATORY_CARE_PROVIDER_SITE_OTHER): Payer: BC Managed Care – PPO | Admitting: Neurology

## 2018-05-22 ENCOUNTER — Other Ambulatory Visit: Payer: Self-pay

## 2018-05-22 DIAGNOSIS — R51 Headache: Secondary | ICD-10-CM

## 2018-05-22 DIAGNOSIS — R519 Headache, unspecified: Secondary | ICD-10-CM

## 2018-05-22 DIAGNOSIS — R0683 Snoring: Secondary | ICD-10-CM

## 2018-05-22 DIAGNOSIS — G4752 REM sleep behavior disorder: Secondary | ICD-10-CM

## 2018-05-22 DIAGNOSIS — G4710 Hypersomnia, unspecified: Secondary | ICD-10-CM

## 2018-05-22 NOTE — Addendum Note (Signed)
Addended by: Lester Malmo A on: 05/22/2018 01:08 PM   Modules accepted: Orders

## 2018-05-26 DIAGNOSIS — M25512 Pain in left shoulder: Secondary | ICD-10-CM | POA: Insufficient documentation

## 2018-05-27 LAB — COMPREHENSIVE DRUG ANALYSIS,UR

## 2018-05-27 NOTE — Procedures (Signed)
PATIENT'S NAME:  Margaret Mcfarland, Margaret Mcfarland DOB:      17-May-1970      MR#:    [id number]     DATE OF RECORDING: 05/21/2018 REFERRING M.D.:  Mable Paris, FNP Study Performed:   Baseline Polysomnogram HISTORY: 48 year old right-handed woman with an underlying medical history of cataracts with status post surgery, history of retinal detachment, sciatica on the left, and anxiety, who reports feeling tired and sleepy. The patient endorsed the Epworth Sleepiness Scale at 12 points. The patient's weight 153 pounds with a height of 69 (inches), resulting in a BMI of 22.5 kg/m2. The patient's neck circumference measured 14 inches. The patient presents for a nocturnal PSG with next day MSLT. A UDS on 05/22/18 was negative.   CURRENT MEDICATIONS: Calcium/Vit D, Lexapro, Jolessa, Aldactone,   PROCEDURE:  This is a multichannel digital polysomnogram utilizing the Somnostar 11.2 system.  Electrodes and sensors were applied and monitored per AASM Specifications.   EEG, EOG, Chin and Limb EMG, were sampled at 200 Hz.  ECG, Snore and Nasal Pressure, Thermal Airflow, Respiratory Effort, CPAP Flow and Pressure, Oximetry was sampled at 50 Hz. Digital video and audio were recorded.      BASELINE STUDY  Lights Out was at 21:34 and Lights On at 05:26.  Total recording time (TRT) was 473 minutes, with a total sleep time (TST) of 374.5 minutes.   The patient's sleep latency was 41.5 minutes, which is delayed. REM latency was 65 minutes, which is slightly reduced.  The sleep efficiency was 79.2%.     SLEEP ARCHITECTURE: WASO (Wake after sleep onset) was 61.5 minutes with mild sleep fragmentation noted. There were 83 minutes in Stage N1, 123 minutes Stage N2, 105.5 minutes Stage N3 and 63 minutes in Stage REM.  The percentage of Stage N1 was 22.2%, which is increased, Stage N2 was 32.8%, Stage N3 was 28.2%, which is increased and Stage R (REM sleep) was 16.8%, which is mildly reduced. The arousals were noted as: 32 were spontaneous, 0 were  associated with PLMs, 0 were associated with respiratory events.  RESPIRATORY ANALYSIS:  There were a total of 0 respiratory events:  0 obstructive apneas, 0 central apneas and 0 mixed apneas with a total of 0 apneas and an apnea index (AI) of 0 /hour. There were 0 hypopneas with a hypopnea index of 0 /hour. The patient also had 0 respiratory event related arousals (RERAs).      The total APNEA/HYPOPNEA INDEX (AHI) was 0 /hour and the total RESPIRATORY DISTURBANCE INDEX was 0. 0 /hour.  0 events occurred in REM sleep and 0 events in NREM. The REM AHI was 0. 0 /hour, versus a non-REM AHI of 0. The patient spent 233 minutes of total sleep time in the supine position and 142 minutes in non-supine.. The supine AHI was 0.0 versus a non-supine AHI of 0.0.  OXYGEN SATURATION & C02:  The Wake baseline 02 saturation was 98%, with the lowest being 92%. Time spent below 89% saturation equaled 0 minutes.  PERIODIC LIMB MOVEMENTS: The patient had a total of 0 Periodic Limb Movements.  The Periodic Limb Movement (PLM) index was 0 and the PLM Arousal index was 0/hour.  Audio and video analysis did not show any abnormal or unusual movements, behaviors, phonations or vocalizations. The patient took no bathroom breaks. No significant snoring was noted. The EKG was in keeping with normal sinus rhythm (NSR).  Post-study, the patient indicated that sleep was worse than usual.   The patient had a  nap study, MSLT, next day on 05/22/18 with a mean sleep latency of 13.9 minutes for 5 naps and 0 SOREMPs (sleep onset REM periods) noted.   IMPRESSION:  1. Dysfunctions associated with sleep stages or arousal from sleep 2. Daytime somnolence  RECOMMENDATIONS:  1. The nighttime sleep study does not demonstrate any significant obstructive or central sleep disordered breathing.  2. The PSG shows sleep fragmentation and mildly abnormal sleep stage percentages; these are nonspecific findings and per se do not signify an  intrinsic sleep disorder or a cause for the patient's sleep-related symptoms. Causes include (but are not limited to) the first night effect of the sleep study, circadian rhythm disturbances, medication effect or an underlying mood disorder or medical problem.  3. The nocturnal PSG and next day MSLT indicate a mild degree of daytime somnolence, but in the context of just over 6 hours of total sleep time at night and some sleep fragmentation, decreased sleep efficiency during the night, these study results do not support the diagnosis of idiopathic hypersomnolence or narcolepsy.  4. The patient should be cautioned not to drive, work at heights, or operate dangerous or heavy equipment when tired or sleepy. Review and reiteration of good sleep hygiene measures should be pursued with any patient. 5. The patient will be seen in follow-up by Dr. Rexene Alberts at [facility] for discussion of the test results and further management strategies. The referring provider will be notified of the test results.  I certify that I have reviewed the entire raw data recording prior to the issuance of this report in accordance with the Standards of Accreditation of the American Academy of Sleep Medicine (AASM)   Star Age, MD, PhD Diplomat, American Board of Neurology and Sleep Medicine (Neurology and Sleep Medicine)

## 2018-06-05 ENCOUNTER — Telehealth: Payer: Self-pay

## 2018-06-05 ENCOUNTER — Telehealth: Payer: Self-pay | Admitting: Family

## 2018-06-05 NOTE — Telephone Encounter (Signed)
-----   Message from Star Age, MD sent at 06/05/2018  8:59 AM EDT ----- Patient referred by Ms. Arnett, NP, seen by me on 03/24/18, diagnostic PSG on 3/18, MSLT on 05/22/18.   Please call and notify the patient that the recent sleep study did not show any significant obstructive sleep apnea and next day MSLT showed a mild degree of sleepiness. No significant sleepiness, certainly not in the realm of narcolepsy or idiopathic hypersomnolence. She did not sleep all that great at night, just over 6 hours and had some sleep disruption. At this juncture, I would recommend she FU with primary care.  Please remind patient to try to maintain good sleep hygiene, which means: Keep a regular sleep and wake schedule and make enough time for sleep (7 1/2 to 8 1/2 hours for the average adult), try not to exercise or have a meal within 2 hours of your bedtime, try to keep your bedroom conducive for sleep, that is, cool and dark, without light distractors such as an illuminated alarm clock, and refrain from watching TV right before sleep or in the middle of the night and do not keep the TV or radio on during the night. If a nightlight is used, have it away from the visual field. Also, try not to use or play on electronic devices at bedtime, such as your cell phone, tablet PC or laptop. If you like to read at bedtime on an electronic device, try to dim the background light as much as possible. Do not eat in the middle of the night. Keep pets away from the bedroom environment. For stress relief, try meditation, deep breathing exercises (there are many books and CDs available), a white noise machine or fan can help to diffuse other noise distractors, such as traffic noise. Do not drink alcohol before bedtime, as it can disturb sleep and cause middle of the night awakenings. Never mix alcohol and sedating medications! Avoid narcotic pain medication close to bedtime, as opioids/narcotics can suppress breathing drive and breathing  effort.     Star Age, MD, PhD [facility])

## 2018-06-05 NOTE — Procedures (Signed)
Name:  Margaret Mcfarland, Gasner Reference [id number]  Study Date: 05/22/2018 Procedure #: [id number]  DOB: [birth year]    HISTORY: 48 year old right-handed woman with an underlying medical history of cataracts with status post surgery, history of retinal detachment, sciatica on the left, and anxiety, who reports feeling tired and sleepy. The patient endorsed the Epworth Sleepiness Scale at 12 points. The patient's weight 153 pounds with a height of 69 (inches), resulting in a BMI of 22.5 kg/m2. The patient's neck circumference measured 14 inches. The patient presents for a nocturnal PSG with next day MSLT. A UDS on 05/22/18 was negative.   Protocol  This is a 13 channel Multiple Sleep Latency Test comprised of 5 channels of EEG (T3-Cz, Cz-T4, F4-M1, C4-M1, O2-M1), 3 channels of Chin EMG, 4 channels of EOG and 1 channel for ECG.   All channels were sampled at 256hz .    This polysomnographic procedure is designed to evaluate (1) the complaint of excessive daytime sleepiness by quantifying the time required to fall asleep and (2) the possibility of narcolepsy by checking for abnormally short latencies to REM sleep.  Electrographic variables include EEG, EMG, EOG and ECG.  Patients are monitored throughout four or five 20-minute opportunities to sleep (naps) at two-hour intervals.  For each nap, the patient is allowed 20 minutes to fall asleep.  Once asleep, the patient is awakened after 15 minutes.  Between naps, the patient is kept as alert as possible.  A sleep latency of 20 minutes indicates that no sleep occurred.  Parametric Analysis  Total Number of Naps 5     NAP # Time of Nap  Sleep Latency (mins) REM Latency (mins) Sleep Time Percent Awake Time Percent  1 07:21 14.5 0 52 48   2 09:06 17 0 46  54   3 11:10 7.5 0 63  37   4 13:09 20 0 0  100   5 15:06 10.5 0 29  71    MSLT Summary of Naps  Sleepiness Index: 30.5  Mean Sleep Latency to all Five Naps: 13.9  Mean Sleep Latency to First Four Naps:  14.8  Mean Sleep Latency to First Three Naps: 13  Mean Sleep Latency to First Two Naps: 15.8  Number of Naps with REM Sleep: 0    Results from Preceding PSG Study  Sleep Onset Time 22:07 Sleep Efficiency (%) 79.2%  Rise Time 05:26 Sleep Latency (min) 33  Total Sleep Time  6.2hrs REM Latency (min) 65     INTERPRETATION:  The nap study, MSLT, on 05/22/18 showed a mean sleep latency of 13.9 minutes for 5 naps and 0 SOREMPs (sleep onset REM periods).   IMPRESSION:  1. Dysfunctions associated with sleep stages or arousal from sleep 2. Daytime somnolence  RECOMMENDATIONS:  1. The nighttime sleep study does not demonstrate any significant obstructive or central sleep disordered breathing.  2. The PSG shows sleep fragmentation and mildly abnormal sleep stage percentages; these are nonspecific findings and per se do not signify an intrinsic sleep disorder or a cause for the patient's sleep-related symptoms. Causes include (but are not limited to) the first night effect of the sleep study, circadian rhythm disturbances, medication effect or an underlying mood disorder or medical problem.  3. The nocturnal PSG and next day MSLT indicate a mild degree of daytime somnolence, but in the context of just over 6 hours of total sleep time at night and some sleep fragmentation, decreased sleep efficiency during the night, these study results do not support  the diagnosis of idiopathic hypersomnolence or narcolepsy.  4. The patient should be cautioned not to drive, work at heights, or operate dangerous or heavy equipment when tired or sleepy. Review and reiteration of good sleep hygiene measures should be pursued with any patient. 5. The patient will be seen in follow-up by Dr. Rexene Alberts at [facility] for discussion of the test results and further management strategies. The referring provider will be notified of the test results.  I certify that I have reviewed the entire raw data recording prior to the issuance of  this report in accordance with the Standards of Accreditation of the American Academy of Sleep Medicine (AASM)   Star Age, MD, PhD Diplomat, American Board of Neurology and Sleep Medicine (Neurology and Sleep Medicine)

## 2018-06-05 NOTE — Telephone Encounter (Signed)
Copied from [place] [id number]. Topic: General - Other >> Jun 05, 2018  4:31 PM Keene Breath wrote: Reason for CRM: Patient called to schedule a virtual visit with the doctor.  Patient would like to schedule for next week if possible.  Please call patient as soon as possible.  CB# [phone]

## 2018-06-05 NOTE — Telephone Encounter (Signed)
Patient returned call

## 2018-06-05 NOTE — Progress Notes (Signed)
Patient referred by Ms. Arnett, NP, seen by me on 03/24/18, diagnostic PSG on 3/18, MSLT on 05/22/18.   Please call and notify the patient that the recent sleep study did not show any significant obstructive sleep apnea and next day MSLT showed a mild degree of sleepiness. No significant sleepiness, certainly not in the realm of narcolepsy or idiopathic hypersomnolence. She did not sleep all that great at night, just over 6 hours and had some sleep disruption. At this juncture, I would recommend she FU with primary care.  Please remind patient to try to maintain good sleep hygiene, which means: Keep a regular sleep and wake schedule and make enough time for sleep (7 1/2 to 8 1/2 hours for the average adult), try not to exercise or have a meal within 2 hours of your bedtime, try to keep your bedroom conducive for sleep, that is, cool and dark, without light distractors such as an illuminated alarm clock, and refrain from watching TV right before sleep or in the middle of the night and do not keep the TV or radio on during the night. If a nightlight is used, have it away from the visual field. Also, try not to use or play on electronic devices at bedtime, such as your cell phone, tablet PC or laptop. If you like to read at bedtime on an electronic device, try to dim the background light as much as possible. Do not eat in the middle of the night. Keep pets away from the bedroom environment. For stress relief, try meditation, deep breathing exercises (there are many books and CDs available), a white noise machine or fan can help to diffuse other noise distractors, such as traffic noise. Do not drink alcohol before bedtime, as it can disturb sleep and cause middle of the night awakenings. Never mix alcohol and sedating medications! Avoid narcotic pain medication close to bedtime, as opioids/narcotics can suppress breathing drive and breathing effort.     Star Age, MD, PhD [facility])

## 2018-06-05 NOTE — Telephone Encounter (Signed)
I called pt. I advised pt that Dr. Rexene Alberts reviewed pt's sleep study and found that pt did not show any significant osa. Pt did show mild sleepiness in her MSLT but not in the realm of narcolepsy or idiopathic hypersomnolence. Dr. Rexene Alberts recommends that pt follow up with her PCP. I reviewed sleep hygiene recommendations with the pt, including trying to keep a regular sleep wake schedule, avoiding electronics in the bedroom, keeping the bedroom cool, dark, and quiet, and avoiding eating or exercising within 2 hours of bedtime as well as eating in the middle of the night. I advised pt to keep pets out of the bedroom. I discussed with pt the importance of stress relief and to try meditation, deep breathing exercises, and/or a white noise machine or fan to diffuse other noise distractors. I advised pt to not drink alcohol before bedtime and to never mix alcohol and sedating medications. Pt was advised to avoid narcotic pain medication close to bedtime. I advised pt that a copy of these sleep study results will be sent to Mable Paris, [place]. Pt verbalized understanding of results. Pt had no questions at this time but was encouraged to call back if questions arise.

## 2018-06-05 NOTE — Telephone Encounter (Signed)
I called pt to discuss her sleep study results. No answer, left a message asking her to call me back. 

## 2018-06-06 NOTE — Telephone Encounter (Signed)
Called pt.  No answer.  LMTCB to schedule appt.

## 2018-06-06 NOTE — Telephone Encounter (Signed)
I called left detailed VM asking to call back so we can set up virtual visit.

## 2018-06-09 ENCOUNTER — Ambulatory Visit: Payer: BC Managed Care – PPO | Admitting: Family

## 2018-06-09 ENCOUNTER — Telehealth: Payer: Self-pay

## 2018-06-09 NOTE — Telephone Encounter (Signed)
Copied from [place] [id number]. Topic: Appointment Scheduling - Scheduling Inquiry for Clinic >> Jun 09, 2018  3:29 PM Alanda Slim E wrote: Reason for CRM: Pt had an appt scheduled with [name] today at 3pm. Pt called about the appt but it was cancelled due to the office not being able to reach the Pt. / please advise Pt on a reschedule time and date

## 2018-06-14 ENCOUNTER — Other Ambulatory Visit: Payer: Self-pay | Admitting: Family

## 2018-06-14 DIAGNOSIS — F419 Anxiety disorder, unspecified: Secondary | ICD-10-CM

## 2018-06-16 ENCOUNTER — Encounter: Payer: Self-pay | Admitting: Family

## 2018-06-16 ENCOUNTER — Ambulatory Visit (INDEPENDENT_AMBULATORY_CARE_PROVIDER_SITE_OTHER): Payer: BC Managed Care – PPO | Admitting: Family

## 2018-06-16 ENCOUNTER — Other Ambulatory Visit: Payer: Self-pay

## 2018-06-16 DIAGNOSIS — M7989 Other specified soft tissue disorders: Secondary | ICD-10-CM | POA: Diagnosis not present

## 2018-06-16 DIAGNOSIS — R5383 Other fatigue: Secondary | ICD-10-CM | POA: Diagnosis not present

## 2018-06-16 DIAGNOSIS — M25512 Pain in left shoulder: Secondary | ICD-10-CM

## 2018-06-16 DIAGNOSIS — F419 Anxiety disorder, unspecified: Secondary | ICD-10-CM

## 2018-06-16 DIAGNOSIS — G8929 Other chronic pain: Secondary | ICD-10-CM

## 2018-06-16 NOTE — Progress Notes (Signed)
This visit type was conducted due to national recommendations for restrictions regarding the COVID-19 pandemic (e.g. social distancing).  This format is felt to be most appropriate for this patient at this time.  All issues noted in this document were discussed and addressed.  No physical exam was performed (except for noted visual exam findings with Video Visits). Virtual Visit via Video Note  I connected with@  on 06/16/18 at  3:30 PM EDT by a video enabled telemedicine application and verified that I am speaking with the correct person using two identifiers.  Location patient: home Location provider:work  Persons participating in the virtual visit: patient, provider  I discussed the limitations of evaluation and management by telemedicine and the availability of in person appointments. The patient expressed understanding and agreed to proceed.  This visit type was conducted due to national recommendations for restrictions regarding the COVID-19 pandemic (e.g. social distancing).  This format is felt to be most appropriate for this patient at this time.  All issues noted in this document were discussed and addressed.  No physical exam was performed (except for noted visual exam findings with Video Visits).  Interactive audio and video telecommunications were attempted between this provider and patient, however failed, due to patient having technical difficulties or patient did not have access to video capability.  We continued and completed visit with audio only.    HPI: Doing well. Feels well. No new complaints today.   Working at [employer] in [employer].    Fatigue- improves after good nights rest. Naps occasionally.   Anxiety- doing well. Feels well on lexapro. Like 10mg  dose.   Has been evaluated by sleep study,Dr Athar, no OSA.  Has seen Dr. Mack Guise in regards to left shoulder pain. Doing PT,  Shoulder pain and left shoulder pain has improved. Left foot and left shoulder x-ray  normal 04/2018.   LE edema- improved on compression stockings. Has been seen Dr Ronalee Belts for leg pain. F/u scheduled in May.   ROS: See pertinent positives and negatives per HPI.  Past Medical History:  Diagnosis Date  . Anxiety   . Blood in stool   . Cataract   . Chicken pox   . Endometriosis of uterus 05/23/1993   LAP  . Genital warts   . Kidney disease    per pt medical screening form  . Kidney stone   . Motion sickness    cars  . PONV (postoperative nausea and vomiting)    nausea only  . Retinal detachment   . Sciatica of left side     Past Surgical History:  Procedure Laterality Date  . CATARACT EXTRACTION W/PHACO Left 06/26/2017   Procedure: CATARACT EXTRACTION PHACO AND INTRAOCULAR LENS PLACEMENT ([place]) LEFT;  Surgeon: Leandrew Koyanagi, MD;  Location: [facility];  Service: Ophthalmology;  Laterality: Left;  . COLONOSCOPY N/A 08/25/2014   Procedure: COLONOSCOPY;  Surgeon: Manya Silvas, MD;  Location: [facility] ENDOSCOPY;  Service: Endoscopy;  Laterality: N/A;  . endoscopy    . GUM SURGERY  2008  . laproscopy      Family History  Problem Relation Age of Onset  . Colon cancer Father 17  . Arthritis Father   . Cancer Father        colon/prostate  . Hypertension Father   . Arthritis Mother   . Hyperlipidemia Mother   . Heart disease Mother   . Stroke Mother   . Hypertension Mother   . Kidney disease Mother   . Diabetes Mother  Type 1  . Congestive Heart Failure Mother   . Stroke Maternal Grandmother   . Cancer Maternal Grandfather        prostate  . Stroke Paternal Grandfather   . Breast cancer Cousin 53    SOCIAL HX: never smoker   Current Outpatient Medications:  .  Calcium Carbonate-Vitamin D (CALCIUM 600+D PO), Take by mouth daily., Disp: , Rfl:  .  escitalopram (LEXAPRO) 10 MG tablet, Take 1 tablet by mouth once daily, Disp: 90 tablet, Rfl: 0 .  levonorgestrel-ethinyl estradiol (JOLESSA) 0.15-0.03 MG tablet, Take 1 tablet by mouth  daily., Disp: 91 tablet, Rfl: 3 .  spironolactone (ALDACTONE) 25 MG tablet, Take 25 mg by mouth daily., Disp: , Rfl:    ASSESSMENT AND PLAN:  Discussed the following assessment and plan:  Leg swelling  Other fatigue  Anxiety  Chronic left shoulder pain  Problem List Items Addressed This Visit      Other   Anxiety    Feels well on Lexapro, will continue      Other fatigue    Improved after good night sleep.  Had a long discussion regards to sleep hygiene today.  Information given over phone and also will be mailed to patient.  She will let me know how she is doing      Leg swelling    Improved.  Patient continue follow-up with vascular      Left shoulder pain    Improved . has seen orthopedist, Dr. Mack Guise.   doing physical therapy with improvement.  Patient will let me know how she is doing           I discussed the assessment and treatment plan with the patient. The patient was provided an opportunity to ask questions and all were answered. The patient agreed with the plan and demonstrated an understanding of the instructions.   The patient was advised to call back or seek an in-person evaluation if the symptoms worsen or if the condition fails to improve as anticipated.   Mable Paris, FNP   I spent 15 min non face to face w/ pt.

## 2018-06-16 NOTE — Assessment & Plan Note (Signed)
Improved . has seen orthopedist, Dr. Mack Guise.   doing physical therapy with improvement.  Patient will let me know how she is doing

## 2018-06-16 NOTE — Assessment & Plan Note (Signed)
Feels well on Lexapro, will continue

## 2018-06-16 NOTE — Assessment & Plan Note (Signed)
Improved.  Patient continue follow-up with vascular

## 2018-06-16 NOTE — Assessment & Plan Note (Signed)
Improved after good night sleep.  Had a long discussion regards to sleep hygiene today.  Information given over phone and also will be mailed to patient.  She will let me know how she is doing

## 2018-06-16 NOTE — Patient Instructions (Addendum)
Happy to speak with you today!   Essentials for good sleep:    #1 Exercise #2 Limit Caffeine ( no caffeine after lunch) #3 No smart phones, TV prior to bed -- BLUE light is VERY activating and send the brain an 'awake message.'  #4 Go to bed at same time of night each night and get up at same time of day.  #5 Take 0.5 to 5mg  melatonin at 7pm with dinner -this is when natural melatonin will start to increase  Let me know if no improvement and if you need anything at all.    Insomnia Insomnia is a sleep disorder that makes it difficult to fall asleep or stay asleep. Insomnia can cause fatigue, low energy, difficulty concentrating, mood swings, and poor performance at work or school. There are three different ways to classify insomnia:  Difficulty falling asleep.  Difficulty staying asleep.  Waking up too early in the morning. Any type of insomnia can be long-term (chronic) or short-term (acute). Both are common. Short-term insomnia usually lasts for three months or less. Chronic insomnia occurs at least three times a week for longer than three months. What are the causes? Insomnia may be caused by another condition, situation, or substance, such as:  Anxiety.  Certain medicines.  Gastroesophageal reflux disease (GERD) or other gastrointestinal conditions.  Asthma or other breathing conditions.  Restless legs syndrome, sleep apnea, or other sleep disorders.  Chronic pain.  Menopause.  Stroke.  Abuse of alcohol, tobacco, or illegal drugs.  Mental health conditions, such as depression.  Caffeine.  Neurological disorders, such as Alzheimer's disease.  An overactive thyroid (hyperthyroidism). Sometimes, the cause of insomnia may not be known. What increases the risk? Risk factors for insomnia include:  Gender. Women are affected more often than men.  Age. Insomnia is more common as you get older.  Stress.  Lack of exercise.  Irregular work schedule or working  night shifts.  Traveling between different time zones.  Certain medical and mental health conditions. What are the signs or symptoms? If you have insomnia, the main symptom is having trouble falling asleep or having trouble staying asleep. This may lead to other symptoms, such as:  Feeling fatigued or having low energy.  Feeling nervous about going to sleep.  Not feeling rested in the morning.  Having trouble concentrating.  Feeling irritable, anxious, or depressed. How is this diagnosed? This condition may be diagnosed based on:  Your symptoms and medical history. Your health care provider may ask about: ? Your sleep habits. ? Any medical conditions you have. ? Your mental health.  A physical exam. How is this treated? Treatment for insomnia depends on the cause. Treatment may focus on treating an underlying condition that is causing insomnia. Treatment may also include:  Medicines to help you sleep.  Counseling or therapy.  Lifestyle adjustments to help you sleep better. Follow these instructions at home: Eating and drinking   Limit or avoid alcohol, caffeinated beverages, and cigarettes, especially close to bedtime. These can disrupt your sleep.  Do not eat a large meal or eat spicy foods right before bedtime. This can lead to digestive discomfort that can make it hard for you to sleep. Sleep habits   Keep a sleep diary to help you and your health care provider figure out what could be causing your insomnia. Write down: ? When you sleep. ? When you wake up during the night. ? How well you sleep. ? How rested you feel the next day. ?  Any side effects of medicines you are taking. ? What you eat and drink.  Make your bedroom a dark, comfortable place where it is easy to fall asleep. ? Put up shades or blackout curtains to block light from outside. ? Use a white noise machine to block noise. ? Keep the temperature cool.  Limit screen use before bedtime. This  includes: ? Watching TV. ? Using your smartphone, tablet, or computer.  Stick to a routine that includes going to bed and waking up at the same times every day and night. This can help you fall asleep faster. Consider making a quiet activity, such as reading, part of your nighttime routine.  Try to avoid taking naps during the day so that you sleep better at night.  Get out of bed if you are still awake after 15 minutes of trying to sleep. Keep the lights down, but try reading or doing a quiet activity. When you feel sleepy, go back to bed. General instructions  Take over-the-counter and prescription medicines only as told by your health care provider.  Exercise regularly, as told by your health care provider. Avoid exercise starting several hours before bedtime.  Use relaxation techniques to manage stress. Ask your health care provider to suggest some techniques that may work well for you. These may include: ? Breathing exercises. ? Routines to release muscle tension. ? Visualizing peaceful scenes.  Make sure that you drive carefully. Avoid driving if you feel very sleepy.  Keep all follow-up visits as told by your health care provider. This is important. Contact a health care provider if:  You are tired throughout the day.  You have trouble in your daily routine due to sleepiness.  You continue to have sleep problems, or your sleep problems get worse. Get help right away if:  You have serious thoughts about hurting yourself or someone else. If you ever feel like you may hurt yourself or others, or have thoughts about taking your own life, get help right away. You can go to your nearest emergency department or call:  Your local emergency services (911 in the [place]  A suicide crisis helpline, such as the [place] at (203)176-7392. This is open 24 hours a day. Summary  Insomnia is a sleep disorder that makes it difficult to fall asleep or stay  asleep.  Insomnia can be long-term (chronic) or short-term (acute).  Treatment for insomnia depends on the cause. Treatment may focus on treating an underlying condition that is causing insomnia.  Keep a sleep diary to help you and your health care provider figure out what could be causing your insomnia. This information is not intended to replace advice given to you by your health care provider. Make sure you discuss any questions you have with your health care provider. Document Released: 02/17/2000 Document Revised: 11/29/2016 Document Reviewed: 11/29/2016 Elsevier Interactive Patient Education  2019 [employer].

## 2018-07-21 ENCOUNTER — Other Ambulatory Visit: Payer: Self-pay

## 2018-07-21 ENCOUNTER — Ambulatory Visit (INDEPENDENT_AMBULATORY_CARE_PROVIDER_SITE_OTHER): Payer: BC Managed Care – PPO | Admitting: Vascular Surgery

## 2018-07-21 ENCOUNTER — Encounter (INDEPENDENT_AMBULATORY_CARE_PROVIDER_SITE_OTHER): Payer: Self-pay | Admitting: Vascular Surgery

## 2018-07-21 ENCOUNTER — Ambulatory Visit (INDEPENDENT_AMBULATORY_CARE_PROVIDER_SITE_OTHER): Payer: BC Managed Care – PPO

## 2018-07-21 VITALS — BP 118/72 | HR 71 | Resp 16 | Wt 154.0 lb

## 2018-07-21 DIAGNOSIS — I872 Venous insufficiency (chronic) (peripheral): Secondary | ICD-10-CM

## 2018-07-21 DIAGNOSIS — R600 Localized edema: Secondary | ICD-10-CM

## 2018-07-21 DIAGNOSIS — M79606 Pain in leg, unspecified: Secondary | ICD-10-CM | POA: Diagnosis not present

## 2018-07-21 DIAGNOSIS — I890 Lymphedema, not elsewhere classified: Secondary | ICD-10-CM | POA: Diagnosis not present

## 2018-07-21 DIAGNOSIS — M7989 Other specified soft tissue disorders: Secondary | ICD-10-CM

## 2018-07-23 ENCOUNTER — Encounter (INDEPENDENT_AMBULATORY_CARE_PROVIDER_SITE_OTHER): Payer: Self-pay | Admitting: Vascular Surgery

## 2018-07-23 DIAGNOSIS — I872 Venous insufficiency (chronic) (peripheral): Secondary | ICD-10-CM | POA: Insufficient documentation

## 2018-07-23 NOTE — Progress Notes (Signed)
MRN : [id number]  Margaret Mcfarland is a 48 y.o. ([birth year]) female who presents with chief complaint of  Chief Complaint  Patient presents with   Follow-up    104month ultrasound follow up  .  History of Present Illness:  The patient returns to the office for followup evaluation regarding leg swelling.  The swelling has persisted and the pain associated with swelling continues. There have not been any interval development of a ulcerations or wounds.  Since the previous visit the patient has been wearing graduated compression stockings and has noted little if any improvement in the lymphedema. The patient has been using compression routinely morning until night.  The patient also states elevation during the day and exercise is being done too.   Current Meds  Medication Sig   Calcium Carbonate-Vitamin D (CALCIUM 600+D PO) Take by mouth daily.   escitalopram (LEXAPRO) 10 MG tablet Take 1 tablet by mouth once daily   levonorgestrel-ethinyl estradiol (JOLESSA) 0.15-0.03 MG tablet Take 1 tablet by mouth daily.   spironolactone (ALDACTONE) 25 MG tablet Take 25 mg by mouth daily.    Past Medical History:  Diagnosis Date   Anxiety    Blood in stool    Cataract    Chicken pox    Endometriosis of uterus 05/23/1993   LAP   Genital warts    Kidney disease    per pt medical screening form   Kidney stone    Motion sickness    cars   PONV (postoperative nausea and vomiting)    nausea only   Retinal detachment    Sciatica of left side     Past Surgical History:  Procedure Laterality Date   CATARACT EXTRACTION W/PHACO Left 06/26/2017   Procedure: CATARACT EXTRACTION PHACO AND INTRAOCULAR LENS PLACEMENT ([place]) LEFT;  Surgeon: Leandrew Koyanagi, MD;  Location: [facility];  Service: Ophthalmology;  Laterality: Left;   COLONOSCOPY N/A 08/25/2014   Procedure: COLONOSCOPY;  Surgeon: Manya Silvas, MD;  Location: [facility] ENDOSCOPY;  Service: Endoscopy;  Laterality:  N/A;   endoscopy     GUM SURGERY  2008   laproscopy      Social History Social History   Tobacco Use   Smoking status: Never Smoker   Smokeless tobacco: Never Used  Substance Use Topics   Alcohol use: No    Alcohol/week: 0.0 standard drinks   Drug use: No    Family History Family History  Problem Relation Age of Onset   Colon cancer Father 64   Arthritis Father    Cancer Father        colon/prostate   Hypertension Father    Arthritis Mother    Hyperlipidemia Mother    Heart disease Mother    Stroke Mother    Hypertension Mother    Kidney disease Mother    Diabetes Mother        Type 1   Congestive Heart Failure Mother    Stroke Maternal Grandmother    Cancer Maternal Grandfather        prostate   Stroke Paternal Grandfather    Breast cancer Cousin 35    Allergies  Allergen Reactions   Sertraline Diarrhea     REVIEW OF SYSTEMS (Negative unless checked)  Constitutional: [] Weight loss  [] Fever  [] Chills Cardiac: [] Chest pain   [] Chest pressure   [] Palpitations   [] Shortness of breath when laying flat   [] Shortness of breath with exertion. Vascular:  [] Pain in legs with walking   [x] Pain in  legs at rest  [] History of DVT   [] Phlebitis   [x] Swelling in legs   [] Varicose veins   [] Non-healing ulcers Pulmonary:   [] Uses home oxygen   [] Productive cough   [] Hemoptysis   [] Wheeze  [] COPD   [] Asthma Neurologic:  [] Dizziness   [] Seizures   [] History of stroke   [] History of TIA  [] Aphasia   [] Vissual changes   [] Weakness or numbness in arm   [] Weakness or numbness in leg Musculoskeletal:   [] Joint swelling   [] Joint pain   [] Low back pain Hematologic:  [] Easy bruising  [] Easy bleeding   [] Hypercoagulable state   [] Anemic Gastrointestinal:  [] Diarrhea   [] Vomiting  [] Gastroesophageal reflux/heartburn   [] Difficulty swallowing. Genitourinary:  [] Chronic kidney disease   [] Difficult urination  [] Frequent urination   [] Blood in urine Skin:  [] Rashes    [] Ulcers  Psychological:  [] History of anxiety   []  History of major depression.  Physical Examination  Vitals:   07/21/18 1609  BP: 118/72  Pulse: 71  Resp: 16  Weight: 154 lb (69.9 kg)   Body mass index is 22.74 kg/m. Gen: WD/WN, NAD Head: [place]/AT, No temporalis wasting.  Ear/Nose/Throat: Hearing grossly intact, nares w/o erythema or drainage Eyes: PER, EOMI, sclera nonicteric.  Neck: Supple, no large masses.   Pulmonary:  Good air movement, no audible wheezing bilaterally, no use of accessory muscles.  Cardiac: RRR, no JVD Vascular: scattered varicosities present bilaterally.  moderate venous stasis changes to the legs bilaterally.  3+ soft pitting edema Vessel Right Left  Radial Palpable Palpable  PT Palpable Palpable  DP Palpable Palpable  Gastrointestinal: Non-distended. No guarding/no peritoneal signs.  Musculoskeletal: M/S 5/5 throughout.  No deformity or atrophy.  Neurologic: CN 2-12 intact. Symmetrical.  Speech is fluent. Motor exam as listed above. Psychiatric: Judgment intact, Mood & affect appropriate for pt's clinical situation. Dermatologic: Venous rashes no ulcers noted.  No changes consistent with cellulitis. Lymph : No lichenification or skin changes of chronic lymphedema.  CBC Lab Results  Component Value Date   WBC 6.3 01/08/2018   HGB 14.0 01/08/2018   HCT 42.1 01/08/2018   MCV 91.3 01/08/2018   PLT 252.0 01/08/2018    BMET    Component Value Date/Time   NA 136 01/08/2018 1546   K 4.3 01/08/2018 1546   CL 103 01/08/2018 1546   CO2 28 01/08/2018 1546   GLUCOSE 83 01/08/2018 1546   BUN 14 01/08/2018 1546   CREATININE 0.75 01/08/2018 1546   CREATININE 0.73 07/01/2015 1527   CALCIUM 9.4 01/08/2018 1546   GFRNONAA >60 05/30/2017 1840   GFRAA >60 05/30/2017 1840   CrCl cannot be calculated (Patient's most recent lab result is older than the maximum 21 days allowed.).  COAG Lab Results  Component Value Date   INR 1.05 07/01/2015     Radiology Vas [place] Lower Extremity Venous Reflux  Result Date: 07/21/2018  Lower Venous Reflux Study Indications: Pain, and Swelling.  Performing Technologist: Almira Coaster RVS  Examination Guidelines: A complete evaluation includes B-mode imaging, spectral Doppler, color Doppler, and power Doppler as needed of all accessible portions of each vessel. Bilateral testing is considered an integral part of a complete examination. Limited examinations for reoccurring indications may be performed as noted. The reflux portion of the exam is performed with the patient in reverse Trendelenburg.  +---------+---------------+---------+-----------+----------+-------+  RIGHT     Compressibility Phasicity Spontaneity Properties Summary  +---------+---------------+---------+-----------+----------+-------+  CFV       Full  Yes       Yes                             +---------+---------------+---------+-----------+----------+-------+  SFJ       Full            Yes       Yes                             +---------+---------------+---------+-----------+----------+-------+  FV Prox   Full            Yes       Yes                             +---------+---------------+---------+-----------+----------+-------+  FV Mid    Full            Yes       Yes                             +---------+---------------+---------+-----------+----------+-------+  FV Distal Full            Yes       Yes                             +---------+---------------+---------+-----------+----------+-------+  PFV       Full            Yes       Yes                             +---------+---------------+---------+-----------+----------+-------+  POP       Full            Yes       Yes                             +---------+---------------+---------+-----------+----------+-------+  GSV       Full            Yes       Yes                             +---------+---------------+---------+-----------+----------+-------+  SSV       Full            Yes        Yes                             +---------+---------------+---------+-----------+----------+-------+   +---------+---------------+---------+-----------+----------+-------+  LEFT      Compressibility Phasicity Spontaneity Properties Summary  +---------+---------------+---------+-----------+----------+-------+  CFV       Full            Yes       Yes                             +---------+---------------+---------+-----------+----------+-------+  SFJ       Full            Yes       Yes                             +---------+---------------+---------+-----------+----------+-------+  FV Prox   Full            Yes       Yes                             +---------+---------------+---------+-----------+----------+-------+  FV Mid    Full            Yes       Yes                             +---------+---------------+---------+-----------+----------+-------+  FV Distal Full            Yes       Yes                             +---------+---------------+---------+-----------+----------+-------+  PFV       Full            Yes       Yes                             +---------+---------------+---------+-----------+----------+-------+  POP       Full            Yes       Yes                             +---------+---------------+---------+-----------+----------+-------+  GSV       Full            Yes       Yes                             +---------+---------------+---------+-----------+----------+-------+  SSV       Full            Yes       Yes                             +---------+---------------+---------+-----------+----------+-------+   +------------------------------+----------+---------+  VEIN DIAMETERS:                Right (cm) Left (cm)  +------------------------------+----------+---------+  GSV at Saphenofemoral junction 0.75       .62        +------------------------------+----------+---------+  GSV at prox thigh              .67        .46        +------------------------------+----------+---------+  GSV at mid  thigh               .61        .53        +------------------------------+----------+---------+  GSV at distal thigh            .60        .49        +------------------------------+----------+---------+  GSV at knee                    .53        .54        +------------------------------+----------+---------+  GSV prox calf                  .  52        .48        +------------------------------+----------+---------+  SSV origin                     .39        .30        +------------------------------+----------+---------+  Right Reflux Technical Findings: Reflux seen in the Right SFJ exceeding 547ms.  Summary: Right: Abnormal reflux times were noted in the great saphenous vein at the saphenofemoral junction. Evidence of chronic venous insufficiency is detected in the great saphenous vein. There is no evidence of deep vein thrombosis in the lower extremity.There is no evidence of superficial venous thrombosis. Left: There is no evidence of deep vein thrombosis in the lower extremity.There is no evidence of chronic venous insufficiency.There is no evidence of superficial venous thrombosis.  *See table(s) above for measurements and observations. Electronically signed by Hortencia Pilar MD on 07/21/2018 at 4:47:30 PM.    Final       Assessment/Plan 1. Lymphedema Recommend:  No surgery or intervention at this point in time.    I have reviewed my previous discussion with the patient regarding swelling and why it causes symptoms.  Patient will continue wearing graduated compression stockings class 1 (20-30 mmHg) on a daily basis. The patient will  beginning wearing the stockings first thing in the morning and removing them in the evening. The patient is instructed specifically not to sleep in the stockings.    In addition, behavioral modification including several periods of elevation of the lower extremities during the day will be continued.  This was reviewed with the patient during the initial visit.  The  patient will also continue routine exercise, especially walking on a daily basis as was discussed during the initial visit.    Despite conservative treatments including graduated compression therapy class 1 and behavioral modification including exercise and elevation the patient  has not obtained adequate control of the lymphedema.  The patient still has stage 3 lymphedema and therefore, I believe that a lymph pump should be added to improve the control of the patient's lymphedema.  Additionally, a lymph pump is warranted because it will reduce the risk of cellulitis and ulceration in the future.  Patient should follow-up in six months    2. Chronic venous insufficiency Recommend:  No surgery or intervention at this point in time.    I have reviewed my previous discussion with the patient regarding swelling and why it causes symptoms.  Patient will continue wearing graduated compression stockings class 1 (20-30 mmHg) on a daily basis. The patient will  beginning wearing the stockings first thing in the morning and removing them in the evening. The patient is instructed specifically not to sleep in the stockings.    In addition, behavioral modification including several periods of elevation of the lower extremities during the day will be continued.  This was reviewed with the patient during the initial visit.  The patient will also continue routine exercise, especially walking on a daily basis as was discussed during the initial visit.    Despite conservative treatments including graduated compression therapy class 1 and behavioral modification including exercise and elevation the patient  has not obtained adequate control of the lymphedema.  The patient still has stage 3 lymphedema and therefore, I believe that a lymph pump should be added to improve the control of the patient's lymphedema.  Additionally, a lymph pump is warranted because it will reduce the risk of  cellulitis and ulceration in the  future.  Patient should follow-up in six months      Hortencia Pilar, MD  07/23/2018 8:46 AM

## 2018-08-27 ENCOUNTER — Telehealth: Payer: Self-pay

## 2018-08-27 NOTE — Telephone Encounter (Signed)
Pt's twin sister calling; face is dark d/t bcp.  [facility] told them about something they could get to put on their face to keep it looking so dark.  What is it?  [phone]

## 2018-08-28 NOTE — Telephone Encounter (Signed)
Pt aware.

## 2018-08-28 NOTE — Telephone Encounter (Signed)
Has melasma--due to estrogen OCPs. Must use sunscreen during summer/minimize sun on face. OTC meds won't help till fall. Can then do products containing hydroquinone (can be expensive). Sx will resolve once they stop OCPs in near future. Can tell twin sister, [name], same thing.

## 2018-09-08 ENCOUNTER — Other Ambulatory Visit: Payer: Self-pay | Admitting: Family

## 2018-09-08 DIAGNOSIS — F419 Anxiety disorder, unspecified: Secondary | ICD-10-CM

## 2018-12-09 ENCOUNTER — Ambulatory Visit: Payer: Self-pay

## 2018-12-09 NOTE — Telephone Encounter (Addendum)
Incoming call from Patient.  Parient reports that seh fell about a week ago Saturday.   Injured right ankle.   She can bear weight.  On it.    Patient states that it does not look swollen.  Pain is rated mild.  Unsure if Tetanus is current  LMP May 12th..  Patient also complains of he bladder area hurting.  With mild pain.  Onset last couple days.   Patient request xray for right leg and bladder be assessed.  Attempted to call office no answer Please get in touch with Patient.Patient awaits  Return call from Provider.   Patient reqest an appointment after 2pm please.     Reason for Disposition . Side (flank) or lower back pain present  Answer Assessment - Initial Assessment Questions 1. MECHANISM: "How did the injury happen?" (e.g., twisting injury, direct blow)      Missed a step fell down right side 2. ONSET: "When did the injury happen?" (Minutes or hours ago)      A week ago Saturday 3. LOCATION: "Where is the injury located?"     Right ankle  4. APPEARANCE of INJURY: "What does the injury look like?"      dosent look swollens 5. WEIGHT-BEARING: "Can you put weight on that foot?" "Can you walk (four steps or more)?"      Yes can put Pressure on it.  6. SIZE: For cuts, bruises, or swelling, ask: "How large is it?" (e.g., inches or centimeters;  entire joint)      na 7. PAIN: "Is there pain?" If so, ask: "How bad is the pain?"    (e.g., Scale 1-10; or mild, moderate, severe)     mnild 8. TETANUS: For any breaks in the skin, ask: "When was the last tetanus booster?"    "  I doubt it is , not sure" 9. OTHER SYMPTOMS: "Do you have any other symptoms?"      deniues 10. PREGNANCY: "Is there any chance you are pregnant?" "When was your last menstrual period?"       May 12. 2020  Answer Assessment - Initial Assessment Questions 1. SYMPTOM: "What's the main symptom you're concerned about?" (e.g., frequency, incontinence)    bladder area hurting   No pain.  No burning 2. ONSET: "When did  the  *No Answer*  start?"     Las couple of days  3. PAIN: "Is there any pain?" If so, ask: "How bad is it?" (Scale: 1-10; mild, moderate, severe)     Mild pain 4. CAUSE: "What do you think is causing the symptoms?"     *No Answer* 5. OTHER SYMPTOMS: "Do you have any other symptoms?" (e.g., fever, flank pain, blood in urine, pain with urination)     No painEGNANCY: "Is there any chance you are pregnant?" "When was your last menstrual period?"      Not pregnant.  Protocols used: URINARY SYMPTOMS-A-AH, ANKLE AND FOOT INJURY-A-AH

## 2018-12-09 NOTE — Telephone Encounter (Signed)
PCP out of office advised patient she would need to be seen before available appointment in office on Friday patient stated she would go to [facility] urgent care or to [facility] walkin does not want appt at [facility] FYI

## 2018-12-11 ENCOUNTER — Other Ambulatory Visit: Payer: Self-pay | Admitting: Family

## 2018-12-11 DIAGNOSIS — F419 Anxiety disorder, unspecified: Secondary | ICD-10-CM

## 2018-12-30 ENCOUNTER — Other Ambulatory Visit: Payer: Self-pay

## 2018-12-30 ENCOUNTER — Encounter: Payer: Self-pay | Admitting: Urology

## 2018-12-30 ENCOUNTER — Ambulatory Visit: Payer: BC Managed Care – PPO | Admitting: Urology

## 2018-12-30 VITALS — BP 129/77 | HR 71 | Ht 69.0 in | Wt 156.0 lb

## 2018-12-30 DIAGNOSIS — N200 Calculus of kidney: Secondary | ICD-10-CM | POA: Diagnosis not present

## 2018-12-30 DIAGNOSIS — G8929 Other chronic pain: Secondary | ICD-10-CM

## 2018-12-30 DIAGNOSIS — M545 Low back pain: Secondary | ICD-10-CM | POA: Diagnosis not present

## 2018-12-30 DIAGNOSIS — R3989 Other symptoms and signs involving the genitourinary system: Secondary | ICD-10-CM | POA: Diagnosis not present

## 2018-12-30 LAB — URINALYSIS, COMPLETE
Bilirubin, UA: NEGATIVE
Glucose, UA: NEGATIVE
Ketones, UA: NEGATIVE
Leukocytes,UA: NEGATIVE
Nitrite, UA: NEGATIVE
Protein,UA: NEGATIVE
Specific Gravity, UA: 1.025 (ref 1.005–1.030)
Urobilinogen, Ur: 0.2 mg/dL (ref 0.2–1.0)
pH, UA: 7 (ref 5.0–7.5)

## 2018-12-30 LAB — MICROSCOPIC EXAMINATION

## 2018-12-30 NOTE — Progress Notes (Signed)
12/30/2018 9:17 AM   Godfrey Pick 09-20-1970 [id number]  Referring provider: Burnard Hawthorne, FNP [address] [place],  [place] [address]  Chief Complaint  Patient presents with  . Abdominal Pain    HPI: 48 year old female with a personal history of a punctate kidney stone who presents today complaining of back pain occasional bladder pain.  She reports over the past several weeks, she has had diffuse lower blood back pain which stretches across her entire mid back.  The pain is present when she wakes up in the morning and improves with ibuprofen.  By midday when ibuprofen wears off, her pain recurs.  This is cyclic throughout the day.  It is exacerbated by activity.  She does have a personal history of back pain previously received physical therapy for this.  She also reports today that she has severe intermittent episodes of "bladder pain".  These are extremely infrequent and are crampy pain in her deep pelvis with sudden onset which resolves quickly.  No associated urinary symptoms.  No dysuria or gross hematuria.  Urinalysis today is negative.  She has been seen in the past in 2019 for a punctate 2 mm left lower pole stone.   PMH: Past Medical History:  Diagnosis Date  . Anxiety   . Blood in stool   . Cataract   . Chicken pox   . Endometriosis of uterus 05/23/1993   LAP  . Genital warts   . Kidney disease    per pt medical screening form  . Kidney stone   . Motion sickness    cars  . PONV (postoperative nausea and vomiting)    nausea only  . Retinal detachment   . Sciatica of left side     Surgical History: Past Surgical History:  Procedure Laterality Date  . CATARACT EXTRACTION W/PHACO Left 06/26/2017   Procedure: CATARACT EXTRACTION PHACO AND INTRAOCULAR LENS PLACEMENT ([facility]) LEFT;  Surgeon: Leandrew Koyanagi, MD;  Location: [facility];  Service: Ophthalmology;  Laterality: Left;  . COLONOSCOPY N/A 08/25/2014   Procedure: COLONOSCOPY;   Surgeon: Manya Silvas, MD;  Location: [facility] ENDOSCOPY;  Service: Endoscopy;  Laterality: N/A;  . endoscopy    . GUM SURGERY  2008  . laproscopy      Home Medications:  Allergies as of 12/30/2018      Reactions   Sertraline Diarrhea      Medication List       Accurate as of December 30, 2018  9:17 AM. If you have any questions, ask your nurse or doctor.        STOP taking these medications   levonorgestrel-ethinyl estradiol 0.15-0.03 MG tablet Commonly known as: Merchant navy officer Stopped by: Hollice Espy, MD     TAKE these medications   CALCIUM 600+D PO Take by mouth daily.   escitalopram 10 MG tablet Commonly known as: LEXAPRO Take 1 tablet by mouth once daily   spironolactone 25 MG tablet Commonly known as: ALDACTONE Take 25 mg by mouth daily.       Allergies:  Allergies  Allergen Reactions  . Sertraline Diarrhea    Family History: Family History  Problem Relation Age of Onset  . Colon cancer Father 75  . Arthritis Father   . Cancer Father        colon/prostate  . Hypertension Father   . Arthritis Mother   . Hyperlipidemia Mother   . Heart disease Mother   . Stroke Mother   . Hypertension Mother   .  Kidney disease Mother   . Diabetes Mother        Type 1  . Congestive Heart Failure Mother   . Stroke Maternal Grandmother   . Cancer Maternal Grandfather        prostate  . Stroke Paternal Grandfather   . Breast cancer Cousin 65    Social History:  reports that she has never smoked. She has never used smokeless tobacco. She reports that she does not drink alcohol or use drugs.  ROS: UROLOGY Frequent Urination?: No Hard to postpone urination?: No Burning/pain with urination?: No Get up at night to urinate?: No Leakage of urine?: No Urine stream starts and stops?: No Trouble starting stream?: Yes Do you have to strain to urinate?: No Blood in urine?: No Urinary tract infection?: No Sexually transmitted disease?: No Injury to kidneys or  bladder?: No Painful intercourse?: No Weak stream?: Yes Currently pregnant?: No Vaginal bleeding?: No Last menstrual period?: N  Gastrointestinal Nausea?: No Vomiting?: No Indigestion/heartburn?: No Diarrhea?: No Constipation?: No  Constitutional Fever: No Night sweats?: No Weight loss?: No Fatigue?: No  Skin Skin rash/lesions?: No Itching?: No  Eyes Blurred vision?: No Double vision?: No  Ears/Nose/Throat Sore throat?: No Sinus problems?: No  Hematologic/Lymphatic Swollen glands?: No Easy bruising?: No  Cardiovascular Leg swelling?: No Chest pain?: No  Respiratory Cough?: Yes Shortness of breath?: No  Endocrine Excessive thirst?: No  Musculoskeletal Back pain?: Yes Joint pain?: No  Neurological Headaches?: No Dizziness?: Yes  Psychologic Depression?: No Anxiety?: Yes  Physical Exam: BP 129/77   Pulse 71   Ht 5\' 9"  (1.753 m)   Wt 156 lb (70.8 kg)   BMI 23.04 kg/m   Constitutional:  Alert and oriented, No acute distress. HEENT: [place] AT, moist mucus membranes.  Trachea midline, no masses. Cardiovascular: No clubbing, cyanosis, or edema. Respiratory: Normal respiratory effort, no increased work of breathing. GI: Abdomen is soft, nontender, nondistended, no abdominal masses GU: No CVA tenderness.  Indicates discomfort is in paraspinous region extending across the iliac crest bilaterally. Skin: No rashes, bruises or suspicious lesions. Neurologic: Grossly intact, no focal deficits, moving all 4 extremities. Psychiatric: Normal mood and affect.  Laboratory Data: Lab Results  Component Value Date   WBC 6.3 01/08/2018   HGB 14.0 01/08/2018   HCT 42.1 01/08/2018   MCV 91.3 01/08/2018   PLT 252.0 01/08/2018    Lab Results  Component Value Date   CREATININE 0.75 01/08/2018   Lab Results  Component Value Date   HGBA1C 5.5 01/08/2018    Urinalysis UA today reviewed, completely negative  Pertinent Imaging: Previous CT scan from 2019  personally reviewed  Assessment & Plan:    1. Chronic bilateral low back pain without sciatica Based on the nature of her pain and improvement with NSAIDs, I am strongly suspicious that this is musculoskeletal related rather than related to her kidneys  I have recommended that she resume some of her physical therapy activities and follow-up with her PCP  - Urinalysis, Complete - Ultrasound renal complete; Future  2. Bladder pain Urinalysis today is completely negative, episodes of sudden onset pelvic pain which resolves quickly very infrequent  Bladder pathology not suspected  3. Kidney stone on left side Personal history of punctate left kidney stone, do not think this is related to her current presentation  She is interested in renal ultrasound to ensure that the stone has not moved or changed which is reasonable, will call her with these results   Hollice Espy, MD  [facility] [address] [place], [place] [address] [phone]

## 2019-01-02 ENCOUNTER — Telehealth: Payer: Self-pay | Admitting: Urology

## 2019-01-02 NOTE — Telephone Encounter (Signed)
Pt wants UA results

## 2019-01-02 NOTE — Telephone Encounter (Signed)
Its normal

## 2019-01-02 NOTE — Telephone Encounter (Signed)
Left VM with details-asked to return call with any questions.

## 2019-01-06 ENCOUNTER — Other Ambulatory Visit: Payer: Self-pay

## 2019-01-06 ENCOUNTER — Ambulatory Visit
Admission: RE | Admit: 2019-01-06 | Discharge: 2019-01-06 | Disposition: A | Discharge status: Home / Self Care | Payer: BC Managed Care – PPO | Source: Ambulatory Visit | Attending: Urology | Admitting: Urology

## 2019-01-06 DIAGNOSIS — M545 Low back pain, unspecified: Secondary | ICD-10-CM

## 2019-01-06 DIAGNOSIS — G8929 Other chronic pain: Secondary | ICD-10-CM | POA: Diagnosis present

## 2019-01-07 ENCOUNTER — Telehealth: Payer: Self-pay | Admitting: *Deleted

## 2019-01-07 NOTE — Telephone Encounter (Signed)
Two years with KUB.  Hollice Espy, MD

## 2019-01-07 NOTE — Telephone Encounter (Signed)
Made her a two year follow up KUB prior

## 2019-01-07 NOTE — Telephone Encounter (Addendum)
Patient informed-she verbalized understanding. Wanted to know if she needed a yearly follow up or 6 month follow with an [place]?   ----- Message from Hollice Espy, MD sent at 01/06/2019  6:39 PM EST ----- Renal ultrasound looks good.  She has the same unchanged punctate kidney stone which is nonobstructed and should be not causing any pain.  I recommend continued observation for this.  Great news.  Hollice Espy, MD

## 2019-01-19 ENCOUNTER — Other Ambulatory Visit: Payer: Self-pay | Admitting: Obstetrics and Gynecology

## 2019-01-19 DIAGNOSIS — Z1231 Encounter for screening mammogram for malignant neoplasm of breast: Secondary | ICD-10-CM

## 2019-01-21 ENCOUNTER — Ambulatory Visit: Payer: BC Managed Care – PPO | Admitting: Urology

## 2019-01-26 ENCOUNTER — Ambulatory Visit (INDEPENDENT_AMBULATORY_CARE_PROVIDER_SITE_OTHER): Payer: BC Managed Care – PPO | Admitting: Vascular Surgery

## 2019-01-27 ENCOUNTER — Ambulatory Visit: Payer: BC Managed Care – PPO | Admitting: Urology

## 2019-02-04 ENCOUNTER — Ambulatory Visit (INDEPENDENT_AMBULATORY_CARE_PROVIDER_SITE_OTHER): Payer: BC Managed Care – PPO | Admitting: Obstetrics and Gynecology

## 2019-02-04 ENCOUNTER — Other Ambulatory Visit: Payer: Self-pay

## 2019-02-04 ENCOUNTER — Encounter: Payer: Self-pay | Admitting: Obstetrics and Gynecology

## 2019-02-04 VITALS — BP 90/70 | Ht 68.5 in | Wt 160.0 lb

## 2019-02-04 DIAGNOSIS — Z01419 Encounter for gynecological examination (general) (routine) without abnormal findings: Secondary | ICD-10-CM | POA: Diagnosis not present

## 2019-02-04 DIAGNOSIS — Z1231 Encounter for screening mammogram for malignant neoplasm of breast: Secondary | ICD-10-CM

## 2019-02-04 DIAGNOSIS — N951 Menopausal and female climacteric states: Secondary | ICD-10-CM

## 2019-02-04 NOTE — Progress Notes (Signed)
PCP:  Burnard Hawthorne, FNP   Chief Complaint  Patient presents with  . Gynecologic Exam     HPI:      [name] is a 48 y.o. No obstetric history on file. who LMP was Patient's last menstrual period was 01/13/2019 (exact date)., presents today for her annual examination.  Her menses are Q3-6 months with OCPs, lasting 1 day, very light.  Dysmenorrhea none. She does not have intermenstrual bleeding.  Sex activity: never Last Pap: 01/29/18  Results were: no abnormalities /neg HPV DNA 2018. Likes yearly paps but not indicated/covered by insurance. Hx of STDs: none  Last mammogram: 02/24/18  Results were: normal--routine follow-up in 12 months. Has mammo sched 03/03/19 There is a FH of breast cancer in her mat cousin, genetic testing not indicated for pt. There is no FH of ovarian cancer. The patient does not do self-breast exams.  Tobacco use: The patient denies current or previous tobacco use. Alcohol use: none No drug use.  Exercise: moderately active  She does get adequate calcium and Vitamin D in her diet.  Pt had colonoscopy in 2016 with Dr. Vira Agar with polyps/hemorrhoids. Repeat due in 5 yrs due to [place] colon cancer in her dad. Doesn't qualify for cancer genetic testing.  Labs with PCP 2019   Past Medical History:  Diagnosis Date  . Anxiety   . Blood in stool   . Cataract   . Chicken pox   . Endometriosis of uterus 05/23/1993   LAP  . Genital warts   . Kidney disease    per pt medical screening form  . Kidney stone   . Motion sickness    cars  . PONV (postoperative nausea and vomiting)    nausea only  . Retinal detachment   . Sciatica of left side     Past Surgical History:  Procedure Laterality Date  . CATARACT EXTRACTION W/PHACO Left 06/26/2017   Procedure: CATARACT EXTRACTION PHACO AND INTRAOCULAR LENS PLACEMENT ([facility]) LEFT;  Surgeon: Leandrew Koyanagi, MD;  Location: [facility];  Service: Ophthalmology;  Laterality: Left;  .  COLONOSCOPY N/A 08/25/2014   Procedure: COLONOSCOPY;  Surgeon: Manya Silvas, MD;  Location: [facility] ENDOSCOPY;  Service: Endoscopy;  Laterality: N/A;  . endoscopy    . GUM SURGERY  2008  . laproscopy      Family History  Problem Relation Age of Onset  . Colon cancer Father 47  . Arthritis Father   . Cancer Father        colon/prostate  . Hypertension Father   . Arthritis Mother   . Hyperlipidemia Mother   . Heart disease Mother   . Stroke Mother   . Hypertension Mother   . Kidney disease Mother   . Diabetes Mother        Type 1  . Congestive Heart Failure Mother   . Stroke Maternal Grandmother   . Cancer Maternal Grandfather        prostate  . Stroke Paternal Grandfather   . Breast cancer Cousin 62       has contact    Social History   Socioeconomic History  . Marital status: Single    Spouse name: Not on file  . Number of children: Not on file  . Years of education: Not on file  . Highest education level: Not on file  Occupational History  . Not on file  Social Needs  . Financial resource strain: Not on file  . Food insecurity  Worry: Not on file    Inability: Not on file  . Transportation needs    Medical: Not on file    Non-medical: Not on file  Tobacco Use  . Smoking status: Never Smoker  . Smokeless tobacco: Never Used  Substance and Sexual Activity  . Alcohol use: No    Alcohol/week: 0.0 standard drinks  . Drug use: No  . Sexual activity: Never  Lifestyle  . Physical activity    Days per week: Not on file    Minutes per session: Not on file  . Stress: Not on file  Relationships  . Social Herbalist on phone: Not on file    Gets together: Not on file    Attends religious service: Not on file    Active member of club or organization: Not on file    Attends meetings of clubs or organizations: Not on file    Relationship status: Not on file  . Intimate partner violence    Fear of current or ex partner: Not on file    Emotionally  abused: Not on file    Physically abused: Not on file    Forced sexual activity: Not on file  Other Topics Concern  . Not on file  Social History Narrative   Lives with twin sister   Work- [employer]    No pets    No children    Right handed    No caffeine daily- tea occasionally; eats chocolate    Enjoys- shopping and eating out     Current Meds  Medication Sig  . Calcium Carbonate-Vitamin D (CALCIUM 600+D PO) Take by mouth daily.  Marland Kitchen escitalopram (LEXAPRO) 10 MG tablet Take 1 tablet by mouth once daily  . JOLESSA 0.15-0.03 MG tablet Take 1 tablet by mouth daily.  Marland Kitchen ketorolac (ACULAR) 0.4 % SOLN Place 1 drop into the left eye 2 (two) times daily.  . SYSTANE 0.4-0.3 % SOLN      ROS:  Review of Systems  Constitutional: Negative for fatigue, fever and unexpected weight change.  Respiratory: Negative for cough, shortness of breath and wheezing.   Cardiovascular: Negative for chest pain, palpitations and leg swelling.  Gastrointestinal: Negative for blood in stool, constipation, diarrhea, nausea and vomiting.  Endocrine: Negative for cold intolerance, heat intolerance and polyuria.  Genitourinary: Negative for dyspareunia, dysuria, flank pain, frequency, genital sores, hematuria, menstrual problem, pelvic pain, urgency, vaginal bleeding, vaginal discharge and vaginal pain.  Musculoskeletal: Negative for back pain, joint swelling and myalgias.  Skin: Negative for rash.  Neurological: Negative for dizziness, syncope, light-headedness, numbness and headaches.  Hematological: Negative for adenopathy.  Psychiatric/Behavioral: Positive for agitation. Negative for confusion, sleep disturbance and suicidal ideas. The patient is not nervous/anxious.      Objective: BP 90/70   Ht 5' 8.5" (1.74 m)   Wt 160 lb (72.6 kg)   LMP 01/13/2019 (Exact Date)   BMI 23.97 kg/m    Physical Exam Constitutional:      Appearance: She is well-developed.  Genitourinary:      Vulva, vagina, uterus, right adnexa and left adnexa normal.     No vulval lesion or tenderness noted.     No vaginal discharge, erythema or tenderness.     No cervical motion tenderness or polyp.     Uterus is not enlarged or tender.     No right or left adnexal mass present.     Right adnexa not tender.  Left adnexa not tender.  Neck:     Musculoskeletal: Normal range of motion.     Thyroid: No thyromegaly.  Cardiovascular:     Rate and Rhythm: Normal rate and regular rhythm.     Heart sounds: Normal heart sounds. No murmur.  Pulmonary:     Effort: Pulmonary effort is normal.     Breath sounds: Normal breath sounds.  Chest:     Breasts:        Right: No mass, nipple discharge, skin change or tenderness.        Left: No mass, nipple discharge, skin change or tenderness.  Abdominal:     Palpations: Abdomen is soft.     Tenderness: There is no abdominal tenderness. There is no guarding.  Musculoskeletal: Normal range of motion.  Neurological:     General: No focal deficit present.     Mental Status: She is alert and oriented to person, place, and time.     Cranial Nerves: No cranial nerve deficit.  Skin:    General: Skin is warm and dry.  Psychiatric:        Mood and Affect: Mood normal.        Behavior: Behavior normal.        Thought Content: Thought content normal.        Judgment: Judgment normal.  Vitals signs reviewed.     Assessment/Plan: Encounter for annual routine gynecological examination  Encounter for screening mammogram for malignant neoplasm of breast; pt has mammo sched  Perimenopause--given age/light flow, will d/c OCPs to see if menopausal. Can restart OCPs if needed. F/u prn.             GYN counsel mammography screening, adequate intake of calcium and vitamin D, diet and exercise     F/U  Return in about 1 year (around 02/04/2020).  Evadene Wardrip B. Karra Pink, PA-C 02/04/2019 4:33 PM

## 2019-02-04 NOTE — Patient Instructions (Signed)
I value your feedback and entrusting us with your care. If you get a [facility] patient survey, I would appreciate you taking the time to let us know about your experience today. Thank you! 

## 2019-02-09 ENCOUNTER — Ambulatory Visit (INDEPENDENT_AMBULATORY_CARE_PROVIDER_SITE_OTHER): Payer: BC Managed Care – PPO | Admitting: Vascular Surgery

## 2019-02-10 NOTE — Progress Notes (Deleted)
MRN : [id number]  Margaret Mcfarland is a 48 y.o. (1970/08/11) female who presents with chief complaint of No chief complaint on file. Marland Kitchen  History of Present Illness:   The patient returns to the office for followup evaluation regarding leg swelling.  The swelling has persisted but with the lymph pump is much, much better controlled. The pain associated with swelling is essentially eliminated. There have not been any interval development of a ulcerations or wounds.  The patient denies problems with the pump, noting it is working well and the leggings are in good condition.  Since the previous visit the patient has been wearing graduated compression stockings and using the lymph pump on a routine basis and  has noted significant improvement in the lymphedema.   Patient stated the lymph pump has been a very positive factor in her care.    No outpatient medications have been marked as taking for the 02/12/19 encounter (Appointment) with Delana Meyer, Dolores Lory, MD.    Past Medical History:  Diagnosis Date  . Anxiety   . Blood in stool   . Cataract   . Chicken pox   . Endometriosis of uterus 05/23/1993   LAP  . Genital warts   . Kidney disease    per pt medical screening form  . Kidney stone   . Motion sickness    cars  . PONV (postoperative nausea and vomiting)    nausea only  . Retinal detachment   . Sciatica of left side     Past Surgical History:  Procedure Laterality Date  . CATARACT EXTRACTION W/PHACO Left 06/26/2017   Procedure: CATARACT EXTRACTION PHACO AND INTRAOCULAR LENS PLACEMENT ([facility]) LEFT;  Surgeon: Leandrew Koyanagi, MD;  Location: [facility];  Service: Ophthalmology;  Laterality: Left;  . COLONOSCOPY N/A 08/25/2014   Procedure: COLONOSCOPY;  Surgeon: Manya Silvas, MD;  [facility]COPY;  Service: Endoscopy;  Laterality: N/A;  . endoscopy    . GUM SURGERY  2008  . laproscopy      Social History Social History   Tobacco Use  . Smoking  status: Never Smoker  . Smokeless tobacco: Never Used  Substance Use Topics  . Alcohol use: No    Alcohol/week: 0.0 standard drinks  . Drug use: No    Family History Family History  Problem Relation Age of Onset  . Colon cancer Father 34  . Arthritis Father   . Cancer Father        colon/prostate  . Hypertension Father   . Arthritis Mother   . Hyperlipidemia Mother   . Heart disease Mother   . Stroke Mother   . Hypertension Mother   . Kidney disease Mother   . Diabetes Mother        Type 1  . Congestive Heart Failure Mother   . Stroke Maternal Grandmother   . Cancer Maternal Grandfather        prostate  . Stroke Paternal Grandfather   . Breast cancer Cousin 35       has contact    Allergies  Allergen Reactions  . Sertraline Diarrhea     REVIEW OF SYSTEMS (Negative unless checked)  Constitutional: [] Weight loss  [] Fever  [] Chills Cardiac: [] Chest pain   [] Chest pressure   [] Palpitations   [] Shortness of breath when laying flat   [] Shortness of breath with exertion. Vascular:  [] Pain in legs with walking   [] Pain in legs at rest  [] History of DVT   [] Phlebitis   [x] Swelling  in legs   [] Varicose veins   [] Non-healing ulcers Pulmonary:   [] Uses home oxygen   [] Productive cough   [] Hemoptysis   [] Wheeze  [] COPD   [] Asthma Neurologic:  [] Dizziness   [] Seizures   [] History of stroke   [] History of TIA  [] Aphasia   [] Vissual changes   [] Weakness or numbness in arm   [] Weakness or numbness in leg Musculoskeletal:   [] Joint swelling   [] Joint pain   [] Low back pain Hematologic:  [] Easy bruising  [] Easy bleeding   [] Hypercoagulable state   [] Anemic Gastrointestinal:  [] Diarrhea   [] Vomiting  [] Gastroesophageal reflux/heartburn   [] Difficulty swallowing. Genitourinary:  [] Chronic kidney disease   [] Difficult urination  [] Frequent urination   [] Blood in urine Skin:  [] Rashes   [] Ulcers  Psychological:  [] History of anxiety   []  History of major depression.  Physical  Examination  There were no vitals filed for this visit. There is no height or weight on file to calculate BMI. Gen: WD/WN, NAD Head: [employer]/AT, No temporalis wasting.  Ear/Nose/Throat: Hearing grossly intact, nares w/o erythema or drainage Eyes: PER, EOMI, sclera nonicteric.  Neck: Supple, no large masses.   Pulmonary:  Good air movement, no audible wheezing bilaterally, no use of accessory muscles.  Cardiac: RRR, no JVD Vascular: scattered varicosities present bilaterally.  Mild venous stasis changes to the legs bilaterally.  2+ soft pitting edema Vessel Right Left  Radial Palpable Palpable  PT Palpable Palpable  DP Palpable Palpable  Gastrointestinal: Non-distended. No guarding/no peritoneal signs.  Musculoskeletal: M/S 5/5 throughout.  No deformity or atrophy.  Neurologic: CN 2-12 intact. Symmetrical.  Speech is fluent. Motor exam as listed above. Psychiatric: Judgment intact, Mood & affect appropriate for pt's clinical situation. Dermatologic: No rashes or ulcers noted.  No changes consistent with cellulitis. Lymph : No lichenification or skin changes of chronic lymphedema.  CBC Lab Results  Component Value Date   WBC 6.3 01/08/2018   HGB 14.0 01/08/2018   HCT 42.1 01/08/2018   MCV 91.3 01/08/2018   PLT 252.0 01/08/2018    BMET    Component Value Date/Time   NA 136 01/08/2018 1546   K 4.3 01/08/2018 1546   CL 103 01/08/2018 1546   CO2 28 01/08/2018 1546   GLUCOSE 83 01/08/2018 1546   BUN 14 01/08/2018 1546   CREATININE 0.75 01/08/2018 1546   CREATININE 0.73 07/01/2015 1527   CALCIUM 9.4 01/08/2018 1546   GFRNONAA >60 05/30/2017 1840   GFRAA >60 05/30/2017 1840   CrCl cannot be calculated (Patient's most recent lab result is older than the maximum 21 days allowed.).  COAG Lab Results  Component Value Date   INR 1.05 07/01/2015    Radiology No results found.   Assessment/Plan 1. Lymphedema ***  2. Chronic venous insufficiency ***  Hortencia Pilar, MD   02/10/2019 12:50 PM

## 2019-02-12 ENCOUNTER — Ambulatory Visit (INDEPENDENT_AMBULATORY_CARE_PROVIDER_SITE_OTHER): Payer: BC Managed Care – PPO | Admitting: Vascular Surgery

## 2019-03-03 ENCOUNTER — Ambulatory Visit
Admission: RE | Admit: 2019-03-03 | Discharge: 2019-03-03 | Disposition: A | Discharge status: Home / Self Care | Payer: BC Managed Care – PPO | Source: Ambulatory Visit | Attending: Obstetrics and Gynecology | Admitting: Obstetrics and Gynecology

## 2019-03-03 ENCOUNTER — Encounter: Payer: Self-pay | Admitting: Obstetrics and Gynecology

## 2019-03-03 DIAGNOSIS — Z1231 Encounter for screening mammogram for malignant neoplasm of breast: Secondary | ICD-10-CM | POA: Diagnosis not present

## 2019-03-09 ENCOUNTER — Other Ambulatory Visit: Payer: Self-pay | Admitting: Family

## 2019-03-09 ENCOUNTER — Encounter (INDEPENDENT_AMBULATORY_CARE_PROVIDER_SITE_OTHER): Payer: Self-pay | Admitting: Vascular Surgery

## 2019-03-09 ENCOUNTER — Ambulatory Visit (INDEPENDENT_AMBULATORY_CARE_PROVIDER_SITE_OTHER): Payer: BC Managed Care – PPO | Admitting: Vascular Surgery

## 2019-03-09 ENCOUNTER — Other Ambulatory Visit: Payer: Self-pay

## 2019-03-09 VITALS — BP 122/75 | HR 76 | Resp 17 | Ht 69.0 in | Wt 161.0 lb

## 2019-03-09 DIAGNOSIS — I872 Venous insufficiency (chronic) (peripheral): Secondary | ICD-10-CM

## 2019-03-09 DIAGNOSIS — I890 Lymphedema, not elsewhere classified: Secondary | ICD-10-CM

## 2019-03-09 DIAGNOSIS — F419 Anxiety disorder, unspecified: Secondary | ICD-10-CM

## 2019-03-09 NOTE — Progress Notes (Signed)
MRN : [id number]  Margaret Mcfarland is a 49 y.o. (02-02-1971) female who presents with chief complaint of  Chief Complaint  Patient presents with  . Follow-up    ultrasound  .  History of Present Illness:    The patient returns to the office for followup evaluation regarding leg swelling.  The swelling has persisted but with the lymph pump the patient states the swelling is much better controlled. The pain associated with swelling is essentially eliminated. There have not been any interval development of a ulcerations or wounds.  No episodes of cellulitis or infection over the past 12 months  The patient denies problems with the pump, noting it is working well and the leggings are in good condition.  Since the previous visit the patient has been wearing graduated compression stockings and using the lymph pump 2 times per day on a routine basis and  has noted significant improvement in the lymphedema.   Patient stated the lymph pump has been a very positive factor in her care.     Current Meds  Medication Sig  . Calcium Carbonate-Vitamin D (CALCIUM 600+D PO) Take by mouth daily.  Marland Kitchen escitalopram (LEXAPRO) 10 MG tablet Take 1 tablet by mouth once daily  . JOLESSA 0.15-0.03 MG tablet Take 1 tablet by mouth daily.  Marland Kitchen ketorolac (ACULAR) 0.4 % SOLN Place 1 drop into the left eye 2 (two) times daily.  . SYSTANE 0.4-0.3 % SOLN     Past Medical History:  Diagnosis Date  . Anxiety   . Blood in stool   . Cataract   . Chicken pox   . Endometriosis of uterus 05/23/1993   LAP  . Genital warts   . Kidney disease    per pt medical screening form  . Kidney stone   . Motion sickness    cars  . PONV (postoperative nausea and vomiting)    nausea only  . Retinal detachment   . Sciatica of left side     Past Surgical History:  Procedure Laterality Date  . CATARACT EXTRACTION W/PHACO Left 06/26/2017   Procedure: CATARACT EXTRACTION PHACO AND INTRAOCULAR LENS PLACEMENT ([place]) LEFT;  Surgeon:  Leandrew Koyanagi, MD;  Location: [facility];  Service: Ophthalmology;  Laterality: Left;  . COLONOSCOPY N/A 08/25/2014   Procedure: COLONOSCOPY;  Surgeon: Manya Silvas, MD;  Location: [facility] ENDOSCOPY;  Service: Endoscopy;  Laterality: N/A;  . endoscopy    . GUM SURGERY  2008  . laproscopy      Social History Social History   Tobacco Use  . Smoking status: Never Smoker  . Smokeless tobacco: Never Used  Substance Use Topics  . Alcohol use: No    Alcohol/week: 0.0 standard drinks  . Drug use: No    Family History Family History  Problem Relation Age of Onset  . Colon cancer Father 37  . Arthritis Father   . Cancer Father        colon/prostate  . Hypertension Father   . Arthritis Mother   . Hyperlipidemia Mother   . Heart disease Mother   . Stroke Mother   . Hypertension Mother   . Kidney disease Mother   . Diabetes Mother        Type 1  . Congestive Heart Failure Mother   . Stroke Maternal Grandmother   . Cancer Maternal Grandfather        prostate  . Stroke Paternal Grandfather   . Breast cancer Cousin 35  has contact    Allergies  Allergen Reactions  . Sertraline Diarrhea     REVIEW OF SYSTEMS (Negative unless checked)  Constitutional: [] Weight loss  [] Fever  [] Chills Cardiac: [] Chest pain   [] Chest pressure   [] Palpitations   [] Shortness of breath when laying flat   [] Shortness of breath with exertion. Vascular:  [] Pain in legs with walking   [x] Pain in legs at rest  [] History of DVT   [] Phlebitis   [x] Swelling in legs   [] Varicose veins   [] Non-healing ulcers Pulmonary:   [] Uses home oxygen   [] Productive cough   [] Hemoptysis   [] Wheeze  [] COPD   [] Asthma Neurologic:  [] Dizziness   [] Seizures   [] History of stroke   [] History of TIA  [] Aphasia   [] Vissual changes   [] Weakness or numbness in arm   [] Weakness or numbness in leg Musculoskeletal:   [] Joint swelling   [] Joint pain   [] Low back pain Hematologic:  [] Easy bruising  [] Easy  bleeding   [] Hypercoagulable state   [] Anemic Gastrointestinal:  [] Diarrhea   [] Vomiting  [] Gastroesophageal reflux/heartburn   [] Difficulty swallowing. Genitourinary:  [] Chronic kidney disease   [] Difficult urination  [] Frequent urination   [] Blood in urine Skin:  [] Rashes   [] Ulcers  Psychological:  [] History of anxiety   []  History of major depression.  Physical Examination  Vitals:   03/09/19 1356  BP: 122/75  Pulse: 76  Resp: 17  Weight: 161 lb (73 kg)  Height: 5\' 9"  (1.753 m)   Body mass index is 23.78 kg/m. Gen: WD/WN, NAD Head: [place]/AT, No temporalis wasting.  Ear/Nose/Throat: Hearing grossly intact, nares w/o erythema or drainage Eyes: PER, EOMI, sclera nonicteric.  Neck: Supple, no large masses.   Pulmonary:  Good air movement, no audible wheezing bilaterally, no use of accessory muscles.  Cardiac: RRR, no JVD Vascular: 1+ edema bilaterally Gastrointestinal: Non-distended. No guarding/no peritoneal signs.  Musculoskeletal: M/S 5/5 throughout.  No deformity or atrophy.  Neurologic: CN 2-12 intact. Symmetrical.  Speech is fluent. Motor exam as listed above. Psychiatric: Judgment intact, Mood & affect appropriate for pt's clinical situation. Dermatologic: No rashes or ulcers noted.  No changes consistent with cellulitis. Lymph : No lichenification or skin changes of chronic lymphedema.  CBC Lab Results  Component Value Date   WBC 6.3 01/08/2018   HGB 14.0 01/08/2018   HCT 42.1 01/08/2018   MCV 91.3 01/08/2018   PLT 252.0 01/08/2018    BMET    Component Value Date/Time   NA 136 01/08/2018 1546   K 4.3 01/08/2018 1546   CL 103 01/08/2018 1546   CO2 28 01/08/2018 1546   GLUCOSE 83 01/08/2018 1546   BUN 14 01/08/2018 1546   CREATININE 0.75 01/08/2018 1546   CREATININE 0.73 07/01/2015 1527   CALCIUM 9.4 01/08/2018 1546   GFRNONAA >60 05/30/2017 1840   GFRAA >60 05/30/2017 1840   CrCl cannot be calculated (Patient's most recent lab result is older than the  maximum 21 days allowed.).  COAG Lab Results  Component Value Date   INR 1.05 07/01/2015    Radiology MM 3D SCREEN BREAST BILATERAL  Result Date: 03/03/2019 CLINICAL DATA:  Screening. EXAM: DIGITAL SCREENING BILATERAL MAMMOGRAM WITH TOMO AND CAD COMPARISON:  Previous exam(s). ACR Breast Density Category c: The breast tissue is heterogeneously dense, which may obscure small masses. FINDINGS: There are no findings suspicious for malignancy. Images were processed with CAD. IMPRESSION: No mammographic evidence of malignancy. A result letter of this screening mammogram will be mailed directly to  the patient. RECOMMENDATION: Screening mammogram in one year. (Code:SM-B-01Y) BI-RADS CATEGORY  1: Negative. Electronically Signed   By: Franki Cabot M.D.   On: 03/03/2019 15:32    Assessment/Plan 1. Lymphedema  No surgery or intervention at this point in time.    I have reviewed my discussion with the patient regarding lymphedema and why it  causes symptoms.  Patient will continue wearing graduated compression stockings class 1 (20-30 mmHg) on a daily basis a prescription was given. The patient is reminded to put the stockings on first thing in the morning and removing them in the evening. The patient is instructed specifically not to sleep in the stockings.   In addition, behavioral modification throughout the day will be continued.  This will include frequent elevation (such as in a recliner), use of over the counter pain medications as needed and exercise such as walking.  I have reviewed systemic causes for chronic edema such as liver, kidney and cardiac etiologies and there does not appear to be any significant changes in these organ systems over the past year.  The patient is under the impression that these organ systems are all stable and unchanged.    The patient will continue aggressive use of the  lymph pump.  This will continue to improve the edema control and prevent sequela such as ulcers  and infections.   The patient will follow-up with me on an annual basis.    2. Chronic venous insufficiency  No surgery or intervention at this point in time.    I have reviewed my discussion with the patient regarding lymphedema and why it  causes symptoms.  Patient will continue wearing graduated compression stockings class 1 (20-30 mmHg) on a daily basis a prescription was given. The patient is reminded to put the stockings on first thing in the morning and removing them in the evening. The patient is instructed specifically not to sleep in the stockings.   In addition, behavioral modification throughout the day will be continued.  This will include frequent elevation (such as in a recliner), use of over the counter pain medications as needed and exercise such as walking.  I have reviewed systemic causes for chronic edema such as liver, kidney and cardiac etiologies and there does not appear to be any significant changes in these organ systems over the past year.  The patient is under the impression that these organ systems are all stable and unchanged.    The patient will continue aggressive use of the  lymph pump.  This will continue to improve the edema control and prevent sequela such as ulcers and infections.   The patient will follow-up with me on an annual basis.      Hortencia Pilar, MD  03/09/2019 1:58 PM

## 2019-06-08 ENCOUNTER — Other Ambulatory Visit: Payer: Self-pay | Admitting: Family

## 2019-06-08 ENCOUNTER — Telehealth (INDEPENDENT_AMBULATORY_CARE_PROVIDER_SITE_OTHER): Payer: Self-pay | Admitting: Vascular Surgery

## 2019-06-08 DIAGNOSIS — F419 Anxiety disorder, unspecified: Secondary | ICD-10-CM

## 2019-06-08 NOTE — Telephone Encounter (Signed)
I spoke with Dr Delana Meyer and he advise for the patient to come in with left dvt ultrasound and see him on thursday

## 2019-06-08 NOTE — Telephone Encounter (Signed)
Called stating left foot and leg is hurting and toes are swollen. Went to [facility] and they told her to see if she can get an appt asap. Please advise.

## 2019-06-11 ENCOUNTER — Encounter (INDEPENDENT_AMBULATORY_CARE_PROVIDER_SITE_OTHER): Payer: BC Managed Care – PPO

## 2019-06-17 ENCOUNTER — Other Ambulatory Visit (INDEPENDENT_AMBULATORY_CARE_PROVIDER_SITE_OTHER): Payer: Self-pay | Admitting: Vascular Surgery

## 2019-06-17 DIAGNOSIS — M79672 Pain in left foot: Secondary | ICD-10-CM

## 2019-06-17 DIAGNOSIS — M79605 Pain in left leg: Secondary | ICD-10-CM

## 2019-06-17 DIAGNOSIS — M7989 Other specified soft tissue disorders: Secondary | ICD-10-CM

## 2019-06-22 ENCOUNTER — Ambulatory Visit (INDEPENDENT_AMBULATORY_CARE_PROVIDER_SITE_OTHER): Payer: BC Managed Care – PPO | Admitting: Nurse Practitioner

## 2019-06-22 ENCOUNTER — Encounter (INDEPENDENT_AMBULATORY_CARE_PROVIDER_SITE_OTHER): Payer: Self-pay | Admitting: Nurse Practitioner

## 2019-06-22 ENCOUNTER — Other Ambulatory Visit: Payer: Self-pay

## 2019-06-22 ENCOUNTER — Ambulatory Visit (INDEPENDENT_AMBULATORY_CARE_PROVIDER_SITE_OTHER): Payer: BC Managed Care – PPO

## 2019-06-22 VITALS — BP 121/79 | HR 67 | Resp 16 | Wt 160.0 lb

## 2019-06-22 DIAGNOSIS — M7989 Other specified soft tissue disorders: Secondary | ICD-10-CM

## 2019-06-22 DIAGNOSIS — M79605 Pain in left leg: Secondary | ICD-10-CM

## 2019-06-22 DIAGNOSIS — M79606 Pain in leg, unspecified: Secondary | ICD-10-CM | POA: Diagnosis not present

## 2019-06-22 DIAGNOSIS — M79672 Pain in left foot: Secondary | ICD-10-CM | POA: Diagnosis not present

## 2019-06-23 ENCOUNTER — Encounter (INDEPENDENT_AMBULATORY_CARE_PROVIDER_SITE_OTHER): Payer: Self-pay | Admitting: Nurse Practitioner

## 2019-06-23 NOTE — Progress Notes (Signed)
Subjective:    Patient ID: Margaret Mcfarland, female    DOB: 12-Jun-1970, 49 y.o.   MRN: [id number] Chief Complaint  Patient presents with  . Follow-up    ultrasound follow up    Patient presents today with concerns for left lower extremity swelling.  Initially our office was contacted with concern for DVT due to some swelling of the foot and toes however the patient states that her foot was not swollen it just felt as if it was.  This sensation has since passed.  She denies any symptoms swelling or redness of her lower extremities.  The patient does endorse having pain in her feet started from the bottom of her feet in shoes.  She denies any true claudication-like symptoms or rest pain like symptoms.  Patient does endorse wearing her medical grade 1 compression stockings on a daily basis with elevation.  The patient also notes that she has plantar fasciitis.  Reviewing previous medical records also reveals that the patient has neuritis of her foot.  Today noninvasive study showed no evidence of DVT in the left lower extremity there is no evidence of superficial venous thrombosis.  There is no evidence of DVT in the right lower extremity.   Review of Systems  Musculoskeletal:       Foot pain  All other systems reviewed and are negative.      Objective:   Physical Exam Vitals reviewed.  HENT:     Head: Normocephalic.  Cardiovascular:     Rate and Rhythm: Normal rate and regular rhythm.  Pulmonary:     Effort: Pulmonary effort is normal.     Breath sounds: Normal breath sounds.  Musculoskeletal:        General: No swelling.  Neurological:     Mental Status: She is alert and oriented to person, place, and time.  Psychiatric:        Mood and Affect: Mood normal.        Behavior: Behavior normal.        Thought Content: Thought content normal.        Judgment: Judgment normal.     BP 121/79 (BP Location: Right Arm)   Pulse 67   Resp 16   Wt 160 lb (72.6 kg)   BMI 23.63 kg/m    Past Medical History:  Diagnosis Date  . Anxiety   . Blood in stool   . Cataract   . Chicken pox   . Endometriosis of uterus 05/23/1993   LAP  . Genital warts   . Kidney disease    per pt medical screening form  . Kidney stone   . Motion sickness    cars  . PONV (postoperative nausea and vomiting)    nausea only  . Retinal detachment   . Sciatica of left side     Social History   Socioeconomic History  . Marital status: Single    Spouse name: Not on file  . Number of children: Not on file  . Years of education: Not on file  . Highest education level: Not on file  Occupational History  . Not on file  Tobacco Use  . Smoking status: Never Smoker  . Smokeless tobacco: Never Used  Substance and Sexual Activity  . Alcohol use: No    Alcohol/week: 0.0 standard drinks  . Drug use: No  . Sexual activity: Never  Other Topics Concern  . Not on file  Social History Narrative   Lives with twin sister  Work- [employer]    No pets    No children    Right handed    No caffeine daily- tea occasionally; eats chocolate    Enjoys- shopping and eating out    Social Determinants of Radio broadcast assistant Strain:   . Difficulty of Paying Living Expenses:   Food Insecurity:   . Worried About Charity fundraiser in the Last Year:   . Arboriculturist in the Last Year:   Transportation Needs:   . Film/video editor (Medical):   Marland Kitchen Lack of Transportation (Non-Medical):   Physical Activity:   . Days of Exercise per Week:   . Minutes of Exercise per Session:   Stress:   . Feeling of Stress :   Social Connections:   . Frequency of Communication with Friends and Family:   . Frequency of Social Gatherings with Friends and Family:   . Attends Religious Services:   . Active Member of Clubs or Organizations:   . Attends Archivist Meetings:   Marland Kitchen Marital Status:   Intimate Partner Violence:   . Fear of Current or Ex-Partner:   .  Emotionally Abused:   Marland Kitchen Physically Abused:   . Sexually Abused:     Past Surgical History:  Procedure Laterality Date  . CATARACT EXTRACTION W/PHACO Left 06/26/2017   Procedure: CATARACT EXTRACTION PHACO AND INTRAOCULAR LENS PLACEMENT ([place]) LEFT;  Surgeon: Leandrew Koyanagi, MD;  Location: [place];  Service: Ophthalmology;  Laterality: Left;  . COLONOSCOPY N/A 08/25/2014   Procedure: COLONOSCOPY;  Surgeon: Manya Silvas, MD;  Location: [facility] ENDOSCOPY;  Service: Endoscopy;  Laterality: N/A;  . endoscopy    . GUM SURGERY  2008  . laproscopy      Family History  Problem Relation Age of Onset  . Colon cancer Father 64  . Arthritis Father   . Cancer Father        colon/prostate  . Hypertension Father   . Arthritis Mother   . Hyperlipidemia Mother   . Heart disease Mother   . Stroke Mother   . Hypertension Mother   . Kidney disease Mother   . Diabetes Mother        Type 1  . Congestive Heart Failure Mother   . Stroke Maternal Grandmother   . Cancer Maternal Grandfather        prostate  . Stroke Paternal Grandfather   . Breast cancer Cousin 35       has contact    Allergies  Allergen Reactions  . Sertraline Diarrhea       Assessment & Plan:   1. Leg swelling No surgery or intervention at this point in time.  I have reviewed my discussion with the patient regarding venous insufficiency and why it causes symptoms. I have discussed with the patient the chronic skin changes that accompany venous insufficiency and the long term sequela such as ulceration. Patient will contnue wearing graduated compression stockings on a daily basis, as this has provided excellent control of his edema. The patient will put the stockings on first thing in the morning and removing them in the evening. The patient is reminded not to sleep in the stockings.  In addition, behavioral modification including elevation during the day will be initiated. Exercise is strongly  encouraged.    2. Pain of lower extremity, unspecified laterality The patient is concerned that her pain could be related to a vascular cause.  Based upon previous  venous studies as well as description of symptoms likely not related to chronic venous insufficiency however not yet performed arterial studies on the patient.  Her symptoms are not necessarily consistent with arterial disease however given the patient's concern it would be prudent to rule it out.  If vascular studies are unrevealing, we will defer back to podiatry for further treatment of her neuritis.   Current Outpatient Medications on File Prior to Visit  Medication Sig Dispense Refill  . Calcium Carbonate-Vitamin D (CALCIUM 600+D PO) Take by mouth daily.    Marland Kitchen escitalopram (LEXAPRO) 10 MG tablet Take 1 tablet by mouth once daily 90 tablet 0  . ketorolac (ACULAR) 0.4 % SOLN Place 1 drop into the left eye 2 (two) times daily.    . SYSTANE 0.4-0.3 % SOLN     . JOLESSA 0.15-0.03 MG tablet Take 1 tablet by mouth daily.     No current facility-administered medications on file prior to visit.    There are no Patient Instructions on file for this visit. No follow-ups on file.   Kris Hartmann, NP

## 2019-06-29 ENCOUNTER — Other Ambulatory Visit: Payer: Self-pay

## 2019-06-29 ENCOUNTER — Ambulatory Visit (INDEPENDENT_AMBULATORY_CARE_PROVIDER_SITE_OTHER): Payer: BC Managed Care – PPO | Admitting: Dermatology

## 2019-06-29 DIAGNOSIS — D229 Melanocytic nevi, unspecified: Secondary | ICD-10-CM | POA: Diagnosis not present

## 2019-06-29 DIAGNOSIS — L811 Chloasma: Secondary | ICD-10-CM

## 2019-06-29 DIAGNOSIS — B079 Viral wart, unspecified: Secondary | ICD-10-CM | POA: Diagnosis not present

## 2019-06-29 DIAGNOSIS — R21 Rash and other nonspecific skin eruption: Secondary | ICD-10-CM

## 2019-06-29 DIAGNOSIS — D1800 Hemangioma unspecified site: Secondary | ICD-10-CM

## 2019-06-29 MED ORDER — HYDROCORTISONE 2.5 % EX CREA: TOPICAL_CREAM | Freq: Two times a day (BID) | CUTANEOUS | 2 refills | Status: DC | PRN

## 2019-06-29 MED ORDER — HYDROQUINONE 4 % EX CREA: TOPICAL_CREAM | CUTANEOUS | 2 refills | Status: DC

## 2019-06-29 NOTE — Patient Instructions (Addendum)
Recommend daily broad spectrum sunscreen SPF 30+ to sun-exposed areas, reapply every 2 hours as needed. Call for new or changing lesions.  Gentle Skin Care Guide  1. Bathe no more than once a day.  2. Avoid bathing in hot water  3. Use a mild soap like Dove, Vanicream, Cetaphil, CeraVe. Can use Lever 2000 or Cetaphil antibacterial soap  4. Use soap only where you need it. On most days, use it under your arms, between your legs, and on your feet. Let the water rinse other areas unless visibly dirty.  5. When you get out of the bath/shower, use a towel to gently blot your skin dry, don't rub it.  6. While your skin is still a little damp, apply a moisturizing cream such as Vanicream, CeraVe, Cetaphil, Eucerin, Sarna lotion or plain Vaseline Jelly. For hands apply Neutrogena [place] Hand Cream or Excipial Hand Cream.  7. Reapply moisturizer any time you start to itch or feel dry.  8. Sometimes using free and clear laundry detergents can be helpful. Fabric softener sheets should be avoided. Downy Free & Gentle liquid, or any liquid fabric softener that is free of dyes and perfumes, it acceptable to use  9. If your doctor has given you prescription creams you may apply moisturizers over them   Cryotherapy Aftercare  . Wash gently with soap and water everyday.   Marland Kitchen Apply Vaseline and Band-Aid daily until healed.  Hydroquinone is for areas at face to use nightly for 3 months.   Hydrocortisone cream is for areas of rash at knees. May use 1-2 times daily for 2 weeks or until clear.

## 2019-06-29 NOTE — Progress Notes (Signed)
   Follow-Up Visit   Subjective  Margaret Mcfarland is a 49 y.o. female who presents for the following: Rash (knees) and Warts.  Patient is here today for rash on knees, left greater then right. Rash present for about 1 month, comes and goes. No itching, no symptoms.  She also has a wart on the right 1st finger. Present for years. It has never been treated and has become sore to touch.  Bump on left forehead, present for at least 1 year. The spot does itch but has not been treated. Also has dark patches   The following portions of the chart were reviewed this encounter and updated as appropriate:     Review of Systems:  No other skin or systemic complaints except as noted in HPI or Assessment and Plan.  Objective  Well appearing patient in no apparent distress; mood and affect are within normal limits.  A focused examination was performed including knees, face, right hand, legs. Relevant physical exam findings are noted in the Assessment and Plan.  Objective  Left Forehead: 67mm firm flesh papule  Objective  Left temple and cheek: Reticulated hyperpigmented patches.   Objective  Knees: Ill-defined light pink patches, focal xerosis  Objective  Right index finger DIP: 11mm verrucous papule -- Discussed viral etiology and contagion.    Assessment & Plan  Fibrous papule of skin Left Forehead  Vs Sebaceous Hyperplasia  Benign-appearing.  Observation.  Discussed shave removal if changing or bothersome  Melasma Left temple and cheek  Discussed chronic condition, worse in summer due to higher UV exposure.  Oral estrogen containing BCPs or supplements can exacerbate condition.  Recommend daily broad spectrum tinted sunscreen SPF 30+ to face, preferably with Zinc or Titanium Dioxide. Discussed Rx topical bleaching creams (i.e. hydroquinone) Recommend The Perfect Bleaching Cream if patient would like to treat. Will first send in 4% hydroquinone cream qhs for 3 months to see if insurance  covers.  May also consider Skin Medicinals fade cream if above too expensive.   hydroquinone 4 % cream - Left temple and cheek  Rash Knees  Mild dermatitis secondary to xerosis Start hydrocortisone 2.5% cream BID x 2 weeks until clear. Recommend mild soap and moisturizing cream 1-2 times daily, especially at knees. Patient will call back if worsening  hydrocortisone 2.5 % cream - Knees  Viral warts, unspecified type Right index finger DIP  Discussed viral etiology and risk of spread.  Discussed multiple treatments may be required to clear warts.  Discussed possible post-treatment dyspigmentation and risk of recurrence. Recommend OTC sal acid product qd once healed  Destruction of lesion - Right index finger DIP  Destruction method: cryotherapy   Informed consent: discussed and consent obtained   Lesion destroyed using liquid nitrogen: Yes   Region frozen until ice ball extended beyond lesion: Yes   Outcome: patient tolerated procedure well with no complications   Post-procedure details: wound care instructions given   Hemangiomas - Red papules abdomen - Discussed benign nature - Observe - Call for any changes  Return if symptoms worsen or fail to improve.  Graciella Belton, RMA, am acting as scribe for Brendolyn Patty, MD .  Documentation: I have reviewed the above documentation for accuracy and completeness, and I agree with the above.  Brendolyn Patty, MD

## 2019-07-07 ENCOUNTER — Telehealth: Payer: Self-pay

## 2019-07-07 NOTE — Telephone Encounter (Signed)
Patient called about the wart that was frozen on her finger. It now has turned blue. She also states this is painful and does not seem like its going to come off with this one treatment, she wants to come in again soon and have this taken care of. Please advise.   She also wanted to come in and see Dr. Nehemiah Massed for a second opinion regarding the rash on her knees. She states the area is smooth and it feels like this is more related to something on "the inside and not related to something external". I asked patient several times if she picked up the Hydrocortisone to start using and she would never answer.

## 2019-07-07 NOTE — Telephone Encounter (Signed)
It can take up to a couple weeks for the wart to come off and heal after cryotherapy.  A blood filled blister is a normal reaction we see after freezing.  Keep it covered with an antibiotic ointment and a bandaid.  We can see her to recheck and possibly retreat if it is still there at one month after treatment date.  If she wants to see Dr. Nehemiah Massed for that visit, that is fine, and he can also recheck her knees, after she has tried the [facility] cream.

## 2019-07-07 NOTE — Telephone Encounter (Signed)
Left message for patient to return my call.

## 2019-07-08 NOTE — Telephone Encounter (Signed)
Left patient second message to return my call.

## 2019-07-10 NOTE — Telephone Encounter (Signed)
Left third and final message today for patient to return my call.

## 2019-07-13 ENCOUNTER — Ambulatory Visit (INDEPENDENT_AMBULATORY_CARE_PROVIDER_SITE_OTHER): Payer: BC Managed Care – PPO | Admitting: Vascular Surgery

## 2019-07-13 ENCOUNTER — Ambulatory Visit (INDEPENDENT_AMBULATORY_CARE_PROVIDER_SITE_OTHER): Payer: BC Managed Care – PPO

## 2019-07-13 ENCOUNTER — Other Ambulatory Visit: Payer: Self-pay

## 2019-07-13 ENCOUNTER — Encounter (INDEPENDENT_AMBULATORY_CARE_PROVIDER_SITE_OTHER): Payer: Self-pay | Admitting: Vascular Surgery

## 2019-07-13 VITALS — BP 131/77 | HR 62 | Resp 16 | Wt 157.8 lb

## 2019-07-13 DIAGNOSIS — I872 Venous insufficiency (chronic) (peripheral): Secondary | ICD-10-CM | POA: Diagnosis not present

## 2019-07-13 DIAGNOSIS — M79606 Pain in leg, unspecified: Secondary | ICD-10-CM | POA: Diagnosis not present

## 2019-07-13 NOTE — Progress Notes (Signed)
MRN : [id number]  Margaret Mcfarland is a 49 y.o. ([birth year]) female who presents with chief complaint of  Chief Complaint  Patient presents with  . Follow-up    ultrasound follow up  .  History of Present Illness:   The patient returns to the office for followup and review of the noninvasive studies. There have been no interval changes in lower extremity symptoms. No interval development of rest pain symptoms. No new ulcers or wounds have occurred since the last visit.  There have been no significant changes to the patient's overall health care.  The patient denies amaurosis fugax or recent TIA symptoms. There are no recent neurological changes noted. The patient denies history of DVT, PE or superficial thrombophlebitis. The patient denies recent episodes of angina or shortness of breath.   ABI Rt=1.11 and Lt=1.16  Triphasic Doppler signals bilaterally   Current Meds  Medication Sig  . Calcium Carbonate-Vitamin D (CALCIUM 600+D PO) Take by mouth daily.  Marland Kitchen escitalopram (LEXAPRO) 10 MG tablet Take 1 tablet by mouth once daily  . hydroquinone 4 % cream Apply to affected areas at bedtime for 3 months.  Thurston Pounds 0.15-0.03 MG tablet Take 1 tablet by mouth daily.  Marland Kitchen ketorolac (ACULAR) 0.4 % SOLN Place 1 drop into the left eye 2 (two) times daily.  . SYSTANE 0.4-0.3 % SOLN     Past Medical History:  Diagnosis Date  . Anxiety   . Blood in stool   . Cataract   . Chicken pox   . Endometriosis of uterus 05/23/1993   LAP  . Genital warts   . Kidney disease    per pt medical screening form  . Kidney stone   . Motion sickness    cars  . PONV (postoperative nausea and vomiting)    nausea only  . Retinal detachment   . Sciatica of left side     Past Surgical History:  Procedure Laterality Date  . CATARACT EXTRACTION W/PHACO Left 06/26/2017   Procedure: CATARACT EXTRACTION PHACO AND INTRAOCULAR LENS PLACEMENT ([place]) LEFT;  Surgeon: Leandrew Koyanagi, MD;  Location: [facility];  Service: Ophthalmology;  Laterality: Left;  . COLONOSCOPY N/A 08/25/2014   Procedure: COLONOSCOPY;  Surgeon: Manya Silvas, MD;  Location: [facility] END[facility]COPY;  Service: Endoscopy;  Laterality: N/A;  . endoscopy    . GUM SURGERY  2008  . laproscopy      Social History Social History   Tobacco Use  . Smoking status: Never Smoker  . Smokeless tobacco: Never Used  Substance Use Topics  . Alcohol use: No    Alcohol/week: 0.0 standard drinks  . Drug use: No    Family History Family History  Problem Relation Age of Onset  . Colon cancer Father 91  . Arthritis Father   . Cancer Father        colon/prostate  . Hypertension Father   . Arthritis Mother   . Hyperlipidemia Mother   . Heart disease Mother   . Stroke Mother   . Hypertension Mother   . Kidney disease Mother   . Diabetes Mother        Type 1  . Congestive Heart Failure Mother   . Stroke Maternal Grandmother   . Cancer Maternal Grandfather        prostate  . Stroke Paternal Grandfather   . Breast cancer Cousin 35       has contact    Allergies  Allergen Reactions  . Sertraline Diarrhea  REVIEW OF SYSTEMS (Negative unless checked)  Constitutional: [] Weight loss  [] Fever  [] Chills Cardiac: [] Chest pain   [] Chest pressure   [] Palpitations   [] Shortness of breath when laying flat   [] Shortness of breath with exertion. Vascular:  [] Pain in legs with walking   [x] Pain in legs at rest  [] History of DVT   [] Phlebitis   [] Swelling in legs   [] Varicose veins   [] Non-healing ulcers Pulmonary:   [] Uses home oxygen   [] Productive cough   [] Hemoptysis   [] Wheeze  [] COPD   [] Asthma Neurologic:  [] Dizziness   [] Seizures   [] History of stroke   [] History of TIA  [] Aphasia   [] Vissual changes   [] Weakness or numbness in arm   [] Weakness or numbness in leg Musculoskeletal:   [] Joint swelling   [] Joint pain   [x] Low back pain Hematologic:  [] Easy bruising  [] Easy bleeding   [] Hypercoagulable state    [] Anemic Gastrointestinal:  [] Diarrhea   [] Vomiting  [] Gastroesophageal reflux/heartburn   [] Difficulty swallowing. Genitourinary:  [] Chronic kidney disease   [] Difficult urination  [] Frequent urination   [] Blood in urine Skin:  [] Rashes   [] Ulcers  Psychological:  [] History of anxiety   []  History of major depression.  Physical Examination  Vitals:   07/13/19 1500  BP: 131/77  Pulse: 62  Resp: 16  Weight: 157 lb 12.8 oz (71.6 kg)   Body mass index is 23.3 kg/m. Gen: WD/WN, NAD Head: [place]/AT, No temporalis wasting.  Ear/Nose/Throat: Hearing grossly intact, nares w/o erythema or drainage Eyes: PER, EOMI, sclera nonicteric.  Neck: Supple, no large masses.   Pulmonary:  Good air movement, no audible wheezing bilaterally, no use of accessory muscles.  Cardiac: RRR, no JVD Vascular:  Vessel Right Left  Radial Palpable Palpable  Gastrointestinal: Non-distended. No guarding/no peritoneal signs.  Musculoskeletal: M/S 5/5 throughout.  No deformity or atrophy.  Neurologic: CN 2-12 intact. Symmetrical.  Speech is fluent. Motor exam as listed above. Psychiatric: Judgment intact, Mood & affect appropriate for pt's clinical situation. Dermatologic: No rashes or ulcers noted.  No changes consistent with cellulitis.   CBC Lab Results  Component Value Date   WBC 6.3 01/08/2018   HGB 14.0 01/08/2018   HCT 42.1 01/08/2018   MCV 91.3 01/08/2018   PLT 252.0 01/08/2018    BMET    Component Value Date/Time   NA 136 01/08/2018 1546   K 4.3 01/08/2018 1546   CL 103 01/08/2018 1546   CO2 28 01/08/2018 1546   GLUCOSE 83 01/08/2018 1546   BUN 14 01/08/2018 1546   CREATININE 0.75 01/08/2018 1546   CREATININE 0.73 07/01/2015 1527   CALCIUM 9.4 01/08/2018 1546   GFRNONAA >60 05/30/2017 1840   GFRAA >60 05/30/2017 1840   CrCl cannot be calculated (Patient's most recent lab result is older than the maximum 21 days allowed.).  COAG Lab Results  Component Value Date   INR 1.05 07/01/2015     Radiology VAS [place] LOWER EXTREMITY VENOUS (DVT)  Result Date: 06/25/2019  Lower Venous DVTStudy Other Indications: Previous lower leg swelling now resolved. Performing Technologist: Concha Norway RVT  Examination Guidelines: A complete evaluation includes B-mode imaging, spectral Doppler, color Doppler, and power Doppler as needed of all accessible portions of each vessel. Bilateral testing is considered an integral part of a complete examination. Limited examinations for reoccurring indications may be performed as noted. The reflux portion of the exam is performed with the patient in reverse Trendelenburg.  +-----+---------------+---------+-----------+----------+--------------+ RIGHTCompressibilityPhasicitySpontaneityPropertiesThrombus Aging +-----+---------------+---------+-----------+----------+--------------+ CFV  Full  Yes      Yes                                 +-----+---------------+---------+-----------+----------+--------------+ SFJ  Full           Yes      Yes                                 +-----+---------------+---------+-----------+----------+--------------+   +---------+---------------+---------+-----------+----------+--------------+ LEFT     CompressibilityPhasicitySpontaneityPropertiesThrombus Aging +---------+---------------+---------+-----------+----------+--------------+ CFV      Full           Yes      Yes                                 +---------+---------------+---------+-----------+----------+--------------+ SFJ      Full           Yes      Yes                                 +---------+---------------+---------+-----------+----------+--------------+ FV Prox  Full           Yes      Yes                                 +---------+---------------+---------+-----------+----------+--------------+ FV Mid   Full           Yes      Yes                                  +---------+---------------+---------+-----------+----------+--------------+ FV DistalFull           Yes      Yes                                 +---------+---------------+---------+-----------+----------+--------------+ PFV      Full           Yes      Yes                                 +---------+---------------+---------+-----------+----------+--------------+ POP      Full           Yes      Yes                                 +---------+---------------+---------+-----------+----------+--------------+ PTV      Full           Yes      Yes                                 +---------+---------------+---------+-----------+----------+--------------+ PERO     Full           Yes      Yes                                 +---------+---------------+---------+-----------+----------+--------------+  GSV      Full           Yes      Yes                                 +---------+---------------+---------+-----------+----------+--------------+ SSV      Full           Yes      Yes                                 +---------+---------------+---------+-----------+----------+--------------+     Summary: RIGHT: - No evidence of deep vein thrombosis in the lower extremity. No indirect evidence of obstruction proximal to the inguinal ligament.  LEFT: - There is no evidence of deep vein thrombosis in the lower extremity. - There is no evidence of superficial venous thrombosis.  *See table(s) above for measurements and observations. Electronically signed by Hortencia Pilar MD on 06/25/2019 at 9:07:19 AM.    Final      Assessment/Plan 1. Pain of lower extremity, unspecified laterality Recommend:  I do not find evidence of Vascular pathology that would explain the patient's symptoms  The patient has atypical pain symptoms for vascular disease  I do not find evidence of Vascular pathology that would explain the patient's symptoms and I suspect the patient is c/o pseudoclaudication.   Patient should have an evaluation of his LS spine which I defer to the primary service.  Noninvasive studies including venous ultrasound of the legs do not identify vascular problems  The patient should continue walking and begin a more formal exercise program. The patient should continue his antiplatelet therapy and aggressive treatment of the lipid abnormalities. The patient should begin wearing graduated compression socks 15-20 mmHg strength to control her mild edema.  Patient will follow-up with me on a PRN basis  Further work-up of her lower extremity pain is deferred to the primary service     2. Chronic venous insufficiency No surgery or intervention at this point in time.  I have reviewed my discussion with the patient regarding venous insufficiency and why it causes symptoms. I have discussed with the patient the chronic skin changes that accompany venous insufficiency and the long term sequela such as ulceration. Patient will contnue wearing graduated compression stockings on a daily basis, as this has provided excellent control of his edema. The patient will put the stockings on first thing in the morning and removing them in the evening. The patient is reminded not to sleep in the stockings.  In addition, behavioral modification including elevation during the day will be initiated. Exercise is strongly encouraged.  Given the patient's good control and lack of any problems regarding the venous insufficiency and lymphedema a lymph pump in not need at this time.  The patient will follow up with me PRN should anything change.  The patient voices agreement with this plan.    Hortencia Pilar, MD  07/13/2019 3:07 PM

## 2019-07-29 ENCOUNTER — Telehealth: Payer: Self-pay

## 2019-07-29 DIAGNOSIS — F419 Anxiety disorder, unspecified: Secondary | ICD-10-CM

## 2019-07-29 NOTE — Telephone Encounter (Signed)
Patient scheduled for f/u with you on 6/25 & scheduled for labs in 6/9. Can labs be ordered for patient?

## 2019-07-29 NOTE — Telephone Encounter (Signed)
Ordered Please sch 

## 2019-07-29 NOTE — Telephone Encounter (Signed)
Patient scheduled for labs & informed what labs had been ordered.

## 2019-07-29 NOTE — Addendum Note (Signed)
Addended by: Burnard Hawthorne on: 07/29/2019 04:45 PM   Modules accepted: Orders

## 2019-07-31 ENCOUNTER — Ambulatory Visit: Payer: BC Managed Care – PPO | Admitting: Internal Medicine

## 2019-08-04 ENCOUNTER — Ambulatory Visit: Payer: BC Managed Care – PPO | Admitting: Nurse Practitioner

## 2019-08-06 ENCOUNTER — Ambulatory Visit: Payer: BC Managed Care – PPO | Admitting: Internal Medicine

## 2019-08-06 ENCOUNTER — Encounter: Payer: Self-pay | Admitting: Internal Medicine

## 2019-08-06 ENCOUNTER — Other Ambulatory Visit: Payer: Self-pay

## 2019-08-06 VITALS — BP 100/80 | HR 57 | Ht 69.0 in | Wt 155.2 lb

## 2019-08-06 DIAGNOSIS — R079 Chest pain, unspecified: Secondary | ICD-10-CM | POA: Diagnosis not present

## 2019-08-06 NOTE — Patient Instructions (Signed)
Medication Instructions:  Your physician recommends that you continue on your current medications as directed. Please refer to the Current Medication list given to you today.  *If you need a refill on your cardiac medications before your next appointment, please call your pharmacy*   Lab Work: NONE  If you have labs (blood work) drawn today and your tests are completely normal, you will receive your results only by: Marland Kitchen MyChart Message (if you have MyChart) OR . A paper copy in the mail If you have any lab test that is abnormal or we need to change your treatment, we will call you to review the results.   Testing/Procedures: NONE    Follow-Up: At [facility], you and your health needs are our priority.  As part of our continuing mission to provide you with exceptional heart care, we have created designated Provider Care Teams.  These Care Teams include your primary Cardiologist (physician) and Advanced Practice Providers (APPs -  Physician Assistants and Nurse Practitioners) who all work together to provide you with the care you need, when you need it.  We recommend signing up for the patient portal called "MyChart".  Sign up information is provided on this After Visit Summary.  MyChart is used to connect with patients for Virtual Visits (Telemedicine).  Patients are able to view lab/test results, encounter notes, upcoming appointments, etc.  Non-urgent messages can be sent to your provider as well.   To learn more about what you can do with MyChart, go to [email or web address].    Your next appointment:   3 month(s)  The format for your next appointment:   In Person  Provider:    You may see or one of the following Advanced Practice Providers on your designated Care Team:    Murray Hodgkins, NP  Christell Faith, PA-C  Marrianne Mood, PA-C    Other Instructions Please increase activity and walking.   WALKING  Walking is a great form of exercise to increase your  strength, endurance and overall fitness.  A walking program can help you start slowly and gradually build endurance as you go.  Everyone's ability is different, so each person's starting point will be different.  You do not have to follow them exactly.  The are just samples. You should simply find out what's right for you and stick to that program.   In the beginning, you'll start off walking 2-3 times a day for short distances.  As you get stronger, you'll be walking further at just 1-2 times per day.  A. You Can Walk For A Certain Length Of Time Each Day    Walk 5 minutes 3 times per day.  Increase 2 minutes every 2 days (3 times per day).  Work up to 25-30 minutes (1-2 times per day).   Example:   Day 1-2 5 minutes 3 times per day   Day 7-8 12 minutes 2-3 times per day   Day 13-14 25 minutes 1-2 times per day  B. You Can Walk For a Certain Distance Each Day     Distance can be substituted for time.    Example:   3 trips to mailbox (at road)   3 trips to corner of block   3 trips around the block  C. Go to local [employer] and use the track.    Please only do the exercises that your therapist has initialed and dated

## 2019-08-06 NOTE — Progress Notes (Signed)
New Outpatient Visit Date: 08/06/2019  Primary Care Provider: Burnard Hawthorne, FNP [address] [place],  [place] [address]  Chief Complaint: Chest pain  HPI:  Margaret Mcfarland is a 49 y.o. female who is being seen today as a self-referral for evaluation of chest pain. She has a history of venous insufficiency kidney stones, retinal detachment, endometriosis, and anxiety. She reports a several year history of chest pain when she "gets in a hurry."  She describes it as a left-sided pressure that lasts a few minutes and resolves promptly when she slows down or stops.  There are no associated symptoms.  Margaret Mcfarland denies shortness of breath, palpitations, lightheadedness, and orthopnea.  She has chronic lower extremity edema, which she attributes to venous insufficiency.  This is well-controlled with use of compression stockings and "pumps."  Margaret Mcfarland underwent exercise tolerance testing by Dr. Nehemiah Massed [facility]) in 05/2016 for evaluation o the same chest pain that brings her in today.  The study was normal without evidence of ischemia or arrhythmia.  Margaret Mcfarland notes that she has been less active over the last few months and wonders if deconditioning could be response for some of her chest discomfort.  --------------------------------------------------------------------------------------------------  Cardiovascular History & Procedures: Cardiovascular Problems:  Chest pain  Risk Factors:  None  Cath/PCI:  None  CV Surgery:  None  EP Procedures and Devices:  None  Non-Invasive Evaluation(s):  Exercise tolerance test (05/15/2016, [facility]): Normal study without ischemia or arrhythmia.  The patient exercised 6:44 minutes with a maximal heart rate of 179 bpm (103% MPHR).  No angina was reported.  TTE (05/21/2016): LVEF 45-50% with trivial MR, mild TR, and mild PR.  Normal RV size and function.  Recent CV Pertinent Labs: Lab Results  Component Value Date    CHOL 181 10/17/2016   HDL 54.80 10/17/2016   LDLCALC 116 (H) 10/17/2016   TRIG 53.0 10/17/2016   CHOLHDL 3 10/17/2016   INR 1.05 07/01/2015   K 4.3 01/08/2018   BUN 14 01/08/2018   CREATININE 0.75 01/08/2018   CREATININE 0.73 07/01/2015    --------------------------------------------------------------------------------------------------  Past Medical History:  Diagnosis Date  . Anxiety   . Blood in stool   . Cataract   . Chicken pox   . Endometriosis of uterus 05/23/1993   LAP  . Genital warts   . Kidney disease    per pt medical screening form  . Kidney stone   . Motion sickness    cars  . PONV (postoperative nausea and vomiting)    nausea only  . Retinal detachment   . Sciatica of left side     Past Surgical History:  Procedure Laterality Date  . CATARACT EXTRACTION W/PHACO Left 06/26/2017   Procedure: CATARACT EXTRACTION PHACO AND INTRAOCULAR LENS PLACEMENT ([place]) LEFT;  Surgeon: Leandrew Koyanagi, MD;  Location: [facility];  Service: Ophthalmology;  Laterality: Left;  . COLONOSCOPY N/A 08/25/2014   Procedure: COLONOSCOPY;  Surgeon: Manya Silvas, MD;  Location: [facility] ENDOSCOPY;  Service: Endoscopy;  Laterality: N/A;  . endoscopy    . GUM SURGERY  2008  . laproscopy    . RETINAL DETACHMENT SURGERY      Current Meds  Medication Sig  . Calcium Carbonate-Vitamin D (CALCIUM 600+D PO) Take by mouth daily.  Marland Kitchen escitalopram (LEXAPRO) 10 MG tablet Take 1 tablet by mouth once daily  . ketorolac (ACULAR) 0.4 % SOLN Place 1 drop into the left eye 2 (two) times daily.  Marland Kitchen  SYSTANE 0.4-0.3 % SOLN   . [DISCONTINUED] JOLESSA 0.15-0.03 MG tablet Take 1 tablet by mouth daily.    Allergies: Sertraline  Social History   Tobacco Use  . Smoking status: Never Smoker  . Smokeless tobacco: Never Used  Substance Use Topics  . Alcohol use: No    Alcohol/week: 0.0 standard drinks  . Drug use: No    Family History  Problem Relation Age of Onset  . Colon cancer  Father 107  . Arthritis Father   . Cancer Father        colon/prostate  . Hypertension Father   . Arthritis Mother   . Hyperlipidemia Mother   . Heart disease Mother   . Stroke Mother   . Hypertension Mother   . Kidney disease Mother   . Diabetes Mother        Type 1  . Congestive Heart Failure Mother   . Stroke Maternal Grandmother   . Cancer Maternal Grandfather        prostate  . Stroke Paternal Grandfather   . Breast cancer Cousin 26       has contact    Review of Systems: A 12-system review of systems was performed and was negative except as noted in the HPI.  --------------------------------------------------------------------------------------------------  Physical Exam: BP 100/80 (BP Location: Right Arm, Patient Position: Sitting, Cuff Size: Normal)   Pulse (!) 57   Ht 5\' 9"  (1.753 m)   Wt 155 lb 4 oz (70.4 kg)   SpO2 97%   BMI 22.93 kg/m   General:  NAD.  Accompanied by her twin sister. HEENT: No conjunctival pallor or scleral icterus. Facemask in place. Neck: Supple without lymphadenopathy, thyromegaly, JVD, or HJR. No carotid bruit. Lungs: Normal work of breathing. Clear to auscultation bilaterally without wheezes or crackles. Heart: Bradycardic but regular without murmurs, rubs, or gallops. Non-displaced PMI. Abd: Bowel sounds present. Soft, NT/ND without hepatosplenomegaly Ext: Trace pretibial edema. Radial, PT, and DP pulses are 2+ bilaterally Skin: Warm and dry without rash. Neuro: CNIII-XII intact. Strength and fine-touch sensation intact in upper and lower extremities bilaterally. Psych: Normal mood and affect.  EKG:  Sinus bradycardia (HR 57 bpm).  Otherwise, no significant abnormality.  Lab Results  Component Value Date   WBC 6.3 01/08/2018   HGB 14.0 01/08/2018   HCT 42.1 01/08/2018   MCV 91.3 01/08/2018   PLT 252.0 01/08/2018    Lab Results  Component Value Date   NA 136 01/08/2018   K 4.3 01/08/2018   CL 103 01/08/2018   CO2 28  01/08/2018   BUN 14 01/08/2018   CREATININE 0.75 01/08/2018   GLUCOSE 83 01/08/2018   ALT 10 01/08/2018    Lab Results  Component Value Date   CHOL 181 10/17/2016   HDL 54.80 10/17/2016   LDLCALC 116 (H) 10/17/2016   TRIG 53.0 10/17/2016   CHOLHDL 3 10/17/2016     --------------------------------------------------------------------------------------------------  ASSESSMENT AND PLAN: Chest pain: Quality of pain is consistent with stable angina that has been present for years.  Exercise tolerance test for the same symptoms in 2018 was normal.  However, echo at that time noted mildly reduced LVEF (images are not available for review).  EKG today is normal other than mild sinus bradycardia.  Margaret Mcfarland does not exhibit signs or symptoms of heart failure.  We discussed repeated ETT/TTE but have agreed to defer this .  Margaret Mcfarland would like to work on increasing her activity to improve her conditioning.  If symptoms do  not improve, I would favor repeating and echo.  Ischemia testing modality would then be based on results of the echo.  I do not recommend medication changes at this time.  Follow-up: Return to clinic in 3 months.  Nelva Bush, MD 08/08/2019 11:15 AM

## 2019-08-08 ENCOUNTER — Encounter: Payer: Self-pay | Admitting: Internal Medicine

## 2019-08-10 ENCOUNTER — Telehealth: Payer: Self-pay

## 2019-08-10 ENCOUNTER — Ambulatory Visit: Payer: BC Managed Care – PPO | Admitting: Family

## 2019-08-10 NOTE — Telephone Encounter (Signed)
Pt returned our call she report her rash is just about gone completely away, she will call back to make an appt to see Dr Raliegh Ip if the rash comes back

## 2019-08-12 ENCOUNTER — Other Ambulatory Visit: Payer: Self-pay

## 2019-08-12 ENCOUNTER — Other Ambulatory Visit (INDEPENDENT_AMBULATORY_CARE_PROVIDER_SITE_OTHER): Payer: BC Managed Care – PPO

## 2019-08-12 DIAGNOSIS — F419 Anxiety disorder, unspecified: Secondary | ICD-10-CM

## 2019-08-12 LAB — LIPID PANEL
Cholesterol: 191 mg/dL (ref 0–200)
HDL: 62.4 mg/dL (ref >39.00)
LDL Cholesterol: 120 mg/dL — ABNORMAL HIGH (ref 0–99)
NonHDL: 128.49
Total CHOL/HDL Ratio: 3
Triglycerides: 44 mg/dL (ref 0.0–149.0)
VLDL: 8.8 mg/dL (ref 0.0–40.0)

## 2019-08-12 LAB — COMPREHENSIVE METABOLIC PANEL
ALT: 22 U/L (ref 0–35)
AST: 19 U/L (ref 0–37)
Albumin: 4.5 g/dL (ref 3.5–5.2)
Alkaline Phosphatase: 57 U/L (ref 39–117)
BUN: 14 mg/dL (ref 6–23)
CO2: 29 mEq/L (ref 19–32)
Calcium: 9.5 mg/dL (ref 8.4–10.5)
Chloride: 103 mEq/L (ref 96–112)
Creatinine, Ser: 0.73 mg/dL (ref 0.40–1.20)
GFR: 84.03 mL/min (ref >60.00)
Glucose, Bld: 80 mg/dL (ref 70–99)
Potassium: 4.2 mEq/L (ref 3.5–5.1)
Sodium: 138 mEq/L (ref 135–145)
Total Bilirubin: 0.8 mg/dL (ref 0.2–1.2)
Total Protein: 7 g/dL (ref 6.0–8.3)

## 2019-08-12 LAB — HEMOGLOBIN A1C: Hgb A1c MFr Bld: 5.5 % (ref 4.6–6.5)

## 2019-08-18 ENCOUNTER — Ambulatory Visit: Payer: BC Managed Care – PPO | Admitting: Nurse Practitioner

## 2019-08-25 ENCOUNTER — Ambulatory Visit: Payer: BC Managed Care – PPO | Admitting: Adult Health

## 2019-08-25 ENCOUNTER — Other Ambulatory Visit: Payer: Self-pay

## 2019-08-25 ENCOUNTER — Telehealth: Payer: Self-pay | Admitting: Family

## 2019-08-25 NOTE — Telephone Encounter (Signed)
error 

## 2019-08-27 ENCOUNTER — Ambulatory Visit: Payer: BC Managed Care – PPO | Admitting: Adult Health

## 2019-08-28 ENCOUNTER — Other Ambulatory Visit: Payer: Self-pay

## 2019-08-28 ENCOUNTER — Ambulatory Visit (INDEPENDENT_AMBULATORY_CARE_PROVIDER_SITE_OTHER): Payer: BC Managed Care – PPO | Admitting: Family

## 2019-08-28 ENCOUNTER — Ambulatory Visit (INDEPENDENT_AMBULATORY_CARE_PROVIDER_SITE_OTHER): Payer: BC Managed Care – PPO

## 2019-08-28 ENCOUNTER — Encounter: Payer: Self-pay | Admitting: Family

## 2019-08-28 VITALS — BP 100/70 | HR 70 | Temp 97.6°F | Ht 69.0 in | Wt 157.0 lb

## 2019-08-28 DIAGNOSIS — M79675 Pain in left toe(s): Secondary | ICD-10-CM

## 2019-08-28 DIAGNOSIS — F419 Anxiety disorder, unspecified: Secondary | ICD-10-CM | POA: Diagnosis not present

## 2019-08-28 DIAGNOSIS — E785 Hyperlipidemia, unspecified: Secondary | ICD-10-CM | POA: Diagnosis not present

## 2019-08-28 DIAGNOSIS — Z23 Encounter for immunization: Secondary | ICD-10-CM

## 2019-08-28 NOTE — Assessment & Plan Note (Addendum)
discusssed family history in particular mother's history of stroke, hyperlipidemia.  Discussed patient's overall low risk for cardiovascular disease however she does have higher familial risk.  I advised a very low-dose statin medication.  Patient very politely declines at this time I would like to focus on lifestyle changes.  We will follow again in 6 months

## 2019-08-28 NOTE — Assessment & Plan Note (Signed)
Doing well on Lexapro, will continue

## 2019-08-28 NOTE — Progress Notes (Signed)
Subjective:    Patient ID: Margaret Mcfarland, female    DOB: Aug 29, 1970, 49 y.o.   MRN: [id number]  CC: Margaret Mcfarland is a 49 y.o. female who presents today for follow up.   HPI: Left ventral aspect under great toe swollen, tender x 7 days, improving. Able to walk now without pain.  Onset after an inch long splinter which was removed one week ago. Happened while walking on wood deck. Didn't trip or fall. Didnt stub toe.  No h/o gout No fever, increased joint, calf swelling, purulent discharge.  No DM  Tdap  08/16/19   GAD- Doing well on lexapro. Would like to continue  Follows with OB GYN      Recently seen by cardiology, Dr End 08/06/19 HISTORY:  Past Medical History:  Diagnosis Date  . Anxiety   . Blood in stool   . Cataract   . Chicken pox   . Endometriosis of uterus 05/23/1993   LAP  . Genital warts   . Kidney disease    per pt medical screening form  . Kidney stone   . Motion sickness    cars  . PONV (postoperative nausea and vomiting)    nausea only  . Retinal detachment   . Sciatica of left side    Past Surgical History:  Procedure Laterality Date  . CATARACT EXTRACTION W/PHACO Left 06/26/2017   Procedure: CATARACT EXTRACTION PHACO AND INTRAOCULAR LENS PLACEMENT ([place]) LEFT;  Surgeon: Leandrew Koyanagi, MD;  Location: [facility];  Service: Ophthalmology;  Laterality: Left;  . COLONOSCOPY N/A 08/25/2014   Procedure: COLONOSCOPY;  Surgeon: Manya Silvas, MD;  Location: [facility] ENDOSCOPY;  Service: Endoscopy;  Laterality: N/A;  . endoscopy    . GUM SURGERY  2008  . laproscopy    . RETINAL DETACHMENT SURGERY     Family History  Problem Relation Age of Onset  . Colon cancer Father 30  . Arthritis Father   . Cancer Father        colon/prostate  . Hypertension Father   . Arthritis Mother   . Hyperlipidemia Mother   . Heart disease Mother   . Stroke Mother   . Hypertension Mother   . Kidney disease Mother   . Diabetes Mother        Type 1  .  Congestive Heart Failure Mother   . Stroke Maternal Grandmother   . Cancer Maternal Grandfather        prostate  . Stroke Paternal Grandfather   . Breast cancer Cousin 49       has contact    Allergies: Sertraline Current Outpatient Medications on File Prior to Visit  Medication Sig Dispense Refill  . Calcium Carbonate-Vitamin D (CALCIUM 600+D PO) Take by mouth daily.    . Carboxymethylcellulose Sodium (ARTIFICIAL TEARS OP) Apply to eye. Into the right eye    . Cyanocobalamin (B-12 PO) Take by mouth daily.    Marland Kitchen escitalopram (LEXAPRO) 10 MG tablet Take 1 tablet by mouth once daily 90 tablet 0  . ketorolac (ACULAR) 0.4 % SOLN Place 1 drop into the left eye 2 (two) times daily.    . SYSTANE 0.4-0.3 % SOLN     . hydrocortisone 2.5 % cream Apply topically 2 (two) times daily as needed (Rash). Until clear (Patient not taking: Reported on 08/28/2019) 30 g 2  . hydroquinone 4 % cream Apply to affected areas at bedtime for 3 months. (Patient not taking: Reported on 08/28/2019) 28.35 g 2   No  current facility-administered medications on file prior to visit.    Social History   Tobacco Use  . Smoking status: Never Smoker  . Smokeless tobacco: Never Used  Vaping Use  . Vaping Use: Never used  Substance Use Topics  . Alcohol use: No    Alcohol/week: 0.0 standard drinks  . Drug use: No    Review of Systems  Constitutional: Negative for chills and fever.  Respiratory: Negative for cough.   Cardiovascular: Negative for chest pain and palpitations.  Gastrointestinal: Negative for nausea and vomiting.  Musculoskeletal: Positive for arthralgias (left toe).      Objective:    BP 100/70   Pulse 70   Temp 97.6 F (36.4 C) (Oral)   Ht 5\' 9"  (1.753 m)   Wt 157 lb (71.2 kg)   SpO2 97%   BMI 23.18 kg/m  BP Readings from Last 3 Encounters:  08/28/19 100/70  08/06/19 100/80  07/13/19 131/77   Wt Readings from Last 3 Encounters:  08/28/19 157 lb (71.2 kg)  08/06/19 155 lb 4 oz (70.4  kg)  07/13/19 157 lb 12.8 oz (71.6 kg)    Physical Exam Vitals reviewed.  Constitutional:      Appearance: She is well-developed.  Eyes:     Conjunctiva/sclera: Conjunctivae normal.  Cardiovascular:     Rate and Rhythm: Normal rate and regular rhythm.     Pulses: Normal pulses.     Heart sounds: Normal heart sounds.  Pulmonary:     Effort: Pulmonary effort is normal.     Breath sounds: Normal breath sounds. No wheezing, rhonchi or rales.  Musculoskeletal:     Left foot: Normal range of motion. No bony tenderness.     Comments: Ventral side left great toe shows healed pinpoint wound with slight localized swelling.  no purulent discharge, erythema, increased heat.  Nonfluctuant.  Skin:    General: Skin is warm and dry.  Neurological:     Mental Status: She is alert.  Psychiatric:        Speech: Speech normal.        Behavior: Behavior normal.        Thought Content: Thought content normal.        Assessment & Plan:   Problem List Items Addressed This Visit      Other   Anxiety    Doing well on Lexapro, will continue      Great toe pain, left    Localized swelling, redness has improved.  No signs or symptoms to suggest infection. I do not anticipate foreign body being retained.  Pending x-ray to ensure no occult fracture.  Patient understands to let me know if swelling does not resolve in its entirety.  Tdap given      Relevant Orders   DG Foot Complete Left   HLD (hyperlipidemia)    discusssed family history in particular mother's history of stroke, hyperlipidemia.  Discussed patient's overall low risk for cardiovascular disease however she does have higher familial risk.  I advised a very low-dose statin medication.  Patient very politely declines at this time I would like to focus on lifestyle changes.  We will follow again in 6 months          I am having Godfrey Pick maintain her Calcium Carbonate-Vitamin D (CALCIUM 600+D PO), Systane, ketorolac, escitalopram,  hydrocortisone, hydroquinone, Cyanocobalamin (B-12 PO), and Carboxymethylcellulose Sodium (ARTIFICIAL TEARS OP).   No orders of the defined types were placed in this encounter.   Return precautions  given.   Risks, benefits, and alternatives of the medications and treatment plan prescribed today were discussed, and patient expressed understanding.   Education regarding symptom management and diagnosis given to patient on AVS.  Continue to follow with Burnard Hawthorne, FNP for routine health maintenance.   Godfrey Pick and I agreed with plan.   Mable Paris, FNP

## 2019-08-28 NOTE — Addendum Note (Signed)
Addended by: Thressa Sheller on: 08/28/2019 03:22 PM   Modules accepted: Orders

## 2019-08-28 NOTE — Assessment & Plan Note (Signed)
Localized swelling, redness has improved.  No signs or symptoms to suggest infection. I do not anticipate foreign body being retained.  Pending x-ray to ensure no occult fracture.  Patient understands to let me know if swelling does not resolve in its entirety.  Tdap given

## 2019-08-28 NOTE — Patient Instructions (Signed)
Please focus on diet with low trans and saturated fats.  We will recheck cholesterol in 6 months. Please let me know if left toe pain dose not continue to improve

## 2019-09-04 ENCOUNTER — Telehealth: Payer: Self-pay | Admitting: Family

## 2019-09-04 DIAGNOSIS — F419 Anxiety disorder, unspecified: Secondary | ICD-10-CM

## 2019-09-04 MED ORDER — AMOXICILLIN-POT CLAVULANATE 875-125 MG PO TABS
1.0000 | ORAL_TABLET | Freq: Two times a day (BID) | ORAL | 0 refills | Status: DC
Start: 2019-09-04 — End: 2019-09-19

## 2019-09-04 MED ORDER — AMOXICILLIN-POT CLAVULANATE 875-125 MG PO TABS: 1.0000 | ORAL_TABLET | Freq: Two times a day (BID) | ORAL | 0 refills | Status: DC

## 2019-09-04 MED ORDER — ESCITALOPRAM OXALATE 10 MG PO TABS: 10.0000 mg | ORAL_TABLET | Freq: Every day | ORAL | 0 refills | Status: DC

## 2019-09-04 NOTE — Telephone Encounter (Signed)
Patient has been notified

## 2019-09-04 NOTE — Telephone Encounter (Signed)
Pt is still having toe pain and it is not better. She said that Margaret Mcfarland told her she would call her in an antibiotic if it was better.   Pt said she needed to let Margaret Mcfarland know the name of the neropathy medication she is taking it is called alphalipoic acid 200mg  3x a day  Pt also needs refill on Lexapro.

## 2019-09-04 NOTE — Telephone Encounter (Signed)
AUGMENTIN TWICE DAILY X 7 DAYS.  PROBIOTIC ADVISED FOLLOW UP WITH MARGARETTE AFTER FINISHING ABX  PLEASE ADD THE ALPHA LIPOIC ACID TO MED LIST

## 2019-09-07 ENCOUNTER — Ambulatory Visit: Payer: BC Managed Care – PPO | Admitting: Dermatology

## 2019-09-07 ENCOUNTER — Other Ambulatory Visit: Payer: Self-pay

## 2019-09-07 ENCOUNTER — Encounter: Payer: Self-pay | Admitting: Dermatology

## 2019-09-07 DIAGNOSIS — B079 Viral wart, unspecified: Secondary | ICD-10-CM

## 2019-09-07 NOTE — Patient Instructions (Signed)
Prior to procedure, discussed risks of blister formation, small wound, skin dyspigmentation, or rare scar following cryotherapy.   Cryotherapy Aftercare  . Wash gently with soap and water everyday.   . Apply Vaseline and Band-Aid daily until healed.  

## 2019-09-07 NOTE — Progress Notes (Signed)
   Follow-Up Visit   Subjective  Margaret Mcfarland is a 49 y.o. female who presents for the following: Warts.  Patient presents today for Follow up on wart on Right index DIP finger, has had cryotherapy on 06/29/19.  Did not clear up with just the one treatment.  Rash on knee got better.   The following portions of the chart were reviewed this encounter and updated as appropriate:      Review of Systems:  No other skin or systemic complaints except as noted in HPI or Assessment and Plan.  Objective  Well appearing patient in no apparent distress; mood and affect are within normal limits.  A focused examination was performed including Right index finger. Relevant physical exam findings are noted in the Assessment and Plan.  Objective  Right Distal Index finger DIP: 3 mm verrucous papule   Assessment & Plan  Viral warts, unspecified type Right Distal Index finger DIP  Cryotherapy today Discussed viral etiology and risk of spread.  Discussed multiple treatments may be required to clear warts.  Discussed possible post-treatment dyspigmentation and risk of recurrence.     Destruction of lesion - Right Distal Index finger DIP  Destruction method: cryotherapy   Informed consent: discussed and consent obtained   Lesion destroyed using liquid nitrogen: Yes   Region frozen until ice ball extended beyond lesion: Yes   Outcome: patient tolerated procedure well with no complications   Post-procedure details: wound care instructions given    Return in about 1 month (around 10/08/2019) for Wart.  Marene Lenz, CMA, am acting as scribe for Brendolyn Patty, MD .  Documentation: I have reviewed the above documentation for accuracy and completeness, and I agree with the above.  Brendolyn Patty MD

## 2019-09-16 ENCOUNTER — Ambulatory Visit: Payer: BC Managed Care – PPO | Admitting: Internal Medicine

## 2019-09-19 ENCOUNTER — Other Ambulatory Visit: Payer: Self-pay

## 2019-09-19 ENCOUNTER — Ambulatory Visit (INDEPENDENT_AMBULATORY_CARE_PROVIDER_SITE_OTHER): Payer: BC Managed Care – PPO | Admitting: Family Medicine

## 2019-09-19 ENCOUNTER — Encounter: Payer: Self-pay | Admitting: Family Medicine

## 2019-09-19 VITALS — BP 102/70 | HR 62 | Temp 97.1°F | Ht 69.0 in | Wt 154.0 lb

## 2019-09-19 DIAGNOSIS — B029 Zoster without complications: Secondary | ICD-10-CM | POA: Diagnosis not present

## 2019-09-19 MED ORDER — VALACYCLOVIR HCL 1 G PO TABS: 1000.0000 mg | ORAL_TABLET | Freq: Three times a day (TID) | ORAL | 0 refills | Status: DC

## 2019-09-19 MED ORDER — ACETAMINOPHEN-CODEINE #3 300-30 MG PO TABS: 1.0000 | ORAL_TABLET | Freq: Three times a day (TID) | ORAL | 0 refills | Status: DC | PRN

## 2019-09-19 NOTE — Progress Notes (Signed)
Patient: Margaret Mcfarland MRN: [id number] DOB: 1971-02-24 PCP: Burnard Hawthorne, FNP     Subjective:  Chief Complaint  Patient presents with  . Rash    back; left side and breast.    HPI: The patient is a 49 y.o. female who presents today for a rash located on her back and left breast, noticing yesterday morning. Rash burns and hurts. It is vesicles on a red base. She has not used anything for it. States pain is not that bad. Has not had any recent illness or been around anyone. Her sister did get a shingles shot yesterday, but she has had no vaccines recently.   Review of Systems  Constitutional: Negative for chills and fever.  Respiratory: Negative for shortness of breath and wheezing.   Skin: Positive for rash.    Allergies Patient is allergic to sertraline.  Past Medical History Patient  has a past medical history of Anxiety, Blood in stool, Cataract, Chicken pox, Endometriosis of uterus (05/23/1993), Genital warts, Kidney disease, Kidney stone, Motion sickness, PONV (postoperative nausea and vomiting), Retinal detachment, and Sciatica of left side.  Surgical History Patient  has a past surgical history that includes Gum surgery (2008); endoscopy; laproscopy; Colonoscopy (N/A, 08/25/2014); Cataract extraction w/PHACO (Left, 06/26/2017); and Retinal detachment surgery.  Family History Pateint's family history includes Arthritis in her father and mother; Breast cancer (age of onset: 28) in her cousin; Cancer in her father and maternal grandfather; Colon cancer (age of onset: 89) in her father; Congestive Heart Failure in her mother; Diabetes in her mother; Heart disease in her mother; Hyperlipidemia in her mother; Hypertension in her father and mother; Kidney disease in her mother; Stroke in her maternal grandmother, mother, and paternal grandfather.  Social History Patient  reports that she has never smoked. She has never used smokeless tobacco. She reports that she does not drink  alcohol and does not use drugs.    Objective: Vitals:   09/19/19 1053  BP: 102/70  Pulse: 62  Temp: (!) 97.1 F (36.2 C)  TempSrc: Temporal  SpO2: 97%  Weight: 154 lb (69.9 kg)  Height: 5\' 9"  (1.753 m)    Body mass index is 22.74 kg/m.  Physical Exam Vitals reviewed.  Constitutional:      Appearance: Normal appearance. She is normal weight.  HENT:     Head: Normocephalic and atraumatic.  Pulmonary:     Effort: Pulmonary effort is normal.  Skin:    Capillary Refill: Capillary refill takes less than 2 seconds.     Findings: Rash (vesicular rash on erythematous base in dermatomal line on left side of back (around t4t5) back around to under left breast ) present.  Neurological:     General: No focal deficit present.     Mental Status: She is alert and oriented to person, place, and time.        Assessment/plan: 1. Herpes zoster without complication Within 24 hour of onset. 7 day course of valtrex. Recommended ibuprofen, cool compresses and once scabbed tiger balm. Tylenol 3 as needed for severe pain. Advised to stay away from babies/immunocomprimised people until rash scabbed over and no new outbreaks. precautions given.    This visit occurred during the SARS-CoV-2 public health emergency.  Safety protocols were in place, including screening questions prior to the visit, additional usage of staff PPE, and extensive cleaning of exam room while observing appropriate contact time as indicated for disinfecting solutions.      Return if symptoms worsen or fail to  improve.   Orma Flaming, MD [place]   09/19/2019

## 2019-09-19 NOTE — Patient Instructions (Signed)
-ibuprofen 600-800mg  TID as needed for pain with food -topical tiger balm for burning (over the counter) -I sent in pain pills if pain severe-tylenol 3 -start valacyclovir to shorten duration of rash.  -stay away from pregnant or immunocompromised people until all rash has scabbed over   Nice to meet you! Dr. Rogers Blocker   Shingles  Shingles is an infection. It gives you a painful skin rash and blisters that have fluid in them. Shingles is caused by the same germ (virus) that causes chickenpox. Shingles only happens in people who:  Have had chickenpox.  Have been given a shot of medicine (vaccine) to protect against chickenpox. Shingles is rare in this group. The first symptoms of shingles may be itching, tingling, or pain in an area on your skin. A rash will show on your skin a few days or weeks later. The rash is likely to be on one side of your body. The rash usually has a shape like a belt or a band. Over time, the rash turns into fluid-filled blisters. The blisters will break open, change into scabs, and dry up. Medicines may:  Help with pain and itching.  Help you get better sooner.  Help to prevent long-term problems. Follow these instructions at home: Medicines  Take over-the-counter and prescription medicines only as told by your doctor.  Put on an anti-itch cream or numbing cream where you have a rash, blisters, or scabs. Do this as told by your doctor. Helping with itching and discomfort   Put cold, wet cloths (cold compresses) on the area of the rash or blisters as told by your doctor.  Cool baths can help you feel better. Try adding baking soda or dry oatmeal to the water to lessen itching. Do not bathe in hot water. Blister and rash care  Keep your rash covered with a loose bandage (dressing).  Wear loose clothing that does not rub on your rash.  Keep your rash and blisters clean. To do this, wash the area with mild soap and cool water as told by your  doctor.  Check your rash every day for signs of infection. Check for: ? More redness, swelling, or pain. ? Fluid or blood. ? Warmth. ? Pus or a bad smell.  Do not scratch your rash. Do not pick at your blisters. To help you to not scratch: ? Keep your fingernails clean and cut short. ? Wear gloves or mittens when you sleep, if scratching is a problem. General instructions  Rest as told by your doctor.  Keep all follow-up visits as told by your doctor. This is important.  Wash your hands often with soap and water. If soap and water are not available, use hand sanitizer. Doing this lowers your chance of getting a skin infection caused by germs (bacteria).  Your infection can cause chickenpox in people who have never had chickenpox or never got a shot of chickenpox vaccine. If you have blisters that did not change into scabs yet, try not to touch other people or be around other people, especially: ? Babies. ? Pregnant women. ? Children who have areas of red, itchy, or rough skin (eczema). ? Very old people who have transplants. ? People who have a long-term (chronic) sickness, like cancer or AIDS. Contact a doctor if:  Your pain does not get better with medicine.  Your pain does not get better after the rash heals.  You have any signs of infection in the rash area. These signs include: ? More redness,  swelling, or pain around the rash. ? Fluid or blood coming from the rash. ? The rash area feeling warm to the touch. ? Pus or a bad smell coming from the rash. Get help right away if:  The rash is on your face or nose.  You have pain in your face or pain by your eye.  You lose feeling on one side of your face.  You have trouble seeing.  You have ear pain, or you have ringing in your ear.  You have a loss of taste.  Your condition gets worse. Summary  Shingles gives you a painful skin rash and blisters that have fluid in them.  Shingles is an infection. It is caused by  the same germ (virus) that causes chickenpox.  Keep your rash covered with a loose bandage (dressing). Wear loose clothing that does not rub on your rash.  If you have blisters that did not change into scabs yet, try not to touch other people or be around people. This information is not intended to replace advice given to you by your health care provider. Make sure you discuss any questions you have with your health care provider. Document Revised: 06/13/2018 Document Reviewed: 10/24/2016 Elsevier Patient Education  2020 [employer].

## 2019-09-21 ENCOUNTER — Telehealth: Payer: BC Managed Care – PPO | Admitting: Nurse Practitioner

## 2019-09-24 ENCOUNTER — Telehealth: Payer: Self-pay

## 2019-09-24 NOTE — Telephone Encounter (Signed)
There isn't a cream for shingles that I prescribe. Treatment is the pills and pain control. The cream we talked about in appointment is over the counter and it's called capsaician cream.   Thanks!  Dr. Rogers Blocker

## 2019-09-24 NOTE — Telephone Encounter (Signed)
Called pt to give message below. She voiced understanding, and will pick up OTC cream.

## 2019-09-24 NOTE — Telephone Encounter (Signed)
Pt was seen by Dr Rogers Blocker on the 17th for shingles. She is requesting a cream for her shingles and that it be sent to [place], [place]

## 2019-09-25 ENCOUNTER — Telehealth: Payer: Self-pay | Admitting: Family

## 2019-09-25 NOTE — Telephone Encounter (Signed)
Pt needs to follow up with her PCP.  Thank You

## 2019-09-25 NOTE — Telephone Encounter (Signed)
Patient requested a phone call, she has a question regarding her shingles    .Marland Kitchen LAST APPOINTMENT DATE: 09/24/2019   NEXT APPOINTMENT DATE:@Visit  date not found  MEDICATION:acetaminophen-codeine (TYLENOL #3) 300-30 MG tablet  [place], [place]  **Let patient know to contact pharmacy at the end of the day to make sure medication is ready. **  ** Please notify patient to allow 48-72 hours to process**  **Encourage patient to contact the pharmacy for refills or they can request refills through [facility]**  CLINICAL FILLS OUT ALL BELOW:   LAST REFILL:  QTY:  REFILL DATE:    OTHER COMMENTS:    Okay for refill?  Please advise

## 2019-10-20 ENCOUNTER — Ambulatory Visit: Payer: BC Managed Care – PPO | Admitting: Dermatology

## 2019-10-20 ENCOUNTER — Ambulatory Visit: Payer: BC Managed Care – PPO | Admitting: Family Medicine

## 2019-10-20 ENCOUNTER — Other Ambulatory Visit: Payer: Self-pay

## 2019-10-20 DIAGNOSIS — L820 Inflamed seborrheic keratosis: Secondary | ICD-10-CM | POA: Diagnosis not present

## 2019-10-20 DIAGNOSIS — B079 Viral wart, unspecified: Secondary | ICD-10-CM | POA: Diagnosis not present

## 2019-10-20 DIAGNOSIS — L821 Other seborrheic keratosis: Secondary | ICD-10-CM | POA: Diagnosis not present

## 2019-10-20 NOTE — Progress Notes (Signed)
   Follow-Up Visit   Subjective  Margaret Mcfarland is a 49 y.o. female who presents for the following: growth (left breast, itchy. Treated with LN2 several years ago.) and Wart (right index finger PIP. Improving with LN2 treatment.). Spot on breast has recurred.  The following portions of the chart were reviewed this encounter and updated as appropriate:      Review of Systems:  No other skin or systemic complaints except as noted in HPI or Assessment and Plan.  Objective  Well appearing patient in no apparent distress; mood and affect are within normal limits.  A focused examination was performed including finger, chest. Relevant physical exam findings are noted in the Assessment and Plan.  Objective  Left Medial Breast: Erythematous keratotic or waxy stuck-on papule  Objective  Right Index Finger PIP: Small residual verrucous papule -- Discussed viral etiology and contagion.    Assessment & Plan   Seborrheic Keratoses - Stuck-on, waxy, tan-brown papules and plaques  - Discussed benign etiology and prognosis. - Observe - Call for any changes   Inflamed seborrheic keratosis Left Medial Breast  Destruction of lesion - Left Medial Breast  Destruction method: cryotherapy   Informed consent: discussed and consent obtained   Lesion destroyed using liquid nitrogen: Yes   Region frozen until ice ball extended beyond lesion: Yes   Outcome: patient tolerated procedure well with no complications   Post-procedure details: wound care instructions given    Viral warts, unspecified type Right Index Finger PIP  Discussed viral etiology and risk of spread.  Discussed multiple treatments may be required to clear warts.  Discussed possible post-treatment dyspigmentation and risk of recurrence.   Destruction of lesion - Right Index Finger PIP  Destruction method: cryotherapy   Informed consent: discussed and consent obtained   Lesion destroyed using liquid nitrogen: Yes   Region frozen  until ice ball extended beyond lesion: Yes   Outcome: patient tolerated procedure well with no complications   Post-procedure details: wound care instructions given    Return if symptoms worsen or fail to improve.   IJamesetta Orleans, CMA, am acting as scribe for Brendolyn Patty, MD .  Documentation: I have reviewed the above documentation for accuracy and completeness, and I agree with the above.  Brendolyn Patty MD

## 2019-10-20 NOTE — Patient Instructions (Signed)

## 2019-10-22 ENCOUNTER — Ambulatory Visit: Payer: BC Managed Care – PPO | Admitting: Family Medicine

## 2019-11-30 ENCOUNTER — Ambulatory Visit: Payer: BC Managed Care – PPO | Admitting: Dermatology

## 2019-12-07 ENCOUNTER — Ambulatory Visit: Payer: BC Managed Care – PPO | Admitting: Internal Medicine

## 2019-12-10 ENCOUNTER — Other Ambulatory Visit: Payer: Self-pay | Admitting: Family

## 2019-12-10 DIAGNOSIS — F419 Anxiety disorder, unspecified: Secondary | ICD-10-CM

## 2020-02-24 ENCOUNTER — Other Ambulatory Visit (HOSPITAL_COMMUNITY): Payer: Self-pay | Admitting: Pulmonary Disease

## 2020-02-29 ENCOUNTER — Ambulatory Visit: Payer: BC Managed Care – PPO | Admitting: Family

## 2020-03-02 ENCOUNTER — Other Ambulatory Visit: Payer: Self-pay | Admitting: Family

## 2020-03-02 DIAGNOSIS — F419 Anxiety disorder, unspecified: Secondary | ICD-10-CM

## 2020-03-14 ENCOUNTER — Encounter (INDEPENDENT_AMBULATORY_CARE_PROVIDER_SITE_OTHER): Payer: Self-pay

## 2020-03-14 ENCOUNTER — Ambulatory Visit (INDEPENDENT_AMBULATORY_CARE_PROVIDER_SITE_OTHER): Payer: BC Managed Care – PPO | Admitting: Vascular Surgery

## 2020-04-13 ENCOUNTER — Other Ambulatory Visit: Payer: Self-pay | Admitting: Neurology

## 2020-04-13 DIAGNOSIS — R202 Paresthesia of skin: Secondary | ICD-10-CM

## 2020-04-18 ENCOUNTER — Other Ambulatory Visit: Payer: Self-pay | Admitting: Neurology

## 2020-04-18 DIAGNOSIS — R339 Retention of urine, unspecified: Secondary | ICD-10-CM

## 2020-04-18 DIAGNOSIS — R202 Paresthesia of skin: Secondary | ICD-10-CM

## 2020-04-21 ENCOUNTER — Encounter: Payer: Self-pay | Admitting: Family

## 2020-05-03 ENCOUNTER — Ambulatory Visit
Admission: RE | Admit: 2020-05-03 | Discharge: 2020-05-03 | Disposition: A | Discharge status: Home / Self Care | Payer: BC Managed Care – PPO | Source: Ambulatory Visit | Attending: Neurology | Admitting: Neurology

## 2020-05-03 ENCOUNTER — Other Ambulatory Visit: Payer: Self-pay

## 2020-05-03 DIAGNOSIS — R339 Retention of urine, unspecified: Secondary | ICD-10-CM | POA: Insufficient documentation

## 2020-05-03 DIAGNOSIS — R202 Paresthesia of skin: Secondary | ICD-10-CM | POA: Diagnosis not present

## 2020-05-03 MED ORDER — GADOBUTROL 1 MMOL/ML IV SOLN
7.0000 mL | Freq: Once | INTRAVENOUS | Status: AC | PRN
  Administered 2020-05-03: 7 mL via INTRAVENOUS

## 2020-05-06 ENCOUNTER — Telehealth (INDEPENDENT_AMBULATORY_CARE_PROVIDER_SITE_OTHER): Payer: Self-pay | Admitting: Vascular Surgery

## 2020-05-06 NOTE — Telephone Encounter (Signed)
Per phone note in twin's chart:   Margaret Mcfarland   4:51 PM Note Called stating that her legs are aching and she has a burning sensation that goes up both legs especially the left leg. Patient states that she has been in pain for the past few months and would like to come in to be seen. Patient was last seen 01/2020 with le ven reflux studies. (GS) Please advise.     I spoke with pt and advised of Arna Medici Brown's advice. She stated that she would make an appt -  While trying to make an ABI and Reflux - F/U with [facility] - pt stated that she would have to call back after work - needed to check schedule when she was at home - will call back this afternoon.

## 2020-05-30 ENCOUNTER — Encounter: Payer: Self-pay | Admitting: Podiatry

## 2020-05-30 ENCOUNTER — Ambulatory Visit (INDEPENDENT_AMBULATORY_CARE_PROVIDER_SITE_OTHER): Payer: BC Managed Care – PPO

## 2020-05-30 ENCOUNTER — Ambulatory Visit: Payer: BC Managed Care – PPO | Admitting: Podiatry

## 2020-05-30 ENCOUNTER — Other Ambulatory Visit: Payer: Self-pay

## 2020-05-30 DIAGNOSIS — M778 Other enthesopathies, not elsewhere classified: Secondary | ICD-10-CM | POA: Diagnosis not present

## 2020-05-30 DIAGNOSIS — Q828 Other specified congenital malformations of skin: Secondary | ICD-10-CM | POA: Diagnosis not present

## 2020-05-30 DIAGNOSIS — B070 Plantar wart: Secondary | ICD-10-CM

## 2020-05-30 NOTE — Progress Notes (Signed)
Subjective:  Patient ID: Margaret Mcfarland, female    DOB: 27-Apr-1970,  MRN: [id number] HPI Chief Complaint  Patient presents with  . Foot Pain    Sub 1st MPJ left - stepped on splinter last summer, got some of it out, but continues to stay red and swollen occasionally, tender at times  . Toe Pain    Hallux (medial) left - callused area  . New Patient (Initial Visit)    50 y.o. female presents with the above complaint.   ROS: Denies fever chills nausea vomiting muscle aches pains calf pain back pain chest pain shortness of breath.  Past Medical History:  Diagnosis Date  . Anxiety   . Blood in stool   . Cataract   . Chicken pox   . Endometriosis of uterus 05/23/1993   LAP  . Genital warts   . Kidney disease    per pt medical screening form  . Kidney stone   . Motion sickness    cars  . PONV (postoperative nausea and vomiting)    nausea only  . Retinal detachment   . Sciatica of left side    Past Surgical History:  Procedure Laterality Date  . CATARACT EXTRACTION W/PHACO Left 06/26/2017   Procedure: CATARACT EXTRACTION PHACO AND INTRAOCULAR LENS PLACEMENT ([place]) LEFT;  Surgeon: Leandrew Koyanagi, MD;  Location: [facility];  Service: Ophthalmology;  Laterality: Left;  . COLONOSCOPY N/A 08/25/2014   Procedure: COLONOSCOPY;  Surgeon: Manya Silvas, MD;  Location: [facility] ENDOSCOPY;  Service: Endoscopy;  Laterality: N/A;  . endoscopy    . GUM SURGERY  2008  . laproscopy    . RETINAL DETACHMENT SURGERY      Current Outpatient Medications:  .  ALPRAZolam (XANAX) 1 MG tablet, Take 1 mg by mouth at bedtime as needed for anxiety., Disp: , Rfl:  .  Calcium Carbonate-Vitamin D (CALCIUM 600+D PO), Take by mouth daily., Disp: , Rfl:  .  escitalopram (LEXAPRO) 10 MG tablet, Take 1 tablet by mouth once daily, Disp: 90 tablet, Rfl: 0 .  fluticasone (FLONASE) 50 MCG/ACT nasal spray, Place into both nostrils., Disp: , Rfl:  .  LORazepam (ATIVAN) 1 MG tablet, Take by mouth., Disp: ,  Rfl:   Allergies  Allergen Reactions  . Sertraline Diarrhea   Review of Systems Objective:  There were no vitals filed for this visit.  General: Well developed, nourished, in no acute distress, alert and oriented x3   Dermatological: Skin is warm, dry and supple bilateral. Nails x 10 are well maintained; remaining integument appears unremarkable at this time. There are no open sores, no preulcerative lesions, no rash or signs of infection present.  Vascular: Dorsalis Pedis artery and Posterior Tibial artery pedal pulses are 2/4 bilateral with immedate capillary fill time. Pedal hair growth present. No varicosities and no lower extremity edema present bilateral.   Neruologic: Grossly intact via light touch bilateral. Vibratory intact via tuning fork bilateral. Protective threshold with Semmes Wienstein monofilament intact to all pedal sites bilateral. Patellar and Achilles deep tendon reflexes 2+ bilateral. No Babinski or clonus noted bilateral.   Musculoskeletal: No gross boney pedal deformities bilateral. No pain, crepitus, or limitation noted with foot and ankle range of motion bilateral. Muscular strength 5/5 in all groups tested bilateral.  Gait: Unassisted, Nonantalgic.    Radiographs:  Radiographs taken today demonstrate osseously mature individual no foreign bodies identified in the left foot.  Mild hallux valgus and hallux interphalangeus noted.  Assessment & Plan:   Assessment: Probable  wart to the medial aspect of the first metatarsophalangeal joint left.  Also callus to the hallux interphalangeal joint left.  Plan: Discussed etiology pathology conservative surgical therapies I debrided her reactive hyperkeratosis for her today.  Discussed appropriate shoe gear stretching exercise ice therapy sugar modifications.  Also discussed the possibility of this wart becoming active and I will follow-up with her if it does.     Thaxton Pelley T. [place], [place]

## 2020-05-31 ENCOUNTER — Other Ambulatory Visit: Payer: Self-pay | Admitting: Family

## 2020-05-31 DIAGNOSIS — F419 Anxiety disorder, unspecified: Secondary | ICD-10-CM

## 2020-07-18 ENCOUNTER — Telehealth: Payer: Self-pay | Admitting: Family

## 2020-07-18 NOTE — Telephone Encounter (Signed)
PT called in to schedule appointment for tick bites and rash that has been going on for a week or two she states.

## 2020-07-18 NOTE — Telephone Encounter (Signed)
Just FYI patient seeing [facility].

## 2020-07-18 NOTE — Progress Notes (Deleted)
Acute Office Visit  Subjective:    Patient ID: Margaret Mcfarland, female    DOB: 04/25/1970, 50 y.o.   MRN: [id number]  No chief complaint on file.   HPI Patient is in today for ***  Past Medical History:  Diagnosis Date  . Anxiety   . Blood in stool   . Cataract   . Chicken pox   . Endometriosis of uterus 05/23/1993   LAP  . Genital warts   . Kidney disease    per pt medical screening form  . Kidney stone   . Motion sickness    cars  . PONV (postoperative nausea and vomiting)    nausea only  . Retinal detachment   . Sciatica of left side     Past Surgical History:  Procedure Laterality Date  . CATARACT EXTRACTION W/PHACO Left 06/26/2017   Procedure: CATARACT EXTRACTION PHACO AND INTRAOCULAR LENS PLACEMENT ([place]) LEFT;  Surgeon: Leandrew Koyanagi, MD;  Location: [facility];  Service: Ophthalmology;  Laterality: Left;  . COLONOSCOPY N/A 08/25/2014   Procedure: COLONOSCOPY;  Surgeon: Manya Silvas, MD;  Location: [facility] ENDOSCOPY;  Service: Endoscopy;  Laterality: N/A;  . endoscopy    . GUM SURGERY  2008  . laproscopy    . RETINAL DETACHMENT SURGERY      Family History  Problem Relation Age of Onset  . Colon cancer Father 31  . Arthritis Father   . Cancer Father        colon/prostate  . Hypertension Father   . Arthritis Mother   . Hyperlipidemia Mother   . Heart disease Mother   . Stroke Mother   . Hypertension Mother   . Kidney disease Mother   . Diabetes Mother        Type 1  . Congestive Heart Failure Mother   . Stroke Maternal Grandmother   . Cancer Maternal Grandfather        prostate  . Stroke Paternal Grandfather   . Breast cancer Cousin 55       has contact    Social History   Socioeconomic History  . Marital status: Single    Spouse name: Not on file  . Number of children: Not on file  . Years of education: Not on file  . Highest education level: Not on file  Occupational History  . Not on file  Tobacco Use  . Smoking status:  Never Smoker  . Smokeless tobacco: Never Used  Vaping Use  . Vaping Use: Never used  Substance and Sexual Activity  . Alcohol use: No    Alcohol/week: 0.0 standard drinks  . Drug use: No  . Sexual activity: Never  Other Topics Concern  . Not on file  Social History Narrative   Lives with twin sister   Work- Acupuncturist    No pets    No children    Right handed    No caffeine daily- tea occasionally; eats chocolate    Enjoys- shopping and eating out    Social Determinants of Radio broadcast assistant Strain: Not on file  Food Insecurity: Not on file  Transportation Needs: Not on file  Physical Activity: Not on file  Stress: Not on file  Social Connections: Not on file  Intimate Partner Violence: Not on file    Outpatient Medications Prior to Visit  Medication Sig Dispense Refill  . ALPRAZolam (XANAX) 1 MG tablet Take 1 mg by mouth at bedtime as needed for anxiety.    Marland Kitchen  Calcium Carbonate-Vitamin D (CALCIUM 600+D PO) Take by mouth daily.    Marland Kitchen escitalopram (LEXAPRO) 10 MG tablet Take 1 tablet by mouth once daily 90 tablet 0  . fluticasone (FLONASE) 50 MCG/ACT nasal spray Place into both nostrils.    Marland Kitchen LORazepam (ATIVAN) 1 MG tablet Take by mouth.     No facility-administered medications prior to visit.    Allergies  Allergen Reactions  . Sertraline Diarrhea    Review of Systems     Objective:    Physical Exam  There were no vitals taken for this visit. Wt Readings from Last 3 Encounters:  09/19/19 154 lb (69.9 kg)  08/28/19 157 lb (71.2 kg)  08/06/19 155 lb 4 oz (70.4 kg)    Health Maintenance Due  Topic Date Due  . COVID-19 Vaccine (1) Never done  . HIV Screening  Never done  . Hepatitis C Screening  Never done    There are no preventive care reminders to display for this patient.   Lab Results  Component Value Date   TSH 1.69 01/08/2018   Lab Results  Component Value Date   WBC 6.3 01/08/2018   HGB 14.0 01/08/2018    HCT 42.1 01/08/2018   MCV 91.3 01/08/2018   PLT 252.0 01/08/2018   Lab Results  Component Value Date   NA 138 08/12/2019   K 4.2 08/12/2019   CO2 29 08/12/2019   GLUCOSE 80 08/12/2019   BUN 14 08/12/2019   CREATININE 0.73 08/12/2019   BILITOT 0.8 08/12/2019   ALKPHOS 57 08/12/2019   AST 19 08/12/2019   ALT 22 08/12/2019   PROT 7.0 08/12/2019   ALBUMIN 4.5 08/12/2019   CALCIUM 9.5 08/12/2019   ANIONGAP 8 05/30/2017   GFR 84.03 08/12/2019   Lab Results  Component Value Date   CHOL 191 08/12/2019   Lab Results  Component Value Date   HDL 62.40 08/12/2019   Lab Results  Component Value Date   LDLCALC 120 (H) 08/12/2019   Lab Results  Component Value Date   TRIG 44.0 08/12/2019   Lab Results  Component Value Date   CHOLHDL 3 08/12/2019   Lab Results  Component Value Date   HGBA1C 5.5 08/12/2019       Assessment & Plan:   Problem List Items Addressed This Visit   None      No orders of the defined types were placed in this encounter.    Marcille Buffy, FNP

## 2020-07-19 ENCOUNTER — Telehealth: Payer: Self-pay | Admitting: Family

## 2020-07-19 ENCOUNTER — Ambulatory Visit: Payer: BC Managed Care – PPO | Admitting: Adult Health

## 2020-07-19 NOTE — Telephone Encounter (Signed)
error 

## 2020-07-20 ENCOUNTER — Encounter: Payer: Self-pay | Admitting: Adult Health

## 2020-07-20 ENCOUNTER — Ambulatory Visit (INDEPENDENT_AMBULATORY_CARE_PROVIDER_SITE_OTHER): Payer: BC Managed Care – PPO | Admitting: Adult Health

## 2020-07-20 ENCOUNTER — Other Ambulatory Visit: Payer: Self-pay

## 2020-07-20 VITALS — BP 98/60 | HR 66 | Temp 97.1°F | Ht 69.0 in | Wt 151.6 lb

## 2020-07-20 DIAGNOSIS — S70369A Insect bite (nonvenomous), unspecified thigh, initial encounter: Secondary | ICD-10-CM | POA: Insufficient documentation

## 2020-07-20 DIAGNOSIS — W57XXXA Bitten or stung by nonvenomous insect and other nonvenomous arthropods, initial encounter: Secondary | ICD-10-CM | POA: Diagnosis not present

## 2020-07-20 DIAGNOSIS — L237 Allergic contact dermatitis due to plants, except food: Secondary | ICD-10-CM

## 2020-07-20 MED ORDER — TRIAMCINOLONE ACETONIDE 0.5 % EX OINT: 1.0000 "application " | TOPICAL_OINTMENT | Freq: Two times a day (BID) | CUTANEOUS | 0 refills | Status: DC

## 2020-07-20 MED ORDER — DOXYCYCLINE HYCLATE 100 MG PO TABS: 100.0000 mg | ORAL_TABLET | Freq: Two times a day (BID) | ORAL | 0 refills | Status: AC

## 2020-07-20 NOTE — Patient Instructions (Signed)
Contact Dermatitis Dermatitis is redness, soreness, and swelling (inflammation) of the skin. Contact dermatitis is a reaction to something that touches the skin. There are two types of contact dermatitis:  Irritant contact dermatitis. This happens when something bothers (irritates) your skin, like soap.  Allergic contact dermatitis. This is caused when you are exposed to something that you are allergic to, such as poison ivy. What are the causes?  Common causes of irritant contact dermatitis include: ? Makeup. ? Soaps. ? Detergents. ? Bleaches. ? Acids. ? Metals, such as nickel.  Common causes of allergic contact dermatitis include: ? Plants. ? Chemicals. ? Jewelry. ? Latex. ? Medicines. ? Preservatives in products, such as clothing. What increases the risk?  Having a job that exposes you to things that bother your skin.  Having asthma or eczema. What are the signs or symptoms? Symptoms may happen anywhere the irritant has touched your skin. Symptoms include:  Dry or flaky skin.  Redness.  Cracks.  Itching.  Pain or a burning feeling.  Blisters.  Blood or clear fluid draining from skin cracks. With allergic contact dermatitis, swelling may occur. This may happen in places such as the eyelids, mouth, or genitals.   How is this treated?  This condition is treated by checking for the cause of the reaction and protecting your skin. Treatment may also include: ? Steroid creams, ointments, or medicines. ? Antibiotic medicines or other ointments, if you have a skin infection. ? Lotion or medicines to help with itching. ? A bandage (dressing). Follow these instructions at home: Skin care  Moisturize your skin as needed.  Put cool cloths on your skin.  Put a baking soda paste on your skin. Stir water into baking soda until it looks like a paste.  Do not scratch your skin.  Avoid having things rub up against your skin.  Avoid the use of soaps, perfumes,  and dyes. Medicines  Take or apply over-the-counter and prescription medicines only as told by your doctor.  If you were prescribed an antibiotic medicine, take or apply it as told by your doctor. Do not stop using it even if your condition starts to get better. Bathing  Take a bath with: ? Epsom salts. ? Baking soda. ? Colloidal oatmeal.  Bathe less often.  Bathe in warm water. Avoid using hot water. Bandage care  If you were given a bandage, change it as told by your health care provider.  Wash your hands with soap and water before and after you change your bandage. If soap and water are not available, use hand sanitizer. General instructions  Avoid the things that caused your reaction. If you do not know what caused it, keep a journal. Write down: ? What you eat. ? What skin products you use. ? What you drink. ? What you wear in the area that has symptoms. This includes jewelry.  Check the affected areas every day for signs of infection. Check for: ? More redness, swelling, or pain. ? More fluid or blood. ? Warmth. ? Pus or a bad smell.  Keep all follow-up visits as told by your doctor. This is important. Contact a doctor if:  You do not get better with treatment.  Your condition gets worse.  You have signs of infection, such as: ? More swelling. ? Tenderness. ? More redness. ? Soreness. ? Warmth.  You have a fever.  You have new symptoms. Get help right away if:  You have a very bad headache.  You have neck pain.  Your neck is stiff.  You throw up (vomit).  You feel very sleepy.  You see red streaks coming from the area.  Your bone or joint near the area hurts after the skin has healed.  The area turns darker.  You have trouble breathing. Summary  Dermatitis is redness, soreness, and swelling of the skin.  Symptoms may occur where the irritant has touched you.  Treatment may include medicines and skin care.  If you do not know what  caused your reaction, keep a journal.  Contact a doctor if your condition gets worse or you have signs of infection. This information is not intended to replace advice given to you by your health care provider. Make sure you discuss any questions you have with your health care provider. Document Revised: 06/11/2018 Document Reviewed: 09/04/2017 Elsevier Patient Education  2021 [place], Adult  Ticks are insects that can bite. Most ticks live in shrubs and grassy areas. They climb onto people and animals that go by. Then they bite. Some ticks carry germs that can make you sick. How can I prevent tick bites? Take these steps: Use insect repellent  Use an insect repellent that has 20% or higher of the ingredients DEET, picaridin, or IR3535. Follow the instructions on the label. Put it on: ? Bare skin. ? The tops of your boots. ? Your pant legs. ? The ends of your sleeves.  If you use an insect repellent that has the ingredient permethrin, follow the instructions on the label. Put it on: ? Clothing. ? Boots. ? Supplies or outdoor gear. ? Tents. When you are outside  Wear long sleeves and long pants.  Wear light-colored clothes.  Tuck your pant legs into your socks.  Stay in the middle of the trail. Do not touch the bushes.  Avoid walking through long grass.  Check for ticks on your clothes, hair, and skin often while you are outside. Before going inside your house, check your clothes, skin, head, neck, armpits, waist, groin, and joint areas. When you go indoors  Check your clothes for ticks. Dry your clothes in a dryer on high heat for 10 minutes or more. If clothes are damp, additional time may be needed.  Wash your clothes right away if they need to be washed. Use hot water.  Check your pets and outdoor gear.  Shower right away.  Check your body for ticks. Do a full body check using a mirror. What is the right way to remove a tick? Remove the  tick from your skin as soon as possible. Do not remove the tick with your bare fingers.  To remove a tick that is crawling on your skin: ? Go outdoors and brush the tick off. ? Use tape or a lint roller.  To remove a tick that is biting: 1. Wash your hands. 2. If you have latex gloves, put them on. 3. Use tweezers, curved forceps, or a tick-removal tool to grasp the tick. Grasp the tick as close to your skin and as close to the tick's head as possible. 4. Gently pull up until the tick lets go.  Try to keep the tick's head attached to its body.  Do not twist or jerk the tick.  Do not squeeze or crush the tick. Do not try to remove a tick with heat, alcohol, petroleum jelly, or fingernail polish.   What should I do after taking out a tick?  Throw away the  tick. Do not crush a tick with your fingers.  Clean the bite area and your hands with soap and water, rubbing alcohol, or an iodine wash.  If an antiseptic cream or ointment is available, apply a small amount to the bite area.  Wash and disinfect any instruments that you used to remove the tick. How should I get rid of a live tick? To dispose of a live tick, use one of these methods:  Place the tick in rubbing alcohol.  Place the tick in a bag or container you can close tightly.  Wrap the tick tightly in tape.  Flush the tick down the toilet. Contact a doctor if:  You have symptoms, such as: ? A fever or chills. ? A red rash that makes a circle (bull's-eye rash) in the bite area. ? Redness and swelling where the tick bit you. ? Headache. ? Pain in a muscle, joint, or bone. ? Being more tired than normal. ? Trouble walking or moving your legs. ? Numbness in your legs. ? Tender and swollen lymph glands.  A part of a tick breaks off and gets stuck in your skin. Get help right away if:  You cannot remove a tick.  You cannot move (have paralysis) or feel weak.  You are feeling worse or have new symptoms.  You find  a tick that is biting you and filled with blood. This is important if you are in an area where diseases from ticks are common. Summary  Ticks may carry germs that can make you sick.  To prevent tick bites wear long sleeves, long pants, and light colors. Use insect repellent. Follow the instructions on the label.  If the tick is biting, do not try to remove it with heat, alcohol, petroleum jelly, or fingernail polish.  Use tweezers, curved forceps, or a tick-removal tool to grasp the tick. Gently pull up until the tick lets go. Do not twist or jerk the tick. Do not squeeze or crush the tick.  If you have symptoms, contact a doctor. This information is not intended to replace advice given to you by your health care provider. Make sure you discuss any questions you have with your health care provider. Document Revised: 02/16/2019 Document Reviewed: 02/16/2019 Elsevier Patient Education  2021 [place].   Lyme Disease Lyme disease is an infection that can affect many parts of the body, including the skin, joints, and nervous system. It is a bacterial infection that starts from the bite of an infected tick. Over time, the infection can worsen, and some of the symptoms are similar to the flu. If Lyme disease is not treated, it may cause joint pain, swelling, numbness, problems thinking, fatigue, muscle weakness, and other problems. What are the causes? This condition is caused by bacteria called Borrelia burgdorferi.  You can get Lyme disease by being bitten by an infected tick.  Only black-legged, or Ixodes, ticks that are infected with the bacteria can cause Lyme disease.  The tick must be attached to your skin for a certain period of time to pass along the infection. This is usually 36-48 hours.  Deer often carry infected ticks. What increases the risk? The following factors may make you more likely to develop this condition:  Living in or visiting these areas in the [place] ? [place]. ? [place]. ? [place].  Spending time in wooded or grassy areas.  Being outdoors with exposed skin.  Camping, gardening, hiking, fishing, hunting, or  working outdoors.  Failing to remove a tick from your skin. What are the signs or symptoms? Symptoms of this condition may include:  Chills and fever.  Headache.  Fatigue.  General achiness.  Muscle pain.  Joint pain, often in the knees.  A round, red rash that surrounds the center of the tick bite. The center of the rash may be blood colored or have tiny blisters.  Swollen lymph glands.  Stiff neck.   How is this diagnosed? This condition is diagnosed based on:  Your symptoms and medical history.  A physical exam.  A blood test. How is this treated? The main treatment for this condition is antibiotic medicine, which is usually taken by mouth (orally).  The length of treatment depends on how soon after a tick bite you begin taking the medicine. In some cases, treatment is necessary for several weeks.  If the infection is severe, antibiotics may need to be given through an IV that is inserted into one of your veins. Follow these instructions at home:  Take over-the-counter and prescription medicines only as told by your health care provider. Finish all antibiotic medicine, even when you start to feel better.  Ask your health care provider about taking a probiotic in between doses of your antibiotic to help avoid an upset stomach or diarrhea.  Check with your health care provider before supplementing your treatment. Many alternative therapies have not been proven and may be harmful to you.  Keep all follow-up visits as told by your health care provider. This is important. How is this prevented? You can become reinfected if you get another tick bite from an infected tick. Take these steps to help prevent an infection:  Cover your skin with light-colored clothing when you are  outdoors in the spring and summer months.  Spray clothing and skin with bug spray. The spray should be 20-30% DEET. You can also treat clothing with permethrin, and let it dry before you wear it. Do not apply permethrin directly to your skin. Permethrin can also be used to treat camping gear and boots. Always read and follow the instructions that come with a bug spray or insecticide.  Avoid wooded, grassy, and shaded areas.  Remove yard litter, brush, trash, and plants that attract deer and rodents.  Check yourself for ticks when you come indoors.  Wash clothing worn each day.  Shower after spending time outdoors.  Check your pets for ticks before they come inside.  If you find a tick attached to your skin: ? Remove it with tweezers. ? Clean your hands and the bite area with rubbing alcohol or soap and water. ? Dispose of the tick by putting it in rubbing alcohol, putting it in a sealed bag or container, or flushing it down the toilet. ? You may choose to save the tick in a sealed container if you wish for it to be tested at a later time. Pregnant women should take special care to avoid tick bites because it is possible that the infection may be passed along to the fetus.   Contact a health care provider if:  You have symptoms after treatment.  You have removed a tick and want to bring it to your health care provider for testing. Get help right away if:  You have an irregular heartbeat.  You have chest pain.  You have nerve pain.  Your face feels numb.  You develop the following: ? A stiff neck. ? A severe headache. ? Severe nausea  and vomiting. ? Sensitivity to light. Summary  Lyme disease is an infection that can affect many parts of the body, including the skin, joints, and nervous system.  This condition is caused by bacteria called Borrelia burgdorferi.  You can get Lyme disease by being bitten by an infected tick.  The main treatment for this condition is  antibiotic medicine. This information is not intended to replace advice given to you by your health care provider. Make sure you discuss any questions you have with your health care provider. Document Revised: 06/13/2018 Document Reviewed: 05/08/2018 Elsevier Patient Education  2021 [place] [facility] spotted fever is a bacterial infection that spreads to people through contact with certain ticks. The illness causes flu-like symptoms and a reddish-purple rash. The illness does not spread from person to person (is not contagious). When this condition is not treated right away, it can quickly become very serious, and can sometimes lead to long-term (chronic) health problems or even death. What are the causes? This condition is caused by a type of bacteria (Rickettsia rickettsii) that is carried by [place] dog ticks, brown dog ticks, and [employer] wood ticks. The infection spreads through:  A bite from an infected tick. Tick bites are usually painless, and they frequently are not noticed.  Infected tick blood, body fluids, or feces that get into the body through damaged skin, such as a small cut or sore. This could happen while removing a tick from a pet or from another person. What increases the risk? The following factors may make you more likely to develop this condition:  Spending a lot of time outdoors, especially in rural areas or areas with long grass.  Spending time outdoors during warm weather. Ticks are most active during warm weather. What are the signs or symptoms? Symptoms of this condition include:  Fever.  Muscle aches.  Headache.  Nausea.  Vomiting.  Poor appetite.  Abdominal pain.  A reddish-purple rash. ? This usually appears 2-5 days after the first symptoms begin. ? The rash often starts on the wrists, forearms, and ankles. It may then spread to the palms, the bottom of the feet, legs, and trunk. Symptoms may  develop 2-14 days after a tick bite.   How is this diagnosed? This condition is diagnosed based on:  Your medical history.  A physical exam.  Blood tests.  Whether you have recently been bitten by a tick or spent time in areas where: ? Ticks are common. ? [facility] spotted fever is common. How is this treated? This condition is treated with antibiotic medicines. It is important to begin treatment right away. In some cases, your health care provider may begin treatment before the diagnosis is confirmed. If your symptoms are severe, you may need to be treated in the hospital where you can get antibiotics and be monitored during treatment. Follow these instructions at home:  Take over-the-counter and prescription medicines only as told by your health care provider.  Take your antibiotic medicine as told by your health care provider. Do not stop taking the antibiotic even if you start to feel better.  Rest as much as possible until you feel better. Return to your normal activities as told by your health care provider.  Drink enough fluid to keep your urine pale yellow.  Keep all follow-up visits as told by your health care provider. This is important. Contact a health care provider if:  You have a rash that gets  increasingly red or swollen.  You have fluid draining from any areas of your rash. Get help right away if:  You develop a fever after being bitten by a tick.  You develop a rash 2-5 days after experiencing flu-like symptoms.  You have chest pain.  You have shortness of breath.  You have a severe headache.  You have jerky movements you cannot control (seizure).  You have severe abdominal pain.  You feel confused.  You bruise easily.  You have bleeding from your gums.  You have blood in your stool (feces).  You have trouble controlling when you urinate or have bowel movements (incontinence).  You have vision problems.  You have numbness or tingling in  your arms or legs. Summary  [facility] spotted fever is a bacterial infection that spreads to people through contact with certain ticks.  When this condition is not treated right away, it can quickly become very serious, and can sometimes lead to long-term (chronic) health problems or even death.  You are more likely to develop this infection if you spend time outdoors in warm weather and in areas with tall grass.  Symptoms of this condition include fever, headache, nausea, vomiting, abdominal pain, muscle aches, and a reddish-purple rash that usually appears 2-5 days after a fever.  This condition is treated with antibiotic medicines. This information is not intended to replace advice given to you by your health care provider. Make sure you discuss any questions you have with your health care provider. Document Revised: 02/22/2017 Document Reviewed: 05/17/2016 Elsevier Patient Education  [place].

## 2020-07-20 NOTE — Telephone Encounter (Signed)
Seen by [facility]

## 2020-07-20 NOTE — Progress Notes (Signed)
Acute Office Visit  Subjective:    Patient ID: Margaret Mcfarland, female    DOB: 22-May-1970, 50 y.o.   MRN: [id number]  No chief complaint on file.   HPI Patient is in today for poison oak on her right arm and abdomen x 5 days very itchy.    She also has removed three tick bites from her legs one on left anterior thigh is irritated.  She has been walking dogs and been outdoors.   Patient  denies any fever, body aches,chills, rash, chest pain, shortness of breath, nausea, vomiting, or diarrhea.  Denies dizziness, lightheadedness, pre syncopal or syncopal episodes.    Past Medical History:  Diagnosis Date  . Anxiety   . Blood in stool   . Cataract   . Chicken pox   . Endometriosis of uterus 05/23/1993   LAP  . Genital warts   . Kidney disease    per pt medical screening form  . Kidney stone   . Motion sickness    cars  . PONV (postoperative nausea and vomiting)    nausea only  . Retinal detachment   . Sciatica of left side     Past Surgical History:  Procedure Laterality Date  . CATARACT EXTRACTION W/PHACO Left 06/26/2017   Procedure: CATARACT EXTRACTION PHACO AND INTRAOCULAR LENS PLACEMENT ([place]) LEFT;  Surgeon: Leandrew Koyanagi, MD;  Location: [facility];  Service: Ophthalmology;  Laterality: Left;  . COLONOSCOPY N/A 08/25/2014   Procedure: COLONOSCOPY;  Surgeon: Manya Silvas, MD;  Location: [facility] ENDOSCOPY;  Service: Endoscopy;  Laterality: N/A;  . endoscopy    . GUM SURGERY  2008  . laproscopy    . RETINAL DETACHMENT SURGERY      Family History  Problem Relation Age of Onset  . Colon cancer Father 52  . Arthritis Father   . Cancer Father        colon/prostate  . Hypertension Father   . Arthritis Mother   . Hyperlipidemia Mother   . Heart disease Mother   . Stroke Mother   . Hypertension Mother   . Kidney disease Mother   . Diabetes Mother        Type 1  . Congestive Heart Failure Mother   . Stroke Maternal Grandmother   . Cancer Maternal  Grandfather        prostate  . Stroke Paternal Grandfather   . Breast cancer Cousin 55       has contact    Social History   Socioeconomic History  . Marital status: Single    Spouse name: Not on file  . Number of children: Not on file  . Years of education: Not on file  . Highest education level: Not on file  Occupational History  . Not on file  Tobacco Use  . Smoking status: Never Smoker  . Smokeless tobacco: Never Used  Vaping Use  . Vaping Use: Never used  Substance and Sexual Activity  . Alcohol use: No    Alcohol/week: 0.0 standard drinks  . Drug use: No  . Sexual activity: Never  Other Topics Concern  . Not on file  Social History Narrative   Lives with twin sister   Work- [employer]    No pets    No children    Right handed    No caffeine daily- tea occasionally; eats chocolate    Enjoys- shopping and eating out    Social Determinants of Health   Financial Resource Strain:  Not on file  Food Insecurity: Not on file  Transportation Needs: Not on file  Physical Activity: Not on file  Stress: Not on file  Social Connections: Not on file  Intimate Partner Violence: Not on file    Outpatient Medications Prior to Visit  Medication Sig Dispense Refill  . Calcium Carbonate-Vitamin D (CALCIUM 600+D PO) Take by mouth daily.    . cholecalciferol (VITAMIN D3) 25 MCG (1000 UNIT) tablet Take 1,000 Units by mouth daily.    Marland Kitchen escitalopram (LEXAPRO) 10 MG tablet Take 1 tablet by mouth once daily 90 tablet 0  . vitamin B-12 (CYANOCOBALAMIN) 1000 MCG tablet Take 1,000 mcg by mouth daily.    Marland Kitchen ALPRAZolam (XANAX) 1 MG tablet Take 1 mg by mouth at bedtime as needed for anxiety.    . fluticasone (FLONASE) 50 MCG/ACT nasal spray Place into both nostrils.    Marland Kitchen LORazepam (ATIVAN) 1 MG tablet Take by mouth.     No facility-administered medications prior to visit.    Allergies  Allergen Reactions  . Sertraline Diarrhea    Review of Systems   Constitutional: Negative.   Respiratory: Negative.   Cardiovascular: Negative.   Gastrointestinal: Negative.   Genitourinary: Negative.   Musculoskeletal: Negative.   Skin: Positive for color change and rash. Negative for wound.  Neurological: Negative.   Psychiatric/Behavioral: Negative.        Objective:    Physical Exam Constitutional:      General: She is not in acute distress.    Appearance: Normal appearance. She is normal weight. She is not ill-appearing, toxic-appearing or diaphoretic.  HENT:     Head: Normocephalic and atraumatic.     Right Ear: External ear normal.     Left Ear: External ear normal.     Nose: Nose normal.     Mouth/Throat:     Mouth: Mucous membranes are moist.  Eyes:     General: No scleral icterus.       Right eye: No discharge.        Left eye: No discharge.     Extraocular Movements: Extraocular movements intact.     Conjunctiva/sclera: Conjunctivae normal.     Pupils: Pupils are equal, round, and reactive to light.  Cardiovascular:     Rate and Rhythm: Normal rate and regular rhythm.     Pulses: Normal pulses.     Heart sounds: Normal heart sounds.  Pulmonary:     Effort: Pulmonary effort is normal. No respiratory distress.     Breath sounds: Normal breath sounds. No stridor. No wheezing, rhonchi or rales.  Chest:     Chest wall: No tenderness.  Abdominal:     Palpations: Abdomen is soft.  Musculoskeletal:        General: Normal range of motion.     Cervical back: Normal range of motion and neck supple.  Lymphadenopathy:     Cervical: No cervical adenopathy.  Skin:    General: Skin is warm.     Findings: Erythema and rash present.          Comments: Bilateral arms as on diagram with erythematous mildly weeping tiny vesicles as well as on lower abdomen. No warmth.   Neurological:     Mental Status: She is alert and oriented to person, place, and time.  Psychiatric:        Mood and Affect: Mood normal.        Behavior: Behavior  normal.        Thought Content:  Thought content normal.        Judgment: Judgment normal.     BP 98/60   Pulse 66   Temp (!) 97.1 F (36.2 C) (Oral)   Ht 5\' 9"  (1.753 m)   Wt 151 lb 9.6 oz (68.8 kg)   SpO2 96%   BMI 22.39 kg/m  Wt Readings from Last 3 Encounters:  07/20/20 151 lb 9.6 oz (68.8 kg)  09/19/19 154 lb (69.9 kg)  08/28/19 157 lb (71.2 kg)    Health Maintenance Due  Topic Date Due  . COVID-19 Vaccine (1) Never done  . HIV Screening  Never done  . Hepatitis C Screening  Never done    There are no preventive care reminders to display for this patient.   Lab Results  Component Value Date   TSH 1.69 01/08/2018   Lab Results  Component Value Date   WBC 6.3 01/08/2018   HGB 14.0 01/08/2018   HCT 42.1 01/08/2018   MCV 91.3 01/08/2018   PLT 252.0 01/08/2018   Lab Results  Component Value Date   NA 138 08/12/2019   K 4.2 08/12/2019   CO2 29 08/12/2019   GLUCOSE 80 08/12/2019   BUN 14 08/12/2019   CREATININE 0.73 08/12/2019   BILITOT 0.8 08/12/2019   ALKPHOS 57 08/12/2019   AST 19 08/12/2019   ALT 22 08/12/2019   PROT 7.0 08/12/2019   ALBUMIN 4.5 08/12/2019   CALCIUM 9.5 08/12/2019   ANIONGAP 8 05/30/2017   GFR 84.03 08/12/2019   Lab Results  Component Value Date   CHOL 191 08/12/2019   Lab Results  Component Value Date   HDL 62.40 08/12/2019   Lab Results  Component Value Date   LDLCALC 120 (H) 08/12/2019   Lab Results  Component Value Date   TRIG 44.0 08/12/2019   Lab Results  Component Value Date   CHOLHDL 3 08/12/2019   Lab Results  Component Value Date   HGBA1C 5.5 08/12/2019       Assessment & Plan:   Problem List Items Addressed This Visit      Musculoskeletal and Integument   Poison oak dermatitis   Relevant Medications   triamcinolone ointment (KENALOG) 0.5 %   Tick bite of thigh - Primary   Relevant Medications   doxycycline (VIBRA-TABS) 100 MG tablet     Discussed signs of possible disease with tick bites,  suspect localized skin reaction at site. The # 3 site tick was removed last night #1 and 2 were removed one week ago.   Arms and lower abdomen are consistent with contact dermatitis/ poison oak, she declines oral steroid will try kenalog cream sparingly and use over the counter topical calamine ok.   Zyrtec for itching per package.   Follow up if all symptoms do not resolve or if you have any worsening symptoms at anytime.   Meds ordered this encounter  Medications  . doxycycline (VIBRA-TABS) 100 MG tablet    Sig: Take 1 tablet (100 mg total) by mouth 2 (two) times daily for 14 days.    Dispense:  28 tablet    Refill:  0  . triamcinolone ointment (KENALOG) 0.5 %    Sig: Apply 1 application topically 2 (two) times daily.    Dispense:  30 g    Refill:  0   Return in 1 week (on 07/27/2020), or if symptoms worsen or fail to improve, for at any time for any worsening symptoms, Go to Emergency room/ urgent care if  worse.  Marcille Buffy, FNP

## 2020-07-21 ENCOUNTER — Ambulatory Visit: Payer: BC Managed Care – PPO | Admitting: Adult Health

## 2020-07-26 ENCOUNTER — Telehealth: Payer: Self-pay | Admitting: Family

## 2020-07-26 NOTE — Telephone Encounter (Signed)
Patient was seen a few days ago for tick bite, was treated with ABX but has developed flu like symptoms aching , fever chills, area is red and warm to  Touch according to Access Nurse. Advice of ACCESS nurse was for patient to be seen within 1 hour no appointments available in that time limit, Access Nurse advised UC or ED to patient.

## 2020-07-27 NOTE — Telephone Encounter (Signed)
I cannot see assess nurse triage notes  Please call pt and ensure that she did go to uc or ed

## 2020-07-27 NOTE — Telephone Encounter (Signed)
LMTCB

## 2020-08-24 ENCOUNTER — Other Ambulatory Visit: Payer: Self-pay | Admitting: Family

## 2020-08-24 DIAGNOSIS — F419 Anxiety disorder, unspecified: Secondary | ICD-10-CM

## 2020-09-20 ENCOUNTER — Telehealth: Payer: Self-pay | Admitting: Family

## 2020-09-20 NOTE — Telephone Encounter (Signed)
I called and advised patient that we do not do this on office. She was at ENT Dr. Darien Ramus office & they can do by blood. She will have drawn there.

## 2020-09-20 NOTE — Telephone Encounter (Signed)
PT called to advise that they want to know if we do labs or have the bottle solution for testing for lactose intolerant

## 2020-09-21 ENCOUNTER — Other Ambulatory Visit
Admission: RE | Admit: 2020-09-21 | Discharge: 2020-09-21 | Disposition: A | Discharge status: Home / Self Care | Payer: BC Managed Care – PPO | Attending: Otolaryngology | Admitting: Otolaryngology

## 2020-09-21 DIAGNOSIS — J301 Allergic rhinitis due to pollen: Secondary | ICD-10-CM | POA: Insufficient documentation

## 2020-09-28 ENCOUNTER — Other Ambulatory Visit: Payer: Self-pay

## 2020-09-28 ENCOUNTER — Encounter: Payer: Self-pay | Admitting: Family

## 2020-09-28 ENCOUNTER — Ambulatory Visit (INDEPENDENT_AMBULATORY_CARE_PROVIDER_SITE_OTHER): Payer: BC Managed Care – PPO | Admitting: Family

## 2020-09-28 VITALS — BP 102/62 | HR 56 | Temp 98.2°F | Ht 69.02 in | Wt 147.0 lb

## 2020-09-28 DIAGNOSIS — Z0000 Encounter for general adult medical examination without abnormal findings: Secondary | ICD-10-CM

## 2020-09-28 LAB — CBC WITH DIFFERENTIAL/PLATELET
Basophils Absolute: 0 10*3/uL (ref 0.0–0.1)
Basophils Relative: 0.7 % (ref 0.0–3.0)
Eosinophils Absolute: 0.1 10*3/uL (ref 0.0–0.7)
Eosinophils Relative: 2.5 % (ref 0.0–5.0)
HCT: 43.1 % (ref 36.0–46.0)
Hemoglobin: 14.2 g/dL (ref 12.0–15.0)
Lymphocytes Relative: 31.7 % (ref 12.0–46.0)
Lymphs Abs: 1.3 10*3/uL (ref 0.7–4.0)
MCHC: 32.9 g/dL (ref 30.0–36.0)
MCV: 89.4 fl (ref 78.0–100.0)
Monocytes Absolute: 0.4 10*3/uL (ref 0.1–1.0)
Monocytes Relative: 10.1 % (ref 3.0–12.0)
Neutro Abs: 2.2 10*3/uL (ref 1.4–7.7)
Neutrophils Relative %: 55 % (ref 43.0–77.0)
Platelets: 208 10*3/uL (ref 150.0–400.0)
RBC: 4.82 Mil/uL (ref 3.87–5.11)
RDW: 13.5 % (ref 11.5–15.5)
WBC: 4.1 10*3/uL (ref 4.0–10.5)

## 2020-09-28 LAB — COMPREHENSIVE METABOLIC PANEL
ALT: 11 U/L (ref 0–35)
AST: 14 U/L (ref 0–37)
Albumin: 4.3 g/dL (ref 3.5–5.2)
Alkaline Phosphatase: 61 U/L (ref 39–117)
BUN: 11 mg/dL (ref 6–23)
CO2: 26 mEq/L (ref 19–32)
Calcium: 9 mg/dL (ref 8.4–10.5)
Chloride: 104 mEq/L (ref 96–112)
Creatinine, Ser: 0.64 mg/dL (ref 0.40–1.20)
GFR: 101.77 mL/min (ref >60.00)
Glucose, Bld: 83 mg/dL (ref 70–99)
Potassium: 4 mEq/L (ref 3.5–5.1)
Sodium: 137 mEq/L (ref 135–145)
Total Bilirubin: 0.7 mg/dL (ref 0.2–1.2)
Total Protein: 6.9 g/dL (ref 6.0–8.3)

## 2020-09-28 LAB — ALLERGEN PANEL (27) + IGE
Alternaria Alternata IgE: <0.1 kU/L
Aspergillus Fumigatus IgE: <0.1 kU/L
Bahia Grass IgE: <0.1 kU/L
Bermuda Grass IgE: <0.1 kU/L
Cat Dander IgE: <0.1 kU/L
Cedar, Mountain IgE: <0.1 kU/L
Cladosporium Herbarum IgE: <0.1 kU/L
Cocklebur IgE: <0.1 kU/L
Cockroach, American IgE: <0.1 kU/L
Common Silver Birch IgE: <0.1 kU/L
D Farinae IgE: <0.1 kU/L
D Pteronyssinus IgE: <0.1 kU/L
Dog Dander IgE: <0.1 kU/L
Elm, American IgE: <0.1 kU/L
Hickory, White IgE: <0.1 kU/L
IgE (Immunoglobulin E), Serum: 8 IU/mL (ref 6–495)
Johnson Grass IgE: <0.1 kU/L
Kentucky Bluegrass IgE: <0.1 kU/L
Maple/Box Elder IgE: <0.1 kU/L
Mucor Racemosus IgE: <0.1 kU/L
Oak, White IgE: <0.1 kU/L
Penicillium Chrysogen IgE: <0.1 kU/L
Pigweed, Rough IgE: <0.1 kU/L
Plantain, English IgE: <0.1 kU/L
Ragweed, Short IgE: <0.1 kU/L
Setomelanomma Rostrat: <0.1 kU/L
Timothy Grass IgE: <0.1 kU/L
White Mulberry IgE: <0.1 kU/L

## 2020-09-28 LAB — B12 AND FOLATE PANEL
Folate: 8 ng/mL (ref >5.9)
Vitamin B-12: 928 pg/mL — ABNORMAL HIGH (ref 211–911)

## 2020-09-28 LAB — VITAMIN D 25 HYDROXY (VIT D DEFICIENCY, FRACTURES): VITD: 42.01 ng/mL (ref 30.00–100.00)

## 2020-09-28 LAB — LIPID PANEL
Cholesterol: 187 mg/dL (ref 0–200)
HDL: 67.3 mg/dL (ref >39.00)
LDL Cholesterol: 107 mg/dL — ABNORMAL HIGH (ref 0–99)
NonHDL: 120.1
Total CHOL/HDL Ratio: 3
Triglycerides: 66 mg/dL (ref 0.0–149.0)
VLDL: 13.2 mg/dL (ref 0.0–40.0)

## 2020-09-28 LAB — TSH: TSH: 1.89 u[IU]/mL (ref 0.35–5.50)

## 2020-09-28 LAB — HEMOGLOBIN A1C: Hgb A1c MFr Bld: 5.5 % (ref 4.6–6.5)

## 2020-09-28 NOTE — Progress Notes (Signed)
Subjective:    Patient ID: Margaret Mcfarland, female    DOB: March 16, 1970, 50 y.o.   MRN: [id number]  CC: Margaret Mcfarland is a 50 y.o. female who presents today for physical exam.    HPI: HPI Accompanied today by her Sister , Margaret Mcfarland well today No complaints   Colorectal Cancer Screening: Performed Dr. Vira Mcfarland 2016, and chart colonoscopy report reports repeat in 5 years.She has consultation with Dr Margaret Mcfarland. No constipation, abdominal pain.  Breast Cancer Screening: Mammogram overdue Cervical Cancer Screening: Due, transformation zone absent 2 years ago.  Normal cytology.  She has upcoming appointment with GYN in December, Dr. Leafy Mcfarland.  She denies pelvic pain, abdominal distention.  Her menses are becoming further and further apart.  Last menses was 3 and half months ago.  Bone Health screening/DEXA for 65+: No increased fracture risk. Defer screening at this time  Lung Cancer Screening: Doesn't have 20 year pack year history and age > 62 years yo 88 years          Tetanus - UTD        Pneumococcal - she is not a candidate for. Hepatitis C screening - Candidate for, consents HIV Screening- Candidate for , consents Labs: Screening labs today. Exercise: No regular exercise.  She does do house and yard work.  She denies shortness of breath, chest pain Alcohol use:  none Smoking/tobacco use: Nonsmoker.     HISTORY:  Past Medical History:  Diagnosis Date   Anxiety    Blood in stool    Cataract    Chicken pox    Endometriosis of uterus 05/23/1993   LAP   Genital warts    Kidney disease    per pt medical screening form   Kidney stone    Motion sickness    cars   PONV (postoperative nausea and vomiting)    nausea only   Retinal detachment    Sciatica of left side     Past Surgical History:  Procedure Laterality Date   CATARACT EXTRACTION W/PHACO Left 06/26/2017   Procedure: CATARACT EXTRACTION PHACO AND INTRAOCULAR LENS PLACEMENT ([place]) LEFT;  Surgeon: Leandrew Koyanagi,  MD;  Location: [facility];  Service: Ophthalmology;  Laterality: Left;   COLONOSCOPY N/A 08/25/2014   Procedure: COLONOSCOPY;  Surgeon: Manya Silvas, MD;  Location: [facility] ENDOSCOPY;  Service: Endoscopy;  Laterality: N/A;   endoscopy     GUM SURGERY  2008   laproscopy     RETINAL DETACHMENT SURGERY     Family History  Problem Relation Age of Onset   Colon cancer Father 91   Arthritis Father    Cancer Father        colon/prostate   Hypertension Father    Arthritis Mother    Hyperlipidemia Mother    Heart disease Mother    Stroke Mother    Hypertension Mother    Kidney disease Mother    Diabetes Mother        Type 1   Congestive Heart Failure Mother    Stroke Maternal Grandmother    Cancer Maternal Grandfather        prostate   Stroke Paternal Grandfather    Breast cancer Cousin 39       has contact      ALLERGIES: Sertraline  Current Outpatient Medications on File Prior to Visit  Medication Sig Dispense Refill   Calcium Carbonate-Vitamin D (CALCIUM 600+D PO) Take by mouth daily.     cholecalciferol (VITAMIN D3) 25 MCG (  1000 UNIT) tablet Take 1,000 Units by mouth daily.     escitalopram (LEXAPRO) 10 MG tablet Take 1 tablet by mouth once daily 90 tablet 0   vitamin B-12 (CYANOCOBALAMIN) 1000 MCG tablet Take 1,000 mcg by mouth daily.     triamcinolone ointment (KENALOG) 0.5 % Apply 1 application topically 2 (two) times daily. (Patient not taking: Reported on 09/28/2020) 30 g 0   No current facility-administered medications on file prior to visit.    Social History   Tobacco Use   Smoking status: Never   Smokeless tobacco: Never  Vaping Use   Vaping Use: Never used  Substance Use Topics   Alcohol use: No    Alcohol/week: 0.0 standard drinks   Drug use: No    Review of Systems  Constitutional:  Negative for chills, fever and unexpected weight change.  HENT:  Negative for congestion.   Respiratory:  Negative for cough.   Cardiovascular:  Negative for  chest pain, palpitations and leg swelling.  Gastrointestinal:  Negative for abdominal distention, abdominal pain, nausea and vomiting.  Genitourinary:  Negative for pelvic pain.  Musculoskeletal:  Negative for arthralgias and myalgias.  Skin:  Negative for rash.  Neurological:  Negative for headaches.  Hematological:  Negative for adenopathy.  Psychiatric/Behavioral:  Negative for confusion.      Objective:    BP 102/62   Pulse (!) 56   Temp 98.2 F (36.8 C)   Ht 5' 9.02" (1.753 m)   Wt 147 lb (66.7 kg)   SpO2 98%   BMI 21.70 kg/m   BP Readings from Last 3 Encounters:  09/28/20 102/62  07/20/20 98/60  09/19/19 102/70   Wt Readings from Last 3 Encounters:  09/28/20 147 lb (66.7 kg)  07/20/20 151 lb 9.6 oz (68.8 kg)  09/19/19 154 lb (69.9 kg)    Physical Exam Vitals reviewed.  Constitutional:      Appearance: Normal appearance. She is well-developed.  Eyes:     Conjunctiva/sclera: Conjunctivae normal.  Neck:     Thyroid: No thyroid mass or thyromegaly.  Cardiovascular:     Rate and Rhythm: Normal rate and regular rhythm.     Pulses: Normal pulses.     Heart sounds: Normal heart sounds.  Pulmonary:     Effort: Pulmonary effort is normal.     Breath sounds: Normal breath sounds. No wheezing, rhonchi or rales.  Abdominal:     General: Bowel sounds are normal. There is no distension.     Palpations: Abdomen is soft. Abdomen is not rigid. There is no fluid wave or mass.     Tenderness: There is no abdominal tenderness. There is no guarding or rebound.  Lymphadenopathy:     Head:     Right side of head: No submental, submandibular, tonsillar, preauricular, posterior auricular or occipital adenopathy.     Left side of head: No submental, submandibular, tonsillar, preauricular, posterior auricular or occipital adenopathy.     Cervical: No cervical adenopathy.  Skin:    General: Skin is warm and dry.  Neurological:     Mental Status: She is alert.  Psychiatric:         Speech: Speech normal.        Behavior: Behavior normal.        Thought Content: Thought content normal.       Assessment & Plan:   Problem List Items Addressed This Visit       Other   Routine physical examination - Primary  Patient declines clinical breast exam today in the office.  She has CPE scheduled with GYN in December of this year and she prefers to wait till then.  She declines pelvic exam in the absence of complaints and also she will establish with GYN. I encouraged her to incorporate a self breast exam monthly at home.  I went ahead and ordered her mammogram although she prefers to wait till she sees her GYN.  I encouraged her to go ahead and schedule this.  Colonoscopy is overdue.  Patient has upcoming appointment with [facility] gastroenterology.  Encouraged to start walking program        Relevant Orders   Hemoglobin A1c   TSH   CBC with Differential/Platelet   Comprehensive metabolic panel   Lipid panel   VITAMIN D 25 Hydroxy (Vit-D Deficiency, Fractures)   MM 3D SCREEN BREAST BILATERAL   B12 and Folate Panel   Hepatitis C antibody   HIV Antibody (routine testing w rflx)     I am having Godfrey Pick maintain her Calcium Carbonate-Vitamin D (CALCIUM 600+D PO), vitamin B-12, cholecalciferol, triamcinolone ointment, and escitalopram.   No orders of the defined types were placed in this encounter.   Return precautions given.   Risks, benefits, and alternatives of the medications and treatment plan prescribed today were discussed, and patient expressed understanding.   Education regarding symptom management and diagnosis given to patient on AVS.   Continue to follow with Burnard Hawthorne, FNP for routine health maintenance.   Godfrey Pick and I agreed with plan.   Mable Paris, FNP

## 2020-09-28 NOTE — Assessment & Plan Note (Signed)
Patient declines clinical breast exam today in the office.  She has CPE scheduled with GYN in December of this year and she prefers to wait till then.  She declines pelvic exam in the absence of complaints and also she will establish with GYN. I encouraged her to incorporate a self breast exam monthly at home.  I went ahead and ordered her mammogram although she prefers to wait till she sees her GYN.  I encouraged her to go ahead and schedule this.  Colonoscopy is overdue.  Patient has upcoming appointment with [facility] gastroenterology.  Encouraged to start walking program

## 2020-09-28 NOTE — Patient Instructions (Addendum)
Start walking program  Please call  and schedule your 3D mammogram as discussed. I would NOT wait    [facility]  [place], [place]   Health Maintenance for Postmenopausal Women Menopause is a normal process in which your ability to get pregnant comes to an end. This process happens slowly over many months or years, usually between the ages of 38 and 77. Menopause is complete when you have missed your menstrualperiods for 12 months. It is important to talk with your health care provider about some of the most common conditions that affect women after menopause (postmenopausal women). These include heart disease, cancer, and bone loss (osteoporosis). Adopting a healthy lifestyle and getting preventive care can help to promote your health and wellness. The actions you take can also lower your chances ofdeveloping some of these common conditions. What should I know about menopause? During menopause, you may get a number of symptoms, such as: Hot flashes. These can be moderate or severe. Night sweats. Decrease in sex drive. Mood swings. Headaches. Tiredness. Irritability. Memory problems. Insomnia. Choosing to treat or not to treat these symptoms is a decision that you makewith your health care provider. Do I need hormone replacement therapy? Hormone replacement therapy is effective in treating symptoms that are caused by menopause, such as hot flashes and night sweats. Hormone replacement carries certain risks, especially as you become older. If you are thinking about using estrogen or estrogen with progestin, discuss the benefits and risks with your health care provider. What is my risk for heart disease and stroke? The risk of heart disease, heart attack, and stroke increases as you age. One of the causes may be a change in the body's hormones during menopause. This can affect how your body uses dietary fats, triglycerides, and  cholesterol. Heart attack and stroke are medical emergencies. There are many things that you cando to help prevent heart disease and stroke. Watch your blood pressure High blood pressure causes heart disease and increases the risk of stroke. This is more likely to develop in people who have high blood pressure readings, are of African descent, or are overweight. Have your blood pressure checked: Every 3-5 years if you are 74-50 years of age. Every year if you are 38 years old or older. Eat a healthy diet  Eat a diet that includes plenty of vegetables, fruits, low-fat dairy products, and lean protein. Do not eat a lot of foods that are high in solid fats, added sugars, or sodium.  Get regular exercise Get regular exercise. This is one of the most important things you can do for your health. Most adults should: Try to exercise for at least 150 minutes each week. The exercise should increase your heart rate and make you sweat (moderate-intensity exercise). Try to do strengthening exercises at least twice each week. Do these in addition to the moderate-intensity exercise. Spend less time sitting. Even light physical activity can be beneficial. Other tips Work with your health care provider to achieve or maintain a healthy weight. Do not use any products that contain nicotine or tobacco, such as cigarettes, e-cigarettes, and chewing tobacco. If you need help quitting, ask your health care provider. Know your numbers. Ask your health care provider to check your cholesterol and your blood sugar (glucose). Continue to have your blood tested as directed by your health care provider. Do I need screening for cancer? Depending on your health history and family history, you may need  to have cancer screening at different stages of your life. This may include screening for: Breast cancer. Cervical cancer. Lung cancer. Colorectal cancer. What is my risk for osteoporosis? After menopause, you may be at  increased risk for osteoporosis. Osteoporosis is a condition in which bone destruction happens more quickly than new bone creation. To help prevent osteoporosis or the bone fractures that can happen because of osteoporosis, you may take the following actions: If you are 81-20 years old, get at least 1,000 mg of calcium and at least 600 mg of vitamin D per day. If you are older than age 54 but younger than age 70, get at least 1,200 mg of calcium and at least 600 mg of vitamin D per day. If you are older than age 98, get at least 1,200 mg of calcium and at least 800 mg of vitamin D per day. Smoking and drinking excessive alcohol increase the risk of osteoporosis. Eat foods that are rich in calcium and vitamin D, and do weight-bearing exercisesseveral times each week as directed by your health care provider. How does menopause affect my mental health? Depression may occur at any age, but it is more common as you become older. Common symptoms of depression include: Low or sad mood. Changes in sleep patterns. Changes in appetite or eating patterns. Feeling an overall lack of motivation or enjoyment of activities that you previously enjoyed. Frequent crying spells. Talk with your health care provider if you think that you are experiencingdepression. General instructions See your health care provider for regular wellness exams and vaccines. This may include: Scheduling regular health, dental, and eye exams. Getting and maintaining your vaccines. These include: Influenza vaccine. Get this vaccine each year before the flu season begins. Pneumonia vaccine. Shingles vaccine. Tetanus, diphtheria, and pertussis (Tdap) booster vaccine. Your health care provider may also recommend other immunizations. Tell your health care provider if you have ever been abused or do not feel safeat home. Summary Menopause is a normal process in which your ability to get pregnant comes to an end. This condition causes hot  flashes, night sweats, decreased interest in sex, mood swings, headaches, or lack of sleep. Treatment for this condition may include hormone replacement therapy. Take actions to keep yourself healthy, including exercising regularly, eating a healthy diet, watching your weight, and checking your blood pressure and blood sugar levels. Get screened for cancer and depression. Make sure that you are up to date with all your vaccines. This information is not intended to replace advice given to you by your health care provider. Make sure you discuss any questions you have with your healthcare provider. Document Revised: 02/12/2018 Document Reviewed: 02/12/2018 Elsevier Patient Education  2022 [employer].

## 2020-09-29 LAB — HEPATITIS C ANTIBODY
Hepatitis C Ab: NONREACTIVE
SIGNAL TO CUT-OFF: 0.03 (ref <1.00)

## 2020-09-29 LAB — HIV ANTIBODY (ROUTINE TESTING W REFLEX): HIV 1&2 Ab, 4th Generation: NONREACTIVE

## 2020-09-30 ENCOUNTER — Other Ambulatory Visit: Payer: Self-pay | Admitting: Family

## 2020-09-30 DIAGNOSIS — E538 Deficiency of other specified B group vitamins: Secondary | ICD-10-CM

## 2020-10-04 LAB — ALLERGENS(10) FOODS
F245-Ige Egg, Whole: <0.1 kU/L
Milk IgE: <0.1 kU/L
Peanut IgE: <0.1 kU/L
Soybean IgE: <0.1 kU/L

## 2020-10-13 ENCOUNTER — Ambulatory Visit (INDEPENDENT_AMBULATORY_CARE_PROVIDER_SITE_OTHER): Payer: BC Managed Care – PPO | Admitting: Vascular Surgery

## 2020-10-16 NOTE — Progress Notes (Signed)
MRN : [id number]  Margaret Mcfarland is a 50 y.o. (Dec 31, 1970) female who presents with chief complaint of leg swelling.  History of Present Illness:    The patient returns to the office for followup evaluation regarding leg swelling.  The swelling has persisted but with  her compression socks and the lymph pump the patient states the swelling is much better controlled. The pain associated with swelling is essentially eliminated. There have not been any interval development of a ulcerations or wounds.  No episodes of cellulitis or infection over the past 12 months  The patient denies problems with the pump, noting it is working well and the leggings are in good condition.  Since the previous visit the patient has been wearing graduated compression stockings and using the lymph pump on a routine basis and  has noted significant improvement in the lymphedema.   Patient stated the lymph pump has been a very positive factor in her care.     No outpatient medications have been marked as taking for the 10/17/20 encounter (Appointment) with Delana Meyer, Dolores Lory, MD.    Past Medical History:  Diagnosis Date   Anxiety    Blood in stool    Cataract    Chicken pox    Endometriosis of uterus 05/23/1993   LAP   Genital warts    Kidney disease    per pt medical screening form   Kidney stone    Motion sickness    cars   PONV (postoperative nausea and vomiting)    nausea only   Retinal detachment    Sciatica of left side     Past Surgical History:  Procedure Laterality Date   CATARACT EXTRACTION W/PHACO Left 06/26/2017   Procedure: CATARACT EXTRACTION PHACO AND INTRAOCULAR LENS PLACEMENT ([place]) LEFT;  Surgeon: Leandrew Koyanagi, MD;  Location: [facility];  Service: Ophthalmology;  Laterality: Left;   COLONOSCOPY N/A 08/25/2014   Procedure: COLONOSCOPY;  Surgeon: Manya Silvas, MD;  Location: [facility] ENDOSCOPY;  Service: Endoscopy;  Laterality: N/A;   endoscopy     GUM SURGERY  2008    laproscopy     RETINAL DETACHMENT SURGERY      Social History Social History   Tobacco Use   Smoking status: Never   Smokeless tobacco: Never  Vaping Use   Vaping Use: Never used  Substance Use Topics   Alcohol use: No    Alcohol/week: 0.0 standard drinks   Drug use: No    Family History Family History  Problem Relation Age of Onset   Colon cancer Father 6   Arthritis Father    Cancer Father        colon/prostate   Hypertension Father    Arthritis Mother    Hyperlipidemia Mother    Heart disease Mother    Stroke Mother    Hypertension Mother    Kidney disease Mother    Diabetes Mother        Type 1   Congestive Heart Failure Mother    Stroke Maternal Grandmother    Cancer Maternal Grandfather        prostate   Stroke Paternal Grandfather    Breast cancer Cousin 15       has contact    Allergies  Allergen Reactions   Sertraline Diarrhea     REVIEW OF SYSTEMS (Negative unless checked)  Constitutional: '[]'$ Weight loss  '[]'$ Fever  '[]'$ Chills Cardiac: '[]'$ Chest pain   '[]'$ Chest pressure   '[]'$ Palpitations   '[]'$ Shortness of breath when  laying flat   '[]'$ Shortness of breath with exertion. Vascular:  '[]'$ Pain in legs with walking   '[]'$ Pain in legs at rest  '[]'$ History of DVT   '[]'$ Phlebitis   '[x]'$ Swelling in legs   '[]'$ Varicose veins   '[]'$ Non-healing ulcers Pulmonary:   '[]'$ Uses home oxygen   '[]'$ Productive cough   '[]'$ Hemoptysis   '[]'$ Wheeze  '[]'$ COPD   '[]'$ Asthma Neurologic:  '[]'$ Dizziness   '[]'$ Seizures   '[]'$ History of stroke   '[]'$ History of TIA  '[]'$ Aphasia   '[]'$ Vissual changes   '[]'$ Weakness or numbness in arm   '[]'$ Weakness or numbness in leg Musculoskeletal:   '[]'$ Joint swelling   '[]'$ Joint pain   '[]'$ Low back pain Hematologic:  '[]'$ Easy bruising  '[]'$ Easy bleeding   '[]'$ Hypercoagulable state   '[]'$ Anemic Gastrointestinal:  '[]'$ Diarrhea   '[]'$ Vomiting  '[]'$ Gastroesophageal reflux/heartburn   '[]'$ Difficulty swallowing. Genitourinary:  '[]'$ Chronic kidney disease   '[]'$ Difficult urination  '[]'$ Frequent urination   '[]'$ Blood in urine Skin:   '[]'$ Rashes   '[]'$ Ulcers  Psychological:  '[]'$ History of anxiety   '[]'$  History of major depression.  Physical Examination  There were no vitals filed for this visit. There is no height or weight on file to calculate BMI. Gen: WD/WN, NAD Head: [place]/AT, No temporalis wasting.  Ear/Nose/Throat: Hearing grossly intact, nares w/o erythema or drainage, pinna without lesions Eyes: PER, EOMI, sclera nonicteric.  Neck: Supple, no gross masses.  No JVD.  Pulmonary:  Good air movement, no audible wheezing, no use of accessory muscles.  Cardiac: RRR, precordium not hyperdynamic. Vascular:  scattered varicosities present bilaterally.  Mild venous stasis changes to the legs bilaterally.  1-2+ soft pitting edema  Vessel Right Left  Radial Palpable Palpable  Gastrointestinal: soft, non-distended. No guarding/no peritoneal signs.  Musculoskeletal: M/S 5/5 throughout.  No deformity.  Neurologic: CN 2-12 intact. Pain and light touch intact in extremities.  Symmetrical.  Speech is fluent. Motor exam as listed above. Psychiatric: Judgment intact, Mood & affect appropriate for pt's clinical situation. Dermatologic: Mild venous rashes no ulcers noted.  No changes consistent with cellulitis. Lymph : No lichenification or skin changes of chronic lymphedema.  CBC Lab Results  Component Value Date   WBC 4.1 09/28/2020   HGB 14.2 09/28/2020   HCT 43.1 09/28/2020   MCV 89.4 09/28/2020   PLT 208.0 09/28/2020    BMET    Component Value Date/Time   NA 137 09/28/2020 0907   K 4.0 09/28/2020 0907   CL 104 09/28/2020 0907   CO2 26 09/28/2020 0907   GLUCOSE 83 09/28/2020 0907   BUN 11 09/28/2020 0907   CREATININE 0.64 09/28/2020 0907   CREATININE 0.73 07/01/2015 1527   CALCIUM 9.0 09/28/2020 0907   GFRNONAA >60 05/30/2017 1840   GFRAA >60 05/30/2017 1840   CrCl cannot be calculated (Unknown ideal weight.).  COAG Lab Results  Component Value Date   INR 1.05 07/01/2015    Radiology No results  found.   Assessment/Plan 1. Lymphedema  No surgery or intervention at this point in time.    I have reviewed my discussion with the patient regarding lymphedema and why it  causes symptoms.  Patient will continue wearing graduated compression stockings class 1 (20-30 mmHg) on a daily basis a prescription was given. The patient is reminded to put the stockings on first thing in the morning and removing them in the evening. The patient is instructed specifically not to sleep in the stockings.   In addition, behavioral modification throughout the day will be continued.  This will include frequent elevation (such  as in a recliner), use of over the counter pain medications as needed and exercise such as walking.  I have reviewed systemic causes for chronic edema such as liver, kidney and cardiac etiologies and there does not appear to be any significant changes in these organ systems over the past year.  The patient is under the impression that these organ systems are all stable and unchanged.    The patient will continue aggressive use of the  lymph pump.  This will continue to improve the edema control and prevent sequela such as ulcers and infections.   The patient will follow-up with me on an annual basis.    2. Chronic venous insufficiency  No surgery or intervention at this point in time.    I have reviewed my discussion with the patient regarding lymphedema and why it  causes symptoms.  Patient will continue wearing graduated compression stockings class 1 (20-30 mmHg) on a daily basis a prescription was given. The patient is reminded to put the stockings on first thing in the morning and removing them in the evening. The patient is instructed specifically not to sleep in the stockings.   In addition, behavioral modification throughout the day will be continued.  This will include frequent elevation (such as in a recliner), use of over the counter pain medications as needed and exercise such  as walking.  I have reviewed systemic causes for chronic edema such as liver, kidney and cardiac etiologies and there does not appear to be any significant changes in these organ systems over the past year.  The patient is under the impression that these organ systems are all stable and unchanged.    The patient will continue aggressive use of the  lymph pump.  This will continue to improve the edema control and prevent sequela such as ulcers and infections.   The patient will follow-up with me on an annual basis.    3. Hyperlipidemia, unspecified hyperlipidemia type Continue statin as ordered and reviewed, no changes at this time    Hortencia Pilar, MD  10/16/2020 2:45 PM

## 2020-10-17 ENCOUNTER — Encounter (INDEPENDENT_AMBULATORY_CARE_PROVIDER_SITE_OTHER): Payer: Self-pay | Admitting: Vascular Surgery

## 2020-10-17 ENCOUNTER — Ambulatory Visit (INDEPENDENT_AMBULATORY_CARE_PROVIDER_SITE_OTHER): Payer: BC Managed Care – PPO | Admitting: Vascular Surgery

## 2020-10-17 ENCOUNTER — Other Ambulatory Visit: Payer: Self-pay

## 2020-10-17 VITALS — BP 105/65 | HR 52 | Resp 16 | Wt 146.6 lb

## 2020-10-17 DIAGNOSIS — I872 Venous insufficiency (chronic) (peripheral): Secondary | ICD-10-CM

## 2020-10-17 DIAGNOSIS — E785 Hyperlipidemia, unspecified: Secondary | ICD-10-CM

## 2020-10-17 DIAGNOSIS — I890 Lymphedema, not elsewhere classified: Secondary | ICD-10-CM

## 2020-10-24 ENCOUNTER — Encounter: Payer: Self-pay | Admitting: Family

## 2020-10-27 ENCOUNTER — Ambulatory Visit (INDEPENDENT_AMBULATORY_CARE_PROVIDER_SITE_OTHER): Payer: BC Managed Care – PPO | Admitting: Vascular Surgery

## 2020-11-01 ENCOUNTER — Encounter: Payer: BC Managed Care – PPO | Admitting: Obstetrics and Gynecology

## 2020-11-11 ENCOUNTER — Other Ambulatory Visit: Payer: BC Managed Care – PPO

## 2020-11-11 ENCOUNTER — Other Ambulatory Visit (INDEPENDENT_AMBULATORY_CARE_PROVIDER_SITE_OTHER): Payer: BC Managed Care – PPO

## 2020-11-11 ENCOUNTER — Other Ambulatory Visit: Payer: Self-pay

## 2020-11-11 DIAGNOSIS — E538 Deficiency of other specified B group vitamins: Secondary | ICD-10-CM | POA: Diagnosis not present

## 2020-11-15 LAB — METHYLMALONIC ACID, SERUM: Methylmalonic Acid, Quant: 114 nmol/L (ref 87–318)

## 2020-11-15 LAB — HOMOCYSTEINE: Homocysteine: 8.5 umol/L (ref <10.4)

## 2020-11-15 LAB — CELIAC DISEASE AB SCREEN W/RFX
Antigliadin Abs, IgA: 2 units (ref 0–19)
IgA/Immunoglobulin A, Serum: 98 mg/dL (ref 87–352)
Transglutaminase IgA: <2 U/mL (ref 0–3)

## 2020-11-15 LAB — INTRINSIC FACTOR ANTIBODIES: Intrinsic Factor: NEGATIVE

## 2020-11-15 LAB — ANTI-PARIETAL ANTIBODY: PARIETAL CELL AB SCREEN: NEGATIVE

## 2020-11-21 ENCOUNTER — Telehealth: Payer: Self-pay | Admitting: Family

## 2020-11-21 NOTE — Telephone Encounter (Signed)
Patient called in stating that she has a balance of $150.00 and her insurance company says that her provider has request a review for the primary diagnosis code she was seen for.Please advise and call the patient.

## 2020-11-28 ENCOUNTER — Other Ambulatory Visit: Payer: Self-pay | Admitting: Family

## 2020-11-28 DIAGNOSIS — F419 Anxiety disorder, unspecified: Secondary | ICD-10-CM

## 2020-11-29 NOTE — Telephone Encounter (Signed)
LMTCB

## 2020-12-07 ENCOUNTER — Ambulatory Visit: Payer: BC Managed Care – PPO | Admitting: Podiatry

## 2020-12-28 ENCOUNTER — Ambulatory Visit: Payer: BC Managed Care – PPO | Admitting: Podiatry

## 2021-01-02 ENCOUNTER — Encounter: Payer: Self-pay | Admitting: Family

## 2021-01-02 ENCOUNTER — Telehealth: Payer: Self-pay

## 2021-01-02 NOTE — Telephone Encounter (Signed)
I spoke with patient & let her know I have no idea how to correct- this for her. She gave me number for insurance & stated that I would call when I was not rooming patient's to see what needs to be done [phone].

## 2021-01-02 NOTE — Telephone Encounter (Signed)
Close  

## 2021-01-10 ENCOUNTER — Encounter: Payer: Self-pay | Admitting: Family

## 2021-01-11 ENCOUNTER — Ambulatory Visit: Payer: Self-pay | Admitting: Urology

## 2021-01-18 ENCOUNTER — Other Ambulatory Visit: Payer: Self-pay | Admitting: *Deleted

## 2021-01-18 DIAGNOSIS — N200 Calculus of kidney: Secondary | ICD-10-CM

## 2021-01-23 ENCOUNTER — Other Ambulatory Visit: Payer: Self-pay

## 2021-01-23 ENCOUNTER — Ambulatory Visit: Payer: BC Managed Care – PPO | Admitting: Podiatry

## 2021-01-23 DIAGNOSIS — M722 Plantar fascial fibromatosis: Secondary | ICD-10-CM | POA: Diagnosis not present

## 2021-01-23 DIAGNOSIS — Q828 Other specified congenital malformations of skin: Secondary | ICD-10-CM | POA: Diagnosis not present

## 2021-01-23 NOTE — Progress Notes (Signed)
She presents today chief complaint of a painful lesion to the plantar medial aspect of the hallux interphalangeal joint left foot.  It is gotten more painful now than it was before wants to know if there is anything nonsurgical that can be done.  She is also like to consider a night splint for her mild plantar fasciitis.  Objective: Vital signs are stable alert and oriented x3.  Pulses are palpable.  Reactive hyperkeratotic benign skin lesion plantar medial aspect of the hallux interphalangeal joint left foot sharply debrided today.  Mild tenderness on palpation medial calcaneal tubercle.  Assessment: Pain in limb secondary to benign skin lesion hallux interphalangeal joint plantar medial aspect left.  Also plantar fasciitis.  Plan: Dispensed a night splint for the left foot.  I also debrided the benign skin lesion.  Follow-up with her as needed

## 2021-01-24 NOTE — Progress Notes (Signed)
01/25/21 1:45 PM   Godfrey Pick [birth year] [phone]  Referring provider:  Burnard Hawthorne, FNP [address] [place],  [place] [address] Chief Complaint  Patient presents with   Nephrolithiasis     HPI: Margaret Mcfarland is a 50 y.o.female with a personal history of punctate kidney stone who presents today for 2 year follow-up with KUB.   She was last seen in office on 12/30/2018. 01/06/2019 RUS revealed nonobstructing 5 mm calculus mid left kidney. Study otherwise unremarkable.  KUB revealed stable nonobstructing stone and stone embedded in kidney tissue.   She reports today that she has been experiencing stress incontinence when she laughs, coughs, and sneezes. She has no other urinary symptoms today.    PMH: Past Medical History:  Diagnosis Date   Anxiety    Blood in stool    Cataract    Chicken pox    Endometriosis of uterus 05/23/1993   LAP   Genital warts    Kidney disease    per pt medical screening form   Kidney stone    Motion sickness    cars   PONV (postoperative nausea and vomiting)    nausea only   Retinal detachment    Sciatica of left side     Surgical History: Past Surgical History:  Procedure Laterality Date   CATARACT EXTRACTION W/PHACO Left 06/26/2017   Procedure: CATARACT EXTRACTION PHACO AND INTRAOCULAR LENS PLACEMENT ([place]) LEFT;  Surgeon: Leandrew Koyanagi, MD;  Location: [facility] [facility];  Service: Ophthalmology;  Laterality: Left;   COLONOSCOPY N/A 08/25/2014   Procedure: COLONOSCOPY;  Surgeon: Manya Silvas, MD;  Location: [facility] ENDOSCOPY;  Service: Endoscopy;  Laterality: N/A;   endoscopy     GUM SURGERY  2008   laproscopy     RETINAL DETACHMENT SURGERY      Home Medications:  Allergies as of 01/25/2021       Reactions   Sertraline Diarrhea        Medication List        Accurate as of January 25, 2021  1:45 PM. If you have any questions, ask your nurse or doctor.          STOP taking these  medications    cholecalciferol 25 MCG (1000 UNIT) tablet Commonly known as: VITAMIN D3 Stopped by: Hollice Espy, MD   vitamin B-12 1000 MCG tablet Commonly known as: CYANOCOBALAMIN Stopped by: Hollice Espy, MD       TAKE these medications    CALCIUM 600+D PO Take by mouth daily.   escitalopram 10 MG tablet Commonly known as: LEXAPRO Take 1 tablet by mouth once daily   Multi-Vitamin tablet Take 1 tablet by mouth daily.   ZINC PO Take by mouth daily.        Allergies:  Allergies  Allergen Reactions   Sertraline Diarrhea    Family History: Family History  Problem Relation Age of Onset   Colon cancer Father 55   Arthritis Father    Cancer Father        colon/prostate   Hypertension Father    Arthritis Mother    Hyperlipidemia Mother    Heart disease Mother    Stroke Mother    Hypertension Mother    Kidney disease Mother    Diabetes Mother        Type 1   Congestive Heart Failure Mother    Stroke Maternal Grandmother    Cancer Maternal Grandfather        prostate  Stroke Paternal Grandfather    Breast cancer Cousin 48       has contact    Social History:  reports that she has never smoked. She has never used smokeless tobacco. She reports that she does not drink alcohol and does not use drugs.   Physical Exam: BP 136/84   Pulse 82   Ht 5\' 9"  (1.753 m)   Wt 146 lb (66.2 kg)   BMI 21.56 kg/m   Constitutional:  Alert and oriented, No acute distress. HEENT: [place] AT, moist mucus membranes.  Trachea midline, no masses. Cardiovascular: No clubbing, cyanosis, or edema. Respiratory: Normal respiratory effort, no increased work of breathing. Skin: No rashes, bruises or suspicious lesions. Neurologic: Grossly intact, no focal deficits, moving all 4 extremities. Psychiatric: Normal mood and affect.  Laboratory Data:  Lab Results  Component Value Date   CREATININE 0.64 09/28/2020    Pertinent Imaging: Punctate stone on left lower pole look to  be randall's plaque  I have personally reviewed the images and agree with radiologist interpretation.   Assessment & Plan:    Left kidney stone  - Nonobstructing and stable   - Do not recommend further treatment or imagine for this unless she experiences stone symptoms that worsen or do not improve. She is to follow-up as needed.   2. Stress incontinence  - Mild   - Discussed Kegel exercises and the importance of maintaining pelvic floor muscles.   - Discussed surgical intervention. She is not interested in a procedure at this time.   Return if symptoms worsen or fail to improve.  I,Kailey Littlejohn,acting as a Education administrator for Hollice Espy, MD.,have documented all relevant documentation on the behalf of Hollice Espy, MD,as directed by  Hollice Espy, MD while in the presence of Hollice Espy, MD.  I have reviewed the above documentation for accuracy and completeness, and I agree with the above.   Hollice Espy, MD  [facility] [address] [place], [place] [address] [phone]

## 2021-01-25 ENCOUNTER — Encounter: Payer: Self-pay | Admitting: Urology

## 2021-01-25 ENCOUNTER — Ambulatory Visit
Admission: RE | Admit: 2021-01-25 | Discharge: 2021-01-25 | Disposition: A | Discharge status: Home / Self Care | Payer: BC Managed Care – PPO | Source: Ambulatory Visit | Attending: Urology | Admitting: Urology

## 2021-01-25 ENCOUNTER — Other Ambulatory Visit: Payer: Self-pay

## 2021-01-25 ENCOUNTER — Ambulatory Visit: Payer: BC Managed Care – PPO | Admitting: Urology

## 2021-01-25 ENCOUNTER — Ambulatory Visit
Admission: RE | Admit: 2021-01-25 | Discharge: 2021-01-25 | Disposition: A | Discharge status: Home / Self Care | Payer: BC Managed Care – PPO | Attending: Urology | Admitting: Urology

## 2021-01-25 VITALS — BP 136/84 | HR 82 | Ht 69.0 in | Wt 146.0 lb

## 2021-01-25 DIAGNOSIS — N200 Calculus of kidney: Secondary | ICD-10-CM

## 2021-01-25 DIAGNOSIS — N393 Stress incontinence (female) (male): Secondary | ICD-10-CM | POA: Diagnosis not present

## 2021-01-25 NOTE — Patient Instructions (Signed)
Kegel Exercises Kegel exercises can help strengthen your pelvic floor muscles. The pelvic floor is a group of muscles that support your rectum, small intestine, and bladder. In females, pelvic floor muscles also help support the uterus. These muscles help you control the flow of urine and stool (feces). Kegel exercises are painless and simple. They do not require any equipment. Your provider may suggest Kegel exercises to: Improve bladder and bowel control. Improve sexual response. Improve weak pelvic floor muscles after surgery to remove the uterus (hysterectomy) or after pregnancy, in females. Improve weak pelvic floor muscles after prostate gland removal or surgery, in males. Kegel exercises involve squeezing your pelvic floor muscles. These are the same muscles you squeeze when you try to stop the flow of urine or keep from passing gas. The exercises can be done while sitting, standing, or lying down, but it is best to vary your position. Ask your health care provider which exercises are safe for you. Do exercises exactly as told by your health care provider and adjust them as directed. Do not begin these exercises until told by your health care provider. Exercises How to do Kegel exercises: Squeeze your pelvic floor muscles tight. You should feel a tight lift in your rectal area. If you are a female, you should also feel a tightness in your vaginal area. Keep your stomach, buttocks, and legs relaxed. Hold the muscles tight for up to 10 seconds. Breathe normally. Relax your muscles for up to 10 seconds. Repeat as told by your health care provider. Repeat this exercise daily as told by your health care provider. Continue to do this exercise for at least 4-6 weeks, or for as long as told by your health care provider. You may be referred to a physical therapist who can help you learn more about how to do Kegel exercises. Depending on your condition, your health care provider may  recommend: Varying how long you squeeze your muscles. Doing several sets of exercises every day. Doing exercises for several weeks. Making Kegel exercises a part of your regular exercise routine. This information is not intended to replace advice given to you by your health care provider. Make sure you discuss any questions you have with your health care provider. Document Revised: 06/30/2020 Document Reviewed: 06/30/2020 Elsevier Patient Education  2022 [employer].

## 2021-02-28 ENCOUNTER — Other Ambulatory Visit: Payer: Self-pay | Admitting: Family

## 2021-02-28 DIAGNOSIS — F419 Anxiety disorder, unspecified: Secondary | ICD-10-CM

## 2021-03-08 ENCOUNTER — Other Ambulatory Visit: Payer: Self-pay | Admitting: Obstetrics and Gynecology

## 2021-03-08 ENCOUNTER — Other Ambulatory Visit: Payer: Self-pay | Admitting: Family

## 2021-03-08 DIAGNOSIS — Z1231 Encounter for screening mammogram for malignant neoplasm of breast: Secondary | ICD-10-CM

## 2021-03-21 ENCOUNTER — Encounter: Payer: Self-pay | Admitting: Adult Health

## 2021-03-21 DIAGNOSIS — S0292XS Unspecified fracture of facial bones, sequela: Secondary | ICD-10-CM

## 2021-03-22 DIAGNOSIS — S0292XA Unspecified fracture of facial bones, initial encounter for closed fracture: Secondary | ICD-10-CM | POA: Insufficient documentation

## 2021-03-22 NOTE — Addendum Note (Signed)
Addended by: Doreen Beam on: 03/22/2021 10:55 PM   Modules accepted: Orders

## 2021-03-22 NOTE — Addendum Note (Signed)
Addended by: Doreen Beam on: 03/22/2021 07:54 AM   Modules accepted: Orders

## 2021-04-10 ENCOUNTER — Encounter: Payer: Self-pay | Admitting: Family

## 2021-04-12 ENCOUNTER — Ambulatory Visit: Payer: BC Managed Care – PPO | Admitting: Adult Health

## 2021-05-08 ENCOUNTER — Ambulatory Visit
Admission: RE | Admit: 2021-05-08 | Discharge: 2021-05-08 | Disposition: A | Discharge status: Home / Self Care | Payer: BC Managed Care – PPO | Source: Ambulatory Visit | Attending: Obstetrics and Gynecology | Admitting: Obstetrics and Gynecology

## 2021-05-08 ENCOUNTER — Other Ambulatory Visit: Payer: Self-pay

## 2021-05-08 DIAGNOSIS — Z1231 Encounter for screening mammogram for malignant neoplasm of breast: Secondary | ICD-10-CM | POA: Diagnosis not present

## 2021-05-10 ENCOUNTER — Ambulatory Visit: Payer: BC Managed Care – PPO | Admitting: Family

## 2021-05-29 ENCOUNTER — Other Ambulatory Visit: Payer: Self-pay | Admitting: Neurology

## 2021-05-29 ENCOUNTER — Other Ambulatory Visit: Payer: Self-pay | Admitting: Family

## 2021-05-29 DIAGNOSIS — S0990XD Unspecified injury of head, subsequent encounter: Secondary | ICD-10-CM

## 2021-05-29 DIAGNOSIS — F419 Anxiety disorder, unspecified: Secondary | ICD-10-CM

## 2021-06-01 MED ORDER — ESCITALOPRAM OXALATE 10 MG PO TABS: 10.0000 mg | ORAL_TABLET | Freq: Every day | ORAL | 0 refills | Status: DC

## 2021-06-03 ENCOUNTER — Ambulatory Visit
Admission: RE | Admit: 2021-06-03 | Discharge: 2021-06-03 | Disposition: A | Discharge status: Home / Self Care | Payer: BC Managed Care – PPO | Source: Ambulatory Visit | Attending: Neurology | Admitting: Neurology

## 2021-06-03 DIAGNOSIS — S0990XD Unspecified injury of head, subsequent encounter: Secondary | ICD-10-CM | POA: Insufficient documentation

## 2021-06-05 ENCOUNTER — Ambulatory Visit: Payer: BC Managed Care – PPO

## 2021-06-15 LAB — HM CT VIRTUAL COLONOSCOPY

## 2021-07-13 ENCOUNTER — Telehealth: Payer: Self-pay | Admitting: Family

## 2021-07-13 NOTE — Telephone Encounter (Signed)
Pt sister called in requesting lab orders... No current lab/follow up appointment in system... Pt sister requesting callback...  ?

## 2021-07-17 ENCOUNTER — Other Ambulatory Visit: Payer: Self-pay

## 2021-07-17 DIAGNOSIS — Z0000 Encounter for general adult medical examination without abnormal findings: Secondary | ICD-10-CM

## 2021-07-17 NOTE — Telephone Encounter (Signed)
Spoke to patient about labs for her upcoming visit in August. Labs were ordered ?

## 2021-08-28 ENCOUNTER — Encounter: Payer: Self-pay | Admitting: Family

## 2021-08-29 ENCOUNTER — Other Ambulatory Visit: Payer: Self-pay

## 2021-08-29 DIAGNOSIS — F419 Anxiety disorder, unspecified: Secondary | ICD-10-CM

## 2021-08-29 MED ORDER — ESCITALOPRAM OXALATE 10 MG PO TABS: 10.0000 mg | ORAL_TABLET | Freq: Every day | ORAL | 0 refills | Status: DC

## 2021-08-30 ENCOUNTER — Encounter: Payer: Self-pay | Admitting: Family

## 2021-09-13 ENCOUNTER — Encounter: Payer: Self-pay | Admitting: Family

## 2021-09-13 ENCOUNTER — Ambulatory Visit (INDEPENDENT_AMBULATORY_CARE_PROVIDER_SITE_OTHER): Payer: BC Managed Care – PPO

## 2021-09-13 ENCOUNTER — Ambulatory Visit: Payer: BC Managed Care – PPO | Admitting: Family

## 2021-09-13 VITALS — BP 120/78 | HR 97 | Temp 97.5°F | Ht 70.0 in | Wt 127.6 lb

## 2021-09-13 DIAGNOSIS — M79644 Pain in right finger(s): Secondary | ICD-10-CM | POA: Diagnosis not present

## 2021-09-13 DIAGNOSIS — F419 Anxiety disorder, unspecified: Secondary | ICD-10-CM | POA: Diagnosis not present

## 2021-09-13 MED ORDER — ESCITALOPRAM OXALATE 5 MG PO TABS: 5.0000 mg | ORAL_TABLET | Freq: Every day | ORAL | 3 refills | Status: DC

## 2021-09-13 NOTE — Patient Instructions (Addendum)
You may trial over-the-counter Voltaren gel which is an anti-inflammatory on your right middle finger for pain. Please increase Lexapro from 10 mg daily to 15 mg total once daily.

## 2021-09-13 NOTE — Assessment & Plan Note (Signed)
Suboptimal control. We agreed to increase lexapro from '10mg'$  to '15mg'$ . Close follow up.

## 2021-09-13 NOTE — Assessment & Plan Note (Signed)
Benign exam. PCP joint right 3rd slightly enlarged and I suspect OA. Advised voltaren  Gel.  Pending xr

## 2021-09-13 NOTE — Progress Notes (Signed)
Subjective:    Patient ID: Margaret Mcfarland, female    DOB: 07-Dec-1970, 50 y.o.   MRN: [id number]  CC: Margaret Mcfarland is a 51 y.o. female who presents today for follow up.   HPI: Feels well today She has been eating less sugars, carbs. She has intentionally lost weight.     Increased anxiety of late. Sleeping well. No depression.  She would like small dose increase of lexapro  No si/hi She closed her right hand in [place] door at the bank 3 weeks ago. Swelling and bruising has resolved. She continues to have pain over right 3rd PIP joint. She is able to bend finger.   Colonoscopy done 06/15/21 ( unable to see full report)  HISTORY:  Past Medical History:  Diagnosis Date   Anxiety    Blood in stool    Cataract    Chicken pox    Endometriosis of uterus 05/23/1993   LAP   Genital warts    Kidney disease    per pt medical screening form   Kidney stone    Motion sickness    cars   PONV (postoperative nausea and vomiting)    nausea only   Retinal detachment    Sciatica of left side    Past Surgical History:  Procedure Laterality Date   CATARACT EXTRACTION W/PHACO Left 06/26/2017   Procedure: CATARACT EXTRACTION PHACO AND INTRAOCULAR LENS PLACEMENT ([place]) LEFT;  Surgeon: Leandrew Koyanagi, MD;  Location: [place];  Service: Ophthalmology;  Laterality: Left;   COLONOSCOPY N/A 08/25/2014   Procedure: COLONOSCOPY;  Surgeon: Manya Silvas, MD;  Location: [facility] ENDOSCOPY;  Service: Endoscopy;  Laterality: N/A;   endoscopy     GUM SURGERY  2008   laproscopy     RETINAL DETACHMENT SURGERY     Family History  Problem Relation Age of Onset   Colon cancer Father 72   Arthritis Father    Cancer Father        colon/prostate   Hypertension Father    Arthritis Mother    Hyperlipidemia Mother    Heart disease Mother    Stroke Mother    Hypertension Mother    Kidney disease Mother    Diabetes Mother        Type 1   Congestive Heart Failure Mother    Stroke Maternal  Grandmother    Cancer Maternal Grandfather        prostate   Stroke Paternal Grandfather    Breast cancer Cousin 49       has contact    Allergies: Sertraline Current Outpatient Medications on File Prior to Visit  Medication Sig Dispense Refill   Calcium Carbonate-Vitamin D (CALCIUM 600+D PO) Take by mouth daily.     escitalopram (LEXAPRO) 10 MG tablet Take 1 tablet (10 mg total) by mouth daily. 90 tablet 0   Multiple Vitamin (MULTI-VITAMIN) tablet Take 1 tablet by mouth daily.     Multiple Vitamins-Minerals (ZINC PO) Take by mouth daily.     No current facility-administered medications on file prior to visit.    Social History   Tobacco Use   Smoking status: Never   Smokeless tobacco: Never  Vaping Use   Vaping Use: Never used  Substance Use Topics   Alcohol use: No    Alcohol/week: 0.0 standard drinks of alcohol   Drug use: No    Review of Systems  Constitutional:  Negative for chills and fever.  Respiratory:  Negative for cough.   Cardiovascular:  Negative for chest pain and palpitations.  Gastrointestinal:  Negative for nausea and vomiting.  Musculoskeletal:  Positive for arthralgias.  Psychiatric/Behavioral:  Negative for self-injury and suicidal ideas. The patient is nervous/anxious.       Objective:    BP 120/78 (BP Location: Left Arm, Patient Position: Sitting, Cuff Size: Normal)   Pulse 97   Temp (!) 97.5 F (36.4 C) (Oral)   Ht '5\' 10"'$  (1.778 m)   Wt 127 lb 9.6 oz (57.9 kg)   LMP  (LMP Unknown)   SpO2 99%   BMI 18.31 kg/m  BP Readings from Last 3 Encounters:  09/13/21 120/78  01/25/21 136/84  10/17/20 105/65   Wt Readings from Last 3 Encounters:  09/13/21 127 lb 9.6 oz (57.9 kg)  01/25/21 146 lb (66.2 kg)  10/17/20 146 lb 9.6 oz (66.5 kg)    Physical Exam Vitals reviewed.  Constitutional:      Appearance: She is well-developed.  Eyes:     Conjunctiva/sclera: Conjunctivae normal.  Cardiovascular:     Rate and Rhythm: Normal rate and  regular rhythm.     Pulses: Normal pulses.     Heart sounds: Normal heart sounds.  Pulmonary:     Effort: Pulmonary effort is normal.     Breath sounds: Normal breath sounds. No wheezing, rhonchi or rales.  Musculoskeletal:     Right hand: No swelling, deformity or bony tenderness. Normal range of motion. Normal sensation. There is no disruption of two-point discrimination.  Skin:    General: Skin is warm and dry.  Neurological:     Mental Status: She is alert.  Psychiatric:        Speech: Speech normal.        Behavior: Behavior normal.        Thought Content: Thought content normal.        Assessment & Plan:   Problem List Items Addressed This Visit       Other   Anxiety    Suboptimal control. We agreed to increase lexapro from '10mg'$  to '15mg'$ . Close follow up.       Relevant Medications   escitalopram (LEXAPRO) 5 MG tablet   Finger pain, right - Primary    Benign exam. PCP joint right 3rd slightly enlarged and I suspect OA. Advised voltaren  Gel.  Pending xr      Relevant Orders   DG Hand Complete Right     I am having Margaret Mcfarland start on escitalopram. I am also having her maintain her Calcium Carbonate-Vitamin D (CALCIUM 600+D PO), Multiple Vitamins-Minerals (ZINC PO), Multi-Vitamin, and escitalopram.   Meds ordered this encounter  Medications   escitalopram (LEXAPRO) 5 MG tablet    Sig: Take 1 tablet (5 mg total) by mouth daily.    Dispense:  90 tablet    Refill:  3    Order Specific Question:   Supervising Provider    Answer:   Crecencio Mc [[id number]]    Return precautions given.   Risks, benefits, and alternatives of the medications and treatment plan prescribed today were discussed, and patient expressed understanding.   Education regarding symptom management and diagnosis given to patient on AVS.  Continue to follow with Burnard Hawthorne, FNP for routine health maintenance.   Margaret Mcfarland and I agreed with plan.   Mable Paris, FNP

## 2021-09-15 ENCOUNTER — Encounter: Payer: Self-pay | Admitting: Family

## 2021-10-02 ENCOUNTER — Other Ambulatory Visit (INDEPENDENT_AMBULATORY_CARE_PROVIDER_SITE_OTHER): Payer: BC Managed Care – PPO

## 2021-10-02 DIAGNOSIS — Z0000 Encounter for general adult medical examination without abnormal findings: Secondary | ICD-10-CM

## 2021-10-02 LAB — CBC WITH DIFFERENTIAL/PLATELET
Basophils Absolute: 0 10*3/uL (ref 0.0–0.1)
Basophils Relative: 0.5 % (ref 0.0–3.0)
Eosinophils Absolute: 0.1 10*3/uL (ref 0.0–0.7)
Eosinophils Relative: 2.9 % (ref 0.0–5.0)
HCT: 42.1 % (ref 36.0–46.0)
Hemoglobin: 14.1 g/dL (ref 12.0–15.0)
Lymphocytes Relative: 40.7 % (ref 12.0–46.0)
Lymphs Abs: 1.5 10*3/uL (ref 0.7–4.0)
MCHC: 33.6 g/dL (ref 30.0–36.0)
MCV: 89.8 fl (ref 78.0–100.0)
Monocytes Absolute: 0.3 10*3/uL (ref 0.1–1.0)
Monocytes Relative: 9.3 % (ref 3.0–12.0)
Neutro Abs: 1.7 10*3/uL (ref 1.4–7.7)
Neutrophils Relative %: 46.6 % (ref 43.0–77.0)
Platelets: 184 10*3/uL (ref 150.0–400.0)
RBC: 4.69 Mil/uL (ref 3.87–5.11)
RDW: 13.1 % (ref 11.5–15.5)
WBC: 3.7 10*3/uL — ABNORMAL LOW (ref 4.0–10.5)

## 2021-10-02 LAB — COMPREHENSIVE METABOLIC PANEL
ALT: 21 U/L (ref 0–35)
AST: 17 U/L (ref 0–37)
Albumin: 4.4 g/dL (ref 3.5–5.2)
Alkaline Phosphatase: 55 U/L (ref 39–117)
BUN: 11 mg/dL (ref 6–23)
CO2: 27 mEq/L (ref 19–32)
Calcium: 9.7 mg/dL (ref 8.4–10.5)
Chloride: 105 mEq/L (ref 96–112)
Creatinine, Ser: 0.73 mg/dL (ref 0.40–1.20)
GFR: 94.04 mL/min (ref >60.00)
Glucose, Bld: 90 mg/dL (ref 70–99)
Potassium: 3.9 mEq/L (ref 3.5–5.1)
Sodium: 141 mEq/L (ref 135–145)
Total Bilirubin: 0.8 mg/dL (ref 0.2–1.2)
Total Protein: 6.9 g/dL (ref 6.0–8.3)

## 2021-10-02 LAB — TSH: TSH: 0.38 u[IU]/mL (ref 0.35–5.50)

## 2021-10-02 LAB — HEMOGLOBIN A1C: Hgb A1c MFr Bld: 5.9 % (ref 4.6–6.5)

## 2021-10-02 LAB — VITAMIN D 25 HYDROXY (VIT D DEFICIENCY, FRACTURES): VITD: 38.93 ng/mL (ref 30.00–100.00)

## 2021-10-03 ENCOUNTER — Other Ambulatory Visit: Payer: Self-pay

## 2021-10-03 ENCOUNTER — Other Ambulatory Visit (INDEPENDENT_AMBULATORY_CARE_PROVIDER_SITE_OTHER): Payer: BC Managed Care – PPO

## 2021-10-03 ENCOUNTER — Ambulatory Visit: Payer: BC Managed Care – PPO | Admitting: Family

## 2021-10-03 ENCOUNTER — Encounter: Payer: Self-pay | Admitting: Family

## 2021-10-03 DIAGNOSIS — Z0000 Encounter for general adult medical examination without abnormal findings: Secondary | ICD-10-CM

## 2021-10-03 DIAGNOSIS — R5383 Other fatigue: Secondary | ICD-10-CM

## 2021-10-03 LAB — LIPID PANEL
Cholesterol: 161 mg/dL (ref 0–200)
HDL: 60 mg/dL (ref >39.00)
LDL Cholesterol: 90 mg/dL (ref 0–99)
NonHDL: 101.17
Total CHOL/HDL Ratio: 3
Triglycerides: 54 mg/dL (ref 0.0–149.0)
VLDL: 10.8 mg/dL (ref 0.0–40.0)

## 2021-10-06 ENCOUNTER — Ambulatory Visit: Payer: BC Managed Care – PPO | Admitting: Family

## 2021-10-06 ENCOUNTER — Encounter: Payer: Self-pay | Admitting: Family

## 2021-10-06 VITALS — BP 118/80 | HR 69 | Temp 97.9°F | Ht 70.0 in | Wt 128.8 lb

## 2021-10-06 DIAGNOSIS — R079 Chest pain, unspecified: Secondary | ICD-10-CM

## 2021-10-06 DIAGNOSIS — F419 Anxiety disorder, unspecified: Secondary | ICD-10-CM | POA: Diagnosis not present

## 2021-10-06 NOTE — Progress Notes (Signed)
Subjective:    Patient ID: Margaret Mcfarland, female    DOB: 1970/03/26, 51 y.o.   MRN: [id number]  CC: Margaret Mcfarland is a 50 y.o. female who presents today for follow up.   HPI: She complains of episodic left-sided chest pain when she is "rushing" or 'picks up the pace' at work.  She is a particularly think she is anxious at that time.  No associated shortness of breath, cough, palpitations, or arm numbness. No chest pain at this time.  She does not formally exercise however she does mow the yard and weeds in in the yard without chest pain or shortness of breath.    Increased Lexapro from 10 mg to 15 mg previously.  She feels that medication is helping.  Echocardiogram 06/19/2017 with normal left ventricular systolic function, normal right ventricular systolic function.  Mild valvular regurgitation.  No valvular stenosis.  She is also see Margaret Mcfarland in the past.  Evaluation for chest pain 08/06/2019 Margaret Mcfarland.  Echocardiogram ordered at that time however does not appear obtained   HISTORY:  Past Medical History:  Diagnosis Date   Anxiety    Blood in stool    Cataract    Chicken pox    Endometriosis of uterus 05/23/1993   LAP   Genital warts    Kidney disease    per pt medical screening form   Kidney stone    Motion sickness    cars   PONV (postoperative nausea and vomiting)    nausea only   Retinal detachment    Sciatica of left side    Past Surgical History:  Procedure Laterality Date   CATARACT EXTRACTION W/PHACO Left 06/26/2017   Procedure: CATARACT EXTRACTION PHACO AND INTRAOCULAR LENS PLACEMENT ([place]) LEFT;  Surgeon: Leandrew Koyanagi, MD;  Location: [place];  Service: Ophthalmology;  Laterality: Left;   COLONOSCOPY N/A 08/25/2014   Procedure: COLONOSCOPY;  Surgeon: Manya Silvas, MD;  Location: [facility] ENDOSCOPY;  Service: Endoscopy;  Laterality: N/A;   endoscopy     GUM SURGERY  2008   laproscopy     RETINAL DETACHMENT SURGERY     Family History  Problem  Relation Age of Onset   Colon cancer Father 45   Arthritis Father    Cancer Father        colon/prostate   Hypertension Father    Arthritis Mother    Hyperlipidemia Mother    Heart disease Mother    Stroke Mother    Hypertension Mother    Kidney disease Mother    Diabetes Mother        Type 1   Congestive Heart Failure Mother    Stroke Maternal Grandmother    Cancer Maternal Grandfather        prostate   Stroke Paternal Grandfather    Breast cancer Cousin 67       has contact    Allergies: Sertraline Current Outpatient Medications on File Prior to Visit  Medication Sig Dispense Refill   Calcium Carbonate-Vitamin D (CALCIUM 600+D PO) Take by mouth daily.     escitalopram (LEXAPRO) 10 MG tablet Take 1 tablet (10 mg total) by mouth daily. 90 tablet 0   escitalopram (LEXAPRO) 5 MG tablet Take 1 tablet (5 mg total) by mouth daily. 90 tablet 3   Multiple Vitamin (MULTI-VITAMIN) tablet Take 1 tablet by mouth daily.     Multiple Vitamins-Minerals (ZINC PO) Take by mouth daily.     No current facility-administered medications on file prior  to visit.    Social History   Tobacco Use   Smoking status: Never   Smokeless tobacco: Never  Vaping Use   Vaping Use: Never used  Substance Use Topics   Alcohol use: No    Alcohol/week: 0.0 standard drinks of alcohol   Drug use: No    Review of Systems  Constitutional:  Negative for chills and fever.  Respiratory:  Negative for cough.   Cardiovascular:  Negative for chest pain, palpitations and leg swelling.  Gastrointestinal:  Negative for nausea and vomiting.      Objective:    BP 118/80 (BP Location: Left Arm, Patient Position: Sitting, Cuff Size: Normal)   Pulse 69   Temp 97.9 F (36.6 C) (Oral)   Ht '5\' 10"'$  (1.778 m)   Wt 128 lb 12.8 oz (58.4 kg)   LMP 09/25/2021 (Exact Date)   SpO2 97%   BMI 18.48 kg/m  BP Readings from Last 3 Encounters:  10/06/21 118/80  09/13/21 120/78  01/25/21 136/84   Wt Readings from Last  3 Encounters:  10/06/21 128 lb 12.8 oz (58.4 kg)  09/13/21 127 lb 9.6 oz (57.9 kg)  01/25/21 146 lb (66.2 kg)    Physical Exam Vitals reviewed.  Constitutional:      Appearance: She is well-developed.  Eyes:     Conjunctiva/sclera: Conjunctivae normal.  Cardiovascular:     Rate and Rhythm: Normal rate and regular rhythm.     Pulses: Normal pulses.     Heart sounds: Normal heart sounds.  Pulmonary:     Effort: Pulmonary effort is normal.     Breath sounds: Normal breath sounds. No wheezing, rhonchi or rales.  Musculoskeletal:     Right lower leg: No edema.     Left lower leg: No edema.  Skin:    General: Skin is warm and dry.  Neurological:     Mental Status: She is alert.  Psychiatric:        Speech: Speech normal.        Behavior: Behavior normal.        Thought Content: Thought content normal.        Assessment & Plan:   Problem List Items Addressed This Visit       Other   Anxiety    Chronic, improved. Continue lexapro '15mg'$       Chest pain - Primary    Chest pain appears atypical.  Reassured that patient is able to perform yard work and housework without chest pain.  It seems that when she is "rushing" or 'picks up the pace' at work that this symptom presents.  Patient did not feel that anxiety was particular contributing.  EKG today shows sinus bradycardia of at baseline when compared to previous EKG, no significant changes.  Reviewed previous cardiology note and I have ordered echocardiogram and also rereferred her to Margaret Mcfarland for further evaluation and discussion of ischemic testing.  Counseled patient on importance of remaining very vigilant and certainly chest pain or to intensify or be accompanied by other features, she would need to report to emergency room.  will follow      Relevant Orders   EKG 12-Lead (Completed)   Ambulatory referral to Cardiology   ECHOCARDIOGRAM COMPLETE     I am having Gerlean Cid maintain her Calcium Carbonate-Vitamin D (CALCIUM  600+D PO), Multiple Vitamins-Minerals (ZINC PO), Multi-Vitamin, escitalopram, and escitalopram.   No orders of the defined types were placed in this encounter.   Return precautions given.  Risks, benefits, and alternatives of the medications and treatment plan prescribed today were discussed, and patient expressed understanding.   Education regarding symptom management and diagnosis given to patient on AVS.  Continue to follow with Burnard Hawthorne, FNP for routine health maintenance.   Godfrey Pick and I agreed with plan.   Mable Paris, FNP

## 2021-10-06 NOTE — Assessment & Plan Note (Signed)
Chronic, improved. Continue lexapro '15mg'$ 

## 2021-10-06 NOTE — Patient Instructions (Addendum)
Please remain incredibly vigilant as a know you will be.   If  chest pain were to change in any way, intensify or be associated with shortness of breath, palpitations, left arm or jaw numbness, please go directly to the emergency room further evaluation.    I placed a referral to cardiology, Dr. Saunders Revel and also ordered an ultrasound of your heart.   Let us know if you dont hear back within a week in regards to an appointment being scheduled.   Nonspecific Chest Pain, Adult Chest pain is an uncomfortable, tight, or painful feeling in the chest. The pain can feel like a crushing, aching, or squeezing pressure. A person can feel a burning or tingling sensation. Chest pain can also be felt in your back, neck, jaw, shoulder, or arm. This pain can be worse when you move, sneeze, or take a deep breath. Chest pain can be caused by a condition that is life-threatening. This must be treated right away. It can also be caused by something that is not life-threatening. If you have chest pain, it can be hard to know the difference, so it is important to get help right away to make sure that you do not have a serious condition. Some life-threatening causes of chest pain include: Heart attack. A tear in the body's main blood vessel (aortic dissection). Inflammation around your heart (pericarditis). A problem in the lungs, such as a blood clot (pulmonary embolism) or a collapsed lung (pneumothorax). Some non life-threatening causes of chest pain include: Heartburn. Anxiety or stress. Damage to the bones, muscles, and cartilage that make up your chest wall. Pneumonia or bronchitis. Shingles infection (varicella-zoster virus). Your chest pain may come and go. It may also be constant. Your health care provider will do tests and other studies to find the cause of your pain. Treatment will depend on the cause of your chest pain. Follow these instructions at home: Medicines Take over-the-counter and prescription  medicines only as told by your health care provider. If you were prescribed an antibiotic medicine, take it as told by your health care provider. Do not stop taking the antibiotic even if you start to feel better. Activity Avoid any activities that cause chest pain. Do not lift anything that is heavier than 10 lb (4.5 kg), or the limit that you are told, until your health care provider says that it is safe. Rest as directed by your health care provider. Return to your normal activities only as told by your health care provider. Ask your health care provider what activities are safe for you. Lifestyle     Do not use any products that contain nicotine or tobacco, such as cigarettes, e-cigarettes, and chewing tobacco. If you need help quitting, ask your health care provider. Do not drink alcohol. Make healthy lifestyle changes as recommended. These may include: Getting regular exercise. Ask your health care provider to suggest some exercises that are safe for you. Eating a heart-healthy diet. This includes plenty of fresh fruits and vegetables, whole grains, low-fat (lean) protein, and low-fat dairy products. A dietitian can help you find healthy eating options. Maintaining a healthy weight. Managing any other health conditions you may have, such as high blood pressure (hypertension) or diabetes. Reducing stress, such as with yoga or relaxation techniques. General instructions Pay attention to any changes in your symptoms. It is up to you to get the results of any tests that were done. Ask your health care provider, or the department that is doing the tests,  when your results will be ready. Keep all follow-up visits as told by your health care provider. This is important. You may be asked to go for further testing if your chest pain does not go away. Contact a health care provider if: Your chest pain does not go away. You feel depressed. You have a fever. You notice changes in your symptoms  or develop new symptoms. Get help right away if: Your chest pain gets worse. You have a cough that gets worse, or you cough up blood. You have severe pain in your abdomen. You faint. You have sudden, unexplained chest discomfort. You have sudden, unexplained discomfort in your arms, back, neck, or jaw. You have shortness of breath at any time. You suddenly start to sweat, or your skin gets clammy. You feel nausea or you vomit. You suddenly feel lightheaded or dizzy. You have severe weakness, or unexplained weakness or fatigue. Your heart begins to beat quickly, or it feels like it is skipping beats. These symptoms may represent a serious problem that is an emergency. Do not wait to see if the symptoms will go away. Get medical help right away. Call your local emergency services (911 in the [place] Do not drive yourself to the hospital. Summary Chest pain can be caused by a condition that is serious and requires urgent treatment. It may also be caused by something that is not life-threatening. Your health care provider may do lab tests and other studies to find the cause of your pain. Follow your health care provider's instructions on taking medicines, making lifestyle changes, and getting emergency treatment if symptoms become worse. Keep all follow-up visits as told by your health care provider. This includes visits for any further testing if your chest pain does not go away. This information is not intended to replace advice given to you by your health care provider. Make sure you discuss any questions you have with your health care provider. Document Revised: 05/05/2020 Document Reviewed: 05/05/2020 Elsevier Patient Education  [place].

## 2021-10-06 NOTE — Assessment & Plan Note (Signed)
Chest pain appears atypical.  Reassured that patient is able to perform yard work and housework without chest pain.  It seems that when she is "rushing" or 'picks up the pace' at work that this symptom presents.  Patient did not feel that anxiety was particular contributing.  EKG today shows sinus bradycardia of at baseline when compared to previous EKG, no significant changes.  Reviewed previous cardiology note and I have ordered echocardiogram and also rereferred her to Dr. Saunders Revel for further evaluation and discussion of ischemic testing.  Counseled patient on importance of remaining very vigilant and certainly chest pain or to intensify or be accompanied by other features, she would need to report to emergency room.  will follow

## 2021-10-09 ENCOUNTER — Encounter: Payer: Self-pay | Admitting: Family

## 2021-10-10 NOTE — Telephone Encounter (Signed)
Spoke to patient and she feels like it is getting better so she does not need to come in

## 2021-10-11 ENCOUNTER — Ambulatory Visit: Payer: BC Managed Care – PPO | Admitting: Family

## 2021-10-22 NOTE — Progress Notes (Signed)
MRN : [id number]  Margaret Mcfarland is a 51 y.o. (12/03/1970) female who presents with chief complaint of legs swell.  History of Present Illness:   The patient returns to the office for followup evaluation regarding leg swelling.  The swelling has persisted but with  her compression socks and the lymph pump the patient states the swelling is much better controlled. The pain associated with swelling is essentially eliminated. There have not been any interval development of a ulcerations or wounds.  No episodes of cellulitis or infection over the past 12 months   The patient denies problems with the pump, noting it is working well and the leggings are in good condition.   Since the previous visit the patient has been wearing graduated compression stockings and using the lymph pump on a routine basis and  has noted significant improvement in the lymphedema.    Patient stated the lymph pump has been a very positive factor in her care.   No outpatient medications have been marked as taking for the 10/23/21 encounter (Appointment) with Delana Meyer, Dolores Lory, MD.    Past Medical History:  Diagnosis Date   Anxiety    Blood in stool    Cataract    Chicken pox    Endometriosis of uterus 05/23/1993   LAP   Genital warts    Kidney disease    per pt medical screening form   Kidney stone    Motion sickness    cars   PONV (postoperative nausea and vomiting)    nausea only   Retinal detachment    Sciatica of left side     Past Surgical History:  Procedure Laterality Date   CATARACT EXTRACTION W/PHACO Left 06/26/2017   Procedure: CATARACT EXTRACTION PHACO AND INTRAOCULAR LENS PLACEMENT ([place]) LEFT;  Surgeon: Leandrew Koyanagi, MD;  Location: [place];  Service: Ophthalmology;  Laterality: Left;   COLONOSCOPY N/A 08/25/2014   Procedure: COLONOSCOPY;  Surgeon: Manya Silvas, MD;  Location: [facility] ENDOSCOPY;  Service: Endoscopy;  Laterality: N/A;   endoscopy     GUM SURGERY  2008    laproscopy     RETINAL DETACHMENT SURGERY      Social History Social History   Tobacco Use   Smoking status: Never   Smokeless tobacco: Never  Vaping Use   Vaping Use: Never used  Substance Use Topics   Alcohol use: No    Alcohol/week: 0.0 standard drinks of alcohol   Drug use: No    Family History Family History  Problem Relation Age of Onset   Colon cancer Father 64   Arthritis Father    Cancer Father        colon/prostate   Hypertension Father    Arthritis Mother    Hyperlipidemia Mother    Heart disease Mother    Stroke Mother    Hypertension Mother    Kidney disease Mother    Diabetes Mother        Type 1   Congestive Heart Failure Mother    Stroke Maternal Grandmother    Cancer Maternal Grandfather        prostate   Stroke Paternal Grandfather    Breast cancer Cousin 4       has contact    Allergies  Allergen Reactions   Sertraline Diarrhea     REVIEW OF SYSTEMS (Negative unless checked)  Constitutional: '[]'$ Weight loss  '[]'$ Fever  '[]'$ Chills Cardiac: '[]'$ Chest pain   '[]'$ Chest pressure   '[]'$ Palpitations   '[]'$   Shortness of breath when laying flat   '[]'$ Shortness of breath with exertion. Vascular:  '[]'$ Pain in legs with walking   '[x]'$ Pain in legs with standing  '[]'$ History of DVT   '[]'$ Phlebitis   '[x]'$ Swelling in legs   '[]'$ Varicose veins   '[]'$ Non-healing ulcers Pulmonary:   '[]'$ Uses home oxygen   '[]'$ Productive cough   '[]'$ Hemoptysis   '[]'$ Wheeze  '[]'$ COPD   '[]'$ Asthma Neurologic:  '[]'$ Dizziness   '[]'$ Seizures   '[]'$ History of stroke   '[]'$ History of TIA  '[]'$ Aphasia   '[]'$ Vissual changes   '[]'$ Weakness or numbness in arm   '[]'$ Weakness or numbness in leg Musculoskeletal:   '[]'$ Joint swelling   '[]'$ Joint pain   '[]'$ Low back pain Hematologic:  '[]'$ Easy bruising  '[]'$ Easy bleeding   '[]'$ Hypercoagulable state   '[]'$ Anemic Gastrointestinal:  '[]'$ Diarrhea   '[]'$ Vomiting  '[]'$ Gastroesophageal reflux/heartburn   '[]'$ Difficulty swallowing. Genitourinary:  '[]'$ Chronic kidney disease   '[]'$ Difficult urination  '[]'$ Frequent urination    '[]'$ Blood in urine Skin:  '[]'$ Rashes   '[]'$ Ulcers  Psychological:  '[]'$ History of anxiety   '[]'$  History of major depression.  Physical Examination  There were no vitals filed for this visit. There is no height or weight on file to calculate BMI. Gen: WD/WN, NAD Head: [place]/AT, No temporalis wasting.  Ear/Nose/Throat: Hearing grossly intact, nares w/o erythema or drainage, pinna without lesions Eyes: PER, EOMI, sclera nonicteric.  Neck: Supple, no gross masses.  No JVD.  Pulmonary:  Good air movement, no audible wheezing, no use of accessory muscles.  Cardiac: RRR, precordium not hyperdynamic. Vascular:  scattered varicosities present bilaterally.  Mild venous stasis changes to the legs bilaterally.  2+ soft pitting edema  Vessel Right Left  Radial Palpable Palpable  Gastrointestinal: soft, non-distended. No guarding/no peritoneal signs.  Musculoskeletal: M/S 5/5 throughout.  No deformity.  Neurologic: CN 2-12 intact. Pain and light touch intact in extremities.  Symmetrical.  Speech is fluent. Motor exam as listed above. Psychiatric: Judgment intact, Mood & affect appropriate for pt's clinical situation. Dermatologic: Venous rashes no ulcers noted.  No changes consistent with cellulitis. Lymph : No lichenification or skin changes of chronic lymphedema.  CBC Lab Results  Component Value Date   WBC 3.7 (L) 10/02/2021   HGB 14.1 10/02/2021   HCT 42.1 10/02/2021   MCV 89.8 10/02/2021   PLT 184.0 10/02/2021    BMET    Component Value Date/Time   NA 141 10/02/2021 0816   K 3.9 10/02/2021 0816   CL 105 10/02/2021 0816   CO2 27 10/02/2021 0816   GLUCOSE 90 10/02/2021 0816   BUN 11 10/02/2021 0816   CREATININE 0.73 10/02/2021 0816   CREATININE 0.73 07/01/2015 1527   CALCIUM 9.7 10/02/2021 0816   GFRNONAA >60 05/30/2017 1840   GFRAA >60 05/30/2017 1840   CrCl cannot be calculated (Unknown ideal weight.).  COAG Lab Results  Component Value Date   INR 1.05 07/01/2015    Radiology No  results found.   Assessment/Plan 1. Lymphedema Recommend:  No surgery or intervention at this point in time.    I have reviewed my discussion with the patient regarding lymphedema and why it  causes symptoms.  Patient will continue wearing graduated compression on a daily basis. The patient should put the compression on first thing in the morning and removing them in the evening. The patient should not sleep in the compression.   In addition, behavioral modification throughout the day will be continued.  This will include frequent elevation (such as in a recliner), use of over the counter  pain medications as needed and exercise such as walking.  The systemic causes for chronic edema such as liver, kidney and cardiac etiologies does not appear to have significant changed over the past year.    The patient will continue aggressive use of the  lymph pump.  This will continue to improve the edema control and prevent sequela such as ulcers and infections.   The patient will follow-up with me on an annual basis.    2. Chronic venous insufficiency Recommend:  No surgery or intervention at this point in time.    I have reviewed my discussion with the patient regarding lymphedema and why it  causes symptoms.  Patient will continue wearing graduated compression on a daily basis. The patient should put the compression on first thing in the morning and removing them in the evening. The patient should not sleep in the compression.   In addition, behavioral modification throughout the day will be continued.  This will include frequent elevation (such as in a recliner), use of over the counter pain medications as needed and exercise such as walking.  The systemic causes for chronic edema such as liver, kidney and cardiac etiologies does not appear to have significant changed over the past year.    The patient will continue aggressive use of the  lymph pump.  This will continue to improve the edema  control and prevent sequela such as ulcers and infections.   The patient will follow-up with me on an annual basis.    3. Hyperlipidemia, unspecified hyperlipidemia type Continue statin as ordered and reviewed, no changes at this time     Hortencia Pilar, MD  10/22/2021 4:42 PM

## 2021-10-23 ENCOUNTER — Ambulatory Visit (INDEPENDENT_AMBULATORY_CARE_PROVIDER_SITE_OTHER): Payer: BC Managed Care – PPO | Admitting: Vascular Surgery

## 2021-10-23 ENCOUNTER — Encounter (INDEPENDENT_AMBULATORY_CARE_PROVIDER_SITE_OTHER): Payer: Self-pay | Admitting: Vascular Surgery

## 2021-10-23 ENCOUNTER — Encounter: Payer: Self-pay | Admitting: Family

## 2021-10-23 VITALS — BP 95/59 | HR 81 | Resp 16 | Wt 130.4 lb

## 2021-10-23 DIAGNOSIS — I872 Venous insufficiency (chronic) (peripheral): Secondary | ICD-10-CM | POA: Diagnosis not present

## 2021-10-23 DIAGNOSIS — E785 Hyperlipidemia, unspecified: Secondary | ICD-10-CM | POA: Diagnosis not present

## 2021-10-23 DIAGNOSIS — I890 Lymphedema, not elsewhere classified: Secondary | ICD-10-CM | POA: Diagnosis not present

## 2021-11-02 ENCOUNTER — Telehealth: Payer: Self-pay

## 2021-11-02 NOTE — Telephone Encounter (Signed)
Thanks Margaret Mcfarland.Marland KitchenMarland KitchenNOTED

## 2021-11-02 NOTE — Telephone Encounter (Signed)
Patient states she was hurt at work and they need to know when she last had her tetanus shot.  I spoke with Jenate Martinique, CMA, and she states patient last had a tetanus shot on 08/28/2019.  I relayed information to patient.

## 2021-11-03 ENCOUNTER — Other Ambulatory Visit: Payer: BC Managed Care – PPO

## 2021-11-10 ENCOUNTER — Other Ambulatory Visit (INDEPENDENT_AMBULATORY_CARE_PROVIDER_SITE_OTHER): Payer: BC Managed Care – PPO

## 2021-11-10 DIAGNOSIS — R5383 Other fatigue: Secondary | ICD-10-CM | POA: Diagnosis not present

## 2021-11-10 NOTE — Addendum Note (Signed)
Addended by: Leeanne Rio on: 11/10/2021 12:48 PM   Modules accepted: Orders

## 2021-11-11 LAB — CBC WITH DIFFERENTIAL/PLATELET
Basophils Absolute: 0 10*3/uL (ref 0.0–0.2)
Basos: 1 %
EOS (ABSOLUTE): 0.2 10*3/uL (ref 0.0–0.4)
Eos: 3 %
Hematocrit: 37.8 % (ref 34.0–46.6)
Hemoglobin: 13.3 g/dL (ref 11.1–15.9)
Immature Grans (Abs): 0 10*3/uL (ref 0.0–0.1)
Immature Granulocytes: 0 %
Lymphocytes Absolute: 1.9 10*3/uL (ref 0.7–3.1)
Lymphs: 36 %
MCH: 31.1 pg (ref 26.6–33.0)
MCHC: 35.2 g/dL (ref 31.5–35.7)
MCV: 88 fL (ref 79–97)
Monocytes Absolute: 0.4 10*3/uL (ref 0.1–0.9)
Monocytes: 8 %
Neutrophils Absolute: 2.8 10*3/uL (ref 1.4–7.0)
Neutrophils: 52 %
Platelets: 211 10*3/uL (ref 150–450)
RBC: 4.28 x10E6/uL (ref 3.77–5.28)
RDW: 12.8 % (ref 11.7–15.4)
WBC: 5.3 10*3/uL (ref 3.4–10.8)

## 2021-11-13 ENCOUNTER — Other Ambulatory Visit: Payer: BC Managed Care – PPO

## 2021-11-22 ENCOUNTER — Other Ambulatory Visit: Payer: BC Managed Care – PPO

## 2021-11-25 ENCOUNTER — Other Ambulatory Visit: Payer: Self-pay | Admitting: Family

## 2021-11-25 DIAGNOSIS — F419 Anxiety disorder, unspecified: Secondary | ICD-10-CM

## 2021-11-27 ENCOUNTER — Encounter: Payer: Self-pay | Admitting: Family

## 2021-11-28 ENCOUNTER — Other Ambulatory Visit: Payer: Self-pay | Admitting: Family

## 2021-11-28 DIAGNOSIS — F419 Anxiety disorder, unspecified: Secondary | ICD-10-CM

## 2021-11-28 MED ORDER — ESCITALOPRAM OXALATE 20 MG PO TABS: 20.0000 mg | ORAL_TABLET | Freq: Every day | ORAL | 3 refills | Status: DC

## 2021-12-01 ENCOUNTER — Other Ambulatory Visit: Payer: BC Managed Care – PPO

## 2021-12-20 ENCOUNTER — Encounter: Payer: Self-pay | Admitting: Podiatry

## 2021-12-20 ENCOUNTER — Ambulatory Visit: Payer: BC Managed Care – PPO | Admitting: Podiatry

## 2021-12-20 DIAGNOSIS — M722 Plantar fascial fibromatosis: Secondary | ICD-10-CM

## 2021-12-20 DIAGNOSIS — D2372 Other benign neoplasm of skin of left lower limb, including hip: Secondary | ICD-10-CM

## 2021-12-20 DIAGNOSIS — D2371 Other benign neoplasm of skin of right lower limb, including hip: Secondary | ICD-10-CM | POA: Diagnosis not present

## 2021-12-20 NOTE — Progress Notes (Signed)
She presents today chief concern of painful calluses bilaterally.  She has Planter fasciitis and states that she needs a day night splint.  If she would like a new silicone pad for her great toe.  Objective: Vital signs stable alert oriented x3.  Pulses are palpable.  No reproducible pain on palpation of the medial calcaneal tubercle other than just a very small amount.  She still has a callus to the medial aspect of the hallux left.  And plantar benign skin lesions Sub first and Sub fifth bilaterally.  No open lesions or wounds are noted.  Pulses are strong and palpable.  Assessment: Painful benign skin lesions.  Painful IPJ of the hallux.  And resolving Planter fasciitis.  Plan: Dispensed a new plantar fascia brace and a night splint.  And abraded her benign skin lesions.  Follow-up with her as needed.

## 2022-01-01 ENCOUNTER — Encounter (INDEPENDENT_AMBULATORY_CARE_PROVIDER_SITE_OTHER): Payer: Self-pay

## 2022-01-23 ENCOUNTER — Ambulatory Visit: Payer: BC Managed Care – PPO | Admitting: Family

## 2022-01-31 ENCOUNTER — Other Ambulatory Visit: Payer: Self-pay | Admitting: Obstetrics and Gynecology

## 2022-01-31 DIAGNOSIS — N644 Mastodynia: Secondary | ICD-10-CM

## 2022-02-22 ENCOUNTER — Ambulatory Visit: Payer: BC Managed Care – PPO | Admitting: Internal Medicine

## 2022-03-20 ENCOUNTER — Other Ambulatory Visit: Payer: Self-pay | Admitting: Obstetrics and Gynecology

## 2022-03-20 DIAGNOSIS — Z1231 Encounter for screening mammogram for malignant neoplasm of breast: Secondary | ICD-10-CM

## 2022-03-27 ENCOUNTER — Other Ambulatory Visit: Payer: BC Managed Care – PPO

## 2022-03-29 ENCOUNTER — Other Ambulatory Visit: Payer: BC Managed Care – PPO

## 2022-04-18 ENCOUNTER — Ambulatory Visit
Admission: RE | Admit: 2022-04-18 | Discharge: 2022-04-18 | Disposition: A | Discharge status: Home / Self Care | Payer: BC Managed Care – PPO | Source: Ambulatory Visit | Attending: Obstetrics and Gynecology | Admitting: Obstetrics and Gynecology

## 2022-04-18 DIAGNOSIS — N644 Mastodynia: Secondary | ICD-10-CM | POA: Insufficient documentation

## 2022-04-24 ENCOUNTER — Other Ambulatory Visit: Payer: Self-pay | Admitting: Obstetrics and Gynecology

## 2022-04-24 DIAGNOSIS — N63 Unspecified lump in unspecified breast: Secondary | ICD-10-CM

## 2022-04-24 DIAGNOSIS — R928 Other abnormal and inconclusive findings on diagnostic imaging of breast: Secondary | ICD-10-CM

## 2022-05-08 ENCOUNTER — Ambulatory Visit
Admission: RE | Admit: 2022-05-08 | Discharge: 2022-05-08 | Disposition: A | Discharge status: Home / Self Care | Payer: BC Managed Care – PPO | Source: Ambulatory Visit | Attending: Obstetrics and Gynecology | Admitting: Obstetrics and Gynecology

## 2022-05-08 DIAGNOSIS — N63 Unspecified lump in unspecified breast: Secondary | ICD-10-CM | POA: Diagnosis present

## 2022-05-08 DIAGNOSIS — R928 Other abnormal and inconclusive findings on diagnostic imaging of breast: Secondary | ICD-10-CM | POA: Diagnosis not present

## 2022-05-08 HISTORY — PX: BREAST BIOPSY: SHX20

## 2022-05-08 MED ORDER — LIDOCAINE-EPINEPHRINE 1 %-1:100000 IJ SOLN
8.0000 mL | Freq: Once | INTRAMUSCULAR | Status: AC
  Administered 2022-05-08: 8 mL

## 2022-05-08 MED ORDER — LIDOCAINE HCL (PF) 1 % IJ SOLN
2.0000 mL | Freq: Once | INTRAMUSCULAR | Status: AC
  Administered 2022-05-08: 2 mL

## 2022-05-09 LAB — SURGICAL PATHOLOGY

## 2022-05-15 ENCOUNTER — Telehealth: Payer: Self-pay | Admitting: Family

## 2022-05-15 NOTE — Telephone Encounter (Signed)
Spoke to pt and scheduled Lab appt and f/up for her and her sister

## 2022-05-15 NOTE — Telephone Encounter (Signed)
Pt called in returning [place] call. Unable to transferred. She's available '@[phone]'$ [phone] or [phone].

## 2022-06-04 ENCOUNTER — Other Ambulatory Visit: Payer: BC Managed Care – PPO

## 2022-10-01 ENCOUNTER — Telehealth: Payer: Self-pay | Admitting: Family

## 2022-10-01 NOTE — Telephone Encounter (Signed)
Patient need lab orders.

## 2022-10-03 NOTE — Telephone Encounter (Signed)
Spoke to pt and scheduled appt for Mon August 5th

## 2022-10-08 ENCOUNTER — Other Ambulatory Visit: Payer: BC Managed Care – PPO

## 2022-10-08 ENCOUNTER — Ambulatory Visit: Payer: BC Managed Care – PPO | Admitting: Family

## 2022-10-08 DIAGNOSIS — S8002XA Contusion of left knee, initial encounter: Secondary | ICD-10-CM | POA: Insufficient documentation

## 2022-10-10 ENCOUNTER — Ambulatory Visit: Payer: BC Managed Care – PPO | Admitting: Family

## 2022-10-10 ENCOUNTER — Encounter: Payer: Self-pay | Admitting: Family

## 2022-10-10 VITALS — BP 110/72 | HR 75 | Temp 97.5°F | Ht 69.0 in | Wt 119.2 lb

## 2022-10-10 DIAGNOSIS — R7303 Prediabetes: Secondary | ICD-10-CM | POA: Diagnosis not present

## 2022-10-10 DIAGNOSIS — R41840 Attention and concentration deficit: Secondary | ICD-10-CM | POA: Diagnosis not present

## 2022-10-10 DIAGNOSIS — Z13220 Encounter for screening for lipoid disorders: Secondary | ICD-10-CM

## 2022-10-10 DIAGNOSIS — F419 Anxiety disorder, unspecified: Secondary | ICD-10-CM | POA: Diagnosis not present

## 2022-10-10 DIAGNOSIS — Z136 Encounter for screening for cardiovascular disorders: Secondary | ICD-10-CM | POA: Diagnosis not present

## 2022-10-10 LAB — LIPID PANEL
Cholesterol: 158 mg/dL (ref 0–200)
HDL: 63.7 mg/dL (ref >39.00)
LDL Cholesterol: 86 mg/dL (ref 0–99)
NonHDL: 94.55
Total CHOL/HDL Ratio: 2
Triglycerides: 41 mg/dL (ref 0.0–149.0)
VLDL: 8.2 mg/dL (ref 0.0–40.0)

## 2022-10-10 LAB — VITAMIN D 25 HYDROXY (VIT D DEFICIENCY, FRACTURES): VITD: 47.69 ng/mL (ref 30.00–100.00)

## 2022-10-10 LAB — CBC WITH DIFFERENTIAL/PLATELET
Basophils Absolute: 0 10*3/uL (ref 0.0–0.1)
Basophils Relative: 0.4 % (ref 0.0–3.0)
Eosinophils Absolute: 0.2 10*3/uL (ref 0.0–0.7)
Eosinophils Relative: 6.3 % — ABNORMAL HIGH (ref 0.0–5.0)
HCT: 40.9 % (ref 36.0–46.0)
Hemoglobin: 13.2 g/dL (ref 12.0–15.0)
Lymphocytes Relative: 41.9 % (ref 12.0–46.0)
Lymphs Abs: 1.3 10*3/uL (ref 0.7–4.0)
MCHC: 32.4 g/dL (ref 30.0–36.0)
MCV: 91.2 fl (ref 78.0–100.0)
Monocytes Absolute: 0.2 10*3/uL (ref 0.1–1.0)
Monocytes Relative: 8.1 % (ref 3.0–12.0)
Neutro Abs: 1.3 10*3/uL — ABNORMAL LOW (ref 1.4–7.7)
Neutrophils Relative %: 43.3 % (ref 43.0–77.0)
Platelets: 188 10*3/uL (ref 150.0–400.0)
RBC: 4.48 Mil/uL (ref 3.87–5.11)
RDW: 13.5 % (ref 11.5–15.5)
WBC: 3 10*3/uL — ABNORMAL LOW (ref 4.0–10.5)

## 2022-10-10 LAB — COMPREHENSIVE METABOLIC PANEL
ALT: 50 U/L — ABNORMAL HIGH (ref 0–35)
AST: 32 U/L (ref 0–37)
Albumin: 4.3 g/dL (ref 3.5–5.2)
Alkaline Phosphatase: 57 U/L (ref 39–117)
BUN: 20 mg/dL (ref 6–23)
CO2: 29 mEq/L (ref 19–32)
Calcium: 9.6 mg/dL (ref 8.4–10.5)
Chloride: 105 mEq/L (ref 96–112)
Creatinine, Ser: 0.64 mg/dL (ref 0.40–1.20)
GFR: 100.33 mL/min (ref >60.00)
Glucose, Bld: 85 mg/dL (ref 70–99)
Potassium: 4.1 mEq/L (ref 3.5–5.1)
Sodium: 139 mEq/L (ref 135–145)
Total Bilirubin: 0.5 mg/dL (ref 0.2–1.2)
Total Protein: 6.9 g/dL (ref 6.0–8.3)

## 2022-10-10 LAB — HEMOGLOBIN A1C: Hgb A1c MFr Bld: 5.9 % (ref 4.6–6.5)

## 2022-10-10 LAB — TSH: TSH: 1.02 u[IU]/mL (ref 0.35–5.50)

## 2022-10-10 MED ORDER — ESCITALOPRAM OXALATE 20 MG PO TABS: 20.0000 mg | ORAL_TABLET | Freq: Every day | ORAL | 3 refills | Status: DC

## 2022-10-10 NOTE — Assessment & Plan Note (Signed)
Chronic, stable. Continue lexapro 20mg 

## 2022-10-10 NOTE — Patient Instructions (Signed)
I have placed a referral to [facility] psychology for further evaluation of ADHD, concentration deficit  Let us know if you dont hear back within a week in regards to an appointment being scheduled.   So that you are aware, if you are [facility] MyChart user , please pay attention to your MyChart messages as you may receive a MyChart message with a phone number to call and schedule this test/appointment own your own from our referral coordinator. This is a new process so I do not want you to miss this message.  If you are not a MyChart user, you will receive a phone call.

## 2022-10-10 NOTE — Progress Notes (Signed)
Assessment & Plan:  Concentration deficit  Anxiety Assessment & Plan: Chronic, stable. Continue lexapro 20mg   Orders: -     Escitalopram Oxalate; Take 1 tablet (20 mg total) by mouth daily.  Dispense: 90 tablet; Refill: 3 -     TSH -     CBC with Differential/Platelet -     Comprehensive metabolic panel -     Hemoglobin A1c -     Lipid panel -     VITAMIN D 25 Hydroxy (Vit-D Deficiency, Fractures) -     Ambulatory referral to Psychiatry  Encounter for lipid screening for cardiovascular disease -     Lipid panel  Prediabetes -     TSH -     Hemoglobin A1c     Return precautions given.   Risks, benefits, and alternatives of the medications and treatment plan prescribed today were discussed, and patient expressed understanding.   Education regarding symptom management and diagnosis given to patient on AVS either electronically or printed.  Return in about 1 year (around 10/10/2023).  Rennie Plowman, FNP  Subjective:    Patient ID: Margaret Mcfarland, female    DOB: [birth year], 52 y.o.   MRN: [id number]  CC: Mazie Lasseter is a 52 y.o. female who presents today for follow up.   HPI: Accompanied by her sister today She has concerns in regards to concentration She notices at home that she has trouble focusing, sometimes at work.  Otherwise, she feels well today.  She would like screening labs done  No depression, anxiety.         Allergies: Sertraline Current Outpatient Medications on File Prior to Visit  Medication Sig Dispense Refill   Calcium Carbonate-Vitamin D (CALCIUM 600+D PO) Take by mouth daily.     meloxicam (MOBIC) 15 MG tablet Take 15 mg by mouth daily. I tablet daily as needed     Multiple Vitamin (MULTI-VITAMIN) tablet Take 1 tablet by mouth daily.     No current facility-administered medications on file prior to visit.    Review of Systems  Constitutional:  Negative for chills and fever.  Respiratory:  Negative for cough.   Cardiovascular:  Negative  for chest pain and palpitations.  Gastrointestinal:  Negative for nausea and vomiting.      Objective:    BP 110/72   Pulse 75   Temp (!) 97.5 F (36.4 C) (Oral)   Ht 5\' 9"  (1.753 m)   Wt 119 lb 3.2 oz (54.1 kg)   LMP  (LMP Unknown)   SpO2 98%   BMI 17.60 kg/m  BP Readings from Last 3 Encounters:  10/10/22 110/72  10/23/21 (!) 95/59  10/06/21 118/80   Wt Readings from Last 3 Encounters:  10/10/22 119 lb 3.2 oz (54.1 kg)  10/23/21 130 lb 6.4 oz (59.1 kg)  10/06/21 128 lb 12.8 oz (58.4 kg)    Physical Exam Vitals reviewed.  Constitutional:      Appearance: She is well-developed.  Eyes:     Conjunctiva/sclera: Conjunctivae normal.  Cardiovascular:     Rate and Rhythm: Normal rate and regular rhythm.     Pulses: Normal pulses.     Heart sounds: Normal heart sounds.  Pulmonary:     Effort: Pulmonary effort is normal.     Breath sounds: Normal breath sounds. No wheezing, rhonchi or rales.  Skin:    General: Skin is warm and dry.  Neurological:     Mental Status: She is alert.  Psychiatric:  Speech: Speech normal.        Behavior: Behavior normal.        Thought Content: Thought content normal.       10/10/2022   10:24 AM 10/06/2021    1:30 PM 09/13/2021   11:11 AM  Depression screen PHQ 2/9  Decreased Interest 0 0 0  Down, Depressed, Hopeless 0 0 0  PHQ - 2 Score 0 0 0

## 2022-10-15 ENCOUNTER — Ambulatory Visit: Payer: BC Managed Care – PPO | Admitting: Family

## 2022-10-17 NOTE — Addendum Note (Signed)
Addended by: [place], Nancye Grumbine on: 10/17/2022 09:00 AM   Modules accepted: Orders

## 2022-10-18 ENCOUNTER — Encounter (INDEPENDENT_AMBULATORY_CARE_PROVIDER_SITE_OTHER): Payer: Self-pay

## 2022-10-18 ENCOUNTER — Ambulatory Visit (INDEPENDENT_AMBULATORY_CARE_PROVIDER_SITE_OTHER): Payer: BC Managed Care – PPO | Admitting: Vascular Surgery

## 2022-11-22 ENCOUNTER — Ambulatory Visit: Payer: BC Managed Care – PPO | Admitting: Family

## 2022-11-22 ENCOUNTER — Other Ambulatory Visit (INDEPENDENT_AMBULATORY_CARE_PROVIDER_SITE_OTHER): Payer: BC Managed Care – PPO

## 2022-11-22 DIAGNOSIS — R7303 Prediabetes: Secondary | ICD-10-CM | POA: Diagnosis not present

## 2022-11-23 LAB — COMPREHENSIVE METABOLIC PANEL
ALT: 34 U/L (ref 0–35)
AST: 28 U/L (ref 0–37)
Albumin: 4.3 g/dL (ref 3.5–5.2)
Alkaline Phosphatase: 58 U/L (ref 39–117)
BUN: 26 mg/dL — ABNORMAL HIGH (ref 6–23)
CO2: 30 mEq/L (ref 19–32)
Calcium: 9.8 mg/dL (ref 8.4–10.5)
Chloride: 105 mEq/L (ref 96–112)
Creatinine, Ser: 0.9 mg/dL (ref 0.40–1.20)
GFR: 72.56 mL/min (ref >60.00)
Glucose, Bld: 96 mg/dL (ref 70–99)
Potassium: 4.3 mEq/L (ref 3.5–5.1)
Sodium: 142 mEq/L (ref 135–145)
Total Bilirubin: 0.5 mg/dL (ref 0.2–1.2)
Total Protein: 7 g/dL (ref 6.0–8.3)

## 2022-11-23 LAB — CBC WITH DIFFERENTIAL/PLATELET
Basophils Absolute: 0 10*3/uL (ref 0.0–0.1)
Basophils Relative: 0.6 % (ref 0.0–3.0)
Eosinophils Absolute: 0.1 10*3/uL (ref 0.0–0.7)
Eosinophils Relative: 2.2 % (ref 0.0–5.0)
HCT: 41 % (ref 36.0–46.0)
Hemoglobin: 13.3 g/dL (ref 12.0–15.0)
Lymphocytes Relative: 38.1 % (ref 12.0–46.0)
Lymphs Abs: 1.8 10*3/uL (ref 0.7–4.0)
MCHC: 32.5 g/dL (ref 30.0–36.0)
MCV: 92.8 fl (ref 78.0–100.0)
Monocytes Absolute: 0.4 10*3/uL (ref 0.1–1.0)
Monocytes Relative: 8.6 % (ref 3.0–12.0)
Neutro Abs: 2.4 10*3/uL (ref 1.4–7.7)
Neutrophils Relative %: 50.5 % (ref 43.0–77.0)
Platelets: 193 10*3/uL (ref 150.0–400.0)
RBC: 4.41 Mil/uL (ref 3.87–5.11)
RDW: 13.6 % (ref 11.5–15.5)
WBC: 4.8 10*3/uL (ref 4.0–10.5)

## 2022-12-10 ENCOUNTER — Ambulatory Visit: Payer: BC Managed Care – PPO | Admitting: Physician Assistant

## 2022-12-17 ENCOUNTER — Ambulatory Visit
Admission: RE | Admit: 2022-12-17 | Discharge: 2022-12-17 | Disposition: A | Discharge status: Home / Self Care | Payer: BC Managed Care – PPO | Source: Ambulatory Visit | Attending: Physician Assistant | Admitting: Physician Assistant

## 2022-12-17 ENCOUNTER — Encounter: Payer: Self-pay | Admitting: Physician Assistant

## 2022-12-17 ENCOUNTER — Ambulatory Visit
Admission: RE | Admit: 2022-12-17 | Discharge: 2022-12-17 | Disposition: A | Discharge status: Home / Self Care | Payer: BC Managed Care – PPO | Attending: Physician Assistant | Admitting: Physician Assistant

## 2022-12-17 ENCOUNTER — Other Ambulatory Visit: Payer: Self-pay | Admitting: *Deleted

## 2022-12-17 ENCOUNTER — Ambulatory Visit: Payer: BC Managed Care – PPO | Admitting: Physician Assistant

## 2022-12-17 VITALS — BP 105/65 | HR 61 | Ht 69.0 in | Wt 122.0 lb

## 2022-12-17 DIAGNOSIS — R310 Gross hematuria: Secondary | ICD-10-CM

## 2022-12-17 DIAGNOSIS — R102 Pelvic and perineal pain: Secondary | ICD-10-CM | POA: Diagnosis not present

## 2022-12-17 DIAGNOSIS — N200 Calculus of kidney: Secondary | ICD-10-CM

## 2022-12-17 LAB — MICROSCOPIC EXAMINATION

## 2022-12-17 LAB — URINALYSIS, COMPLETE
Bilirubin, UA: NEGATIVE
Glucose, UA: NEGATIVE
Ketones, UA: NEGATIVE
Leukocytes,UA: NEGATIVE
Nitrite, UA: NEGATIVE
Protein,UA: NEGATIVE
RBC, UA: NEGATIVE
Specific Gravity, UA: 1.025 (ref 1.005–1.030)
Urobilinogen, Ur: 0.2 mg/dL (ref 0.2–1.0)
pH, UA: 7 (ref 5.0–7.5)

## 2022-12-17 NOTE — Progress Notes (Signed)
12/17/2022 2:39 PM   Margaret Mcfarland Jan 04, 1971 [id number]  CC: Chief Complaint  Patient presents with   Nephrolithiasis   HPI: Margaret Mcfarland is a 52 y.o. female with PMH nephrolithiasis who presents today for evaluation of intermittent gross hematuria.   Today she reports 2 to 3 months of intermittent gross hematuria that is more brown in color.  She has also noticed pelvic pain and labial burning over the past week.  She is a never smoker.  She started on a bowel regimen in the last 2 weeks for management of chronic constipation per GI.  KUB today difficult to interpret due to overlying stool.  She has 2 stable phleboliths.  Possible small intrarenal stones, cannot exclude as bowel contents.  No radiopaque ureteral stones.  In-office UA and microscopy today pan negative.  PMH: Past Medical History:  Diagnosis Date   Anxiety    Blood in stool    Cataract    Chicken pox    Endometriosis of uterus 05/23/1993   LAP   Genital warts    Kidney disease    per pt medical screening form   Kidney stone    Motion sickness    cars   PONV (postoperative nausea and vomiting)    nausea only   Retinal detachment    Sciatica of left side     Surgical History: Past Surgical History:  Procedure Laterality Date   BREAST BIOPSY Left 05/08/2022   [place] Core Bx Coil Clip - path pending   BREAST BIOPSY Left 05/08/2022   [place] LT BREAST BX W LOC DEV 1ST LESION IMG BX SPEC US GUIDE 05/08/2022 [facility]   CATARACT EXTRACTION W/PHACO Left 06/26/2017   Procedure: CATARACT EXTRACTION PHACO AND INTRAOCULAR LENS PLACEMENT ([facility]) LEFT;  Surgeon: Lockie Mola, MD;  Location: [facility];  Service: Ophthalmology;  Laterality: Left;   COLONOSCOPY N/A 08/25/2014   Procedure: COLONOSCOPY;  Surgeon: Scot Jun, MD;  Location[facility] END[facility]COPY;  Service: Endoscopy;  Laterality: N/A;   endoscopy     GUM SURGERY  2008   laproscopy     RETINAL DETACHMENT SURGERY      Home Medications:   Allergies as of 12/17/2022       Reactions   Sertraline Diarrhea        Medication List        Accurate as of December 17, 2022  2:39 PM. If you have any questions, ask your nurse or doctor.          CALCIUM 600+D PO Take by mouth daily.   escitalopram 20 MG tablet Commonly known as: Lexapro Take 1 tablet (20 mg total) by mouth daily. What changed: additional instructions   meloxicam 15 MG tablet Commonly known as: MOBIC Take 15 mg by mouth daily. I tablet daily as needed   Multi-Vitamin tablet Take 1 tablet by mouth daily.        Allergies:  Allergies  Allergen Reactions   Sertraline Diarrhea    Family History: Family History  Problem Relation Age of Onset   Colon cancer Father 25   Arthritis Father    Cancer Father        colon/prostate   Hypertension Father    Arthritis Mother    Hyperlipidemia Mother    Heart disease Mother    Stroke Mother    Hypertension Mother    Kidney disease Mother    Diabetes Mother        Type 1   Congestive Heart Failure  Mother    Stroke Maternal Grandmother    Cancer Maternal Grandfather        prostate   Stroke Paternal Grandfather    Breast cancer Cousin 21       has contact    Social History:   reports that she has never smoked. She has never used smokeless tobacco. She reports that she does not drink alcohol and does not use drugs.  Physical Exam: BP 105/65   Pulse 61   Ht 5\' 9"  (1.753 m)   Wt 122 lb (55.3 kg)   BMI 18.02 kg/m   Constitutional:  Alert and oriented, no acute distress, nontoxic appearing HEENT: [place], AT Cardiovascular: No clubbing, cyanosis, or edema Respiratory: Normal respiratory effort, no increased work of breathing Skin: No rashes, bruises or suspicious lesions Neurologic: Grossly intact, no focal deficits, moving all 4 extremities Psychiatric: Normal mood and affect  Laboratory Data: Results for orders placed or performed in visit on 12/17/22  Microscopic Examination   Urine   Result Value Ref Range   WBC, UA 0-5 0 - 5 /hpf   RBC, Urine 0-2 0 - 2 /hpf   Epithelial Cells (non renal) 0-10 0 - 10 /hpf   Crystals Present (A) N/A   Crystal Type Amorphous Sediment N/A   Bacteria, UA Few None seen/Few  Urinalysis, Complete  Result Value Ref Range   Specific Gravity, UA 1.025 1.005 - 1.030   pH, UA 7.0 5.0 - 7.5   Color, UA Yellow Yellow   Appearance Ur Hazy (A) Clear   Leukocytes,UA Negative Negative   Protein,UA Negative Negative/Trace   Glucose, UA Negative Negative   Ketones, UA Negative Negative   RBC, UA Negative Negative   Bilirubin, UA Negative Negative   Urobilinogen, Ur 0.2 0.2 - 1.0 mg/dL   Nitrite, UA Negative Negative   Microscopic Examination See below:    Pertinent Imaging: KUB, 12/17/2022: CLINICAL DATA:  Dysuria, hematuria.   EXAM: ABDOMEN - 1 VIEW   COMPARISON:  01/25/2021.   FINDINGS: The bowel gas pattern is normal. No radio-opaque calculi or other significant radiographic abnormality are seen.   IMPRESSION: Negative.     Electronically Signed   By: Layla Maw M.D.   On: 01/02/2023 15:19   I personally reviewed the images referenced above and note no radiopaque ureteral stones.  Assessment & Plan:   1. Gross hematuria 2 to 3 months of intermittent gross hematuria.  KUB today with no radiopaque ureteral stones.  UA is bland.  I recommended CT urogram for further evaluation, especially since KUB interpretation is challenging due to overlying stool.  Will consider cystoscopy per results. - Urinalysis, Complete - CT HEMATURIA WORKUP; Future  2. Pelvic pain in female UA is bland, low suspicion for UTI.  I think this is secondary to her bowel regimen and chronic constipation.  She agrees that this is the most likely.  Return for Will call with results.  Carman Ching, PA-C  [facility] [place] [address] [place], [place] [address] [phone]

## 2022-12-24 NOTE — Progress Notes (Deleted)
Psychiatric Initial Adult Assessment   Patient Identification: Margaret Mcfarland MRN:  [id number] Date of Evaluation:  12/24/2022 Referral Source: *** Chief Complaint:  No chief complaint on file.  Visit Diagnosis: No diagnosis found.  History of Present Illness:   Margaret Mcfarland is a 52 y.o. year old female with a history of anxiety, prediabetes, lymphedema, hyperlipidemia, who is referred for anxiety.        Associated Signs/Symptoms: Depression Symptoms:  {DEPRESSION SYMPTOMS:20000} (Hypo) Manic Symptoms:  {BHH MANIC SYMPTOMS:22872} Anxiety Symptoms:  {BHH ANXIETY SYMPTOMS:22873} Psychotic Symptoms:  {BHH PSYCHOTIC SYMPTOMS:22874} PTSD Symptoms: {BHH PTSD SYMPTOMS:22875}  Past Psychiatric History:  Outpatient:  Psychiatry admission:  Previous suicide attempt:  Past trials of medication:  History of violence:  History of head injury:   Previous Psychotropic Medications: {YES/NO:21197}  Substance Abuse History in the last 12 months:  {yes no:314532}  Consequences of Substance Abuse: {BHH CONSEQUENCES OF SUBSTANCE ABUSE:22880}  Past Medical History:  Past Medical History:  Diagnosis Date   Anxiety    Blood in stool    Cataract    Chicken pox    Endometriosis of uterus 05/23/1993   LAP   Genital warts    Kidney disease    per pt medical screening form   Kidney stone    Motion sickness    cars   PONV (postoperative nausea and vomiting)    nausea only   Retinal detachment    Sciatica of left side     Past Surgical History:  Procedure Laterality Date   BREAST BIOPSY Left 05/08/2022   [place] Core Bx Coil Clip - path pending   BREAST BIOPSY Left 05/08/2022   [place] LT BREAST BX W LOC DEV 1ST LESION IMG BX SPEC US GUIDE 05/08/2022 [facility]   CATARACT EXTRACTION W/PHACO Left 06/26/2017   Procedure: CATARACT EXTRACTION PHACO AND INTRAOCULAR LENS PLACEMENT (IOC) LEFT;  Surgeon: Lockie Mola, MD;  Location: [facility];  Service: Ophthalmology;  Laterality:  Left;   COLONOSCOPY N/A 08/25/2014   Procedure: COLONOSCOPY;  Surgeon: Scot Jun, MD;  Location: [facility] END[facility]COPY;  Service: Endoscopy;  Laterality: N/A;   endoscopy     GUM SURGERY  2008   laproscopy     RETINAL DETACHMENT SURGERY      Family Psychiatric History: ***  Family History:  Family History  Problem Relation Age of Onset   Colon cancer Father 20   Arthritis Father    Cancer Father        colon/prostate   Hypertension Father    Arthritis Mother    Hyperlipidemia Mother    Heart disease Mother    Stroke Mother    Hypertension Mother    Kidney disease Mother    Diabetes Mother        Type 1   Congestive Heart Failure Mother    Stroke Maternal Grandmother    Cancer Maternal Grandfather        prostate   Stroke Paternal Grandfather    Breast cancer Cousin 35       has contact    Social History:   Social History   Socioeconomic History   Marital status: Single    Spouse name: Not on file   Number of children: Not on file   Years of education: Not on file   Highest education level: Not on file  Occupational History   Not on file  Tobacco Use   Smoking status: Never   Smokeless tobacco: Never  Vaping Use   Vaping status:  Never Used  Substance and Sexual Activity   Alcohol use: No    Alcohol/week: 0.0 standard drinks of alcohol   Drug use: No   Sexual activity: Never  Other Topics Concern   Not on file  Social History Narrative   Lives with twin sister   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    No pets    No children    Right handed    No caffeine daily- tea occasionally; eats chocolate    Enjoys- shopping and eating out    Social Determinants of Corporate investment banker Strain: Not on file  Food Insecurity: Not on file  Transportation Needs: Not on file  Physical Activity: Not on file  Stress: Not on file  Social Connections: Not on file    Additional Social History: ***  Allergies:   Allergies  Allergen Reactions    Sertraline Diarrhea    Metabolic Disorder Labs: Lab Results  Component Value Date   HGBA1C 5.9 10/10/2022   No results found for: "PROLACTIN" Lab Results  Component Value Date   CHOL 158 10/10/2022   TRIG 41.0 10/10/2022   HDL 63.70 10/10/2022   CHOLHDL 2 10/10/2022   VLDL 8.2 10/10/2022   LDLCALC 86 10/10/2022   LDLCALC 90 10/03/2021   Lab Results  Component Value Date   TSH 1.02 10/10/2022    Therapeutic Level Labs: No results found for: "LITHIUM" No results found for: "CBMZ" No results found for: "VALPROATE"  Current Medications: Current Outpatient Medications  Medication Sig Dispense Refill   Calcium Carbonate-Vitamin D (CALCIUM 600+D PO) Take by mouth daily.     escitalopram (LEXAPRO) 20 MG tablet Take 1 tablet (20 mg total) by mouth daily. (Patient taking differently: Take 20 mg by mouth daily. Pt is taking one  10 mg daily instead of the  20 mg tablet) 90 tablet 3   meloxicam (MOBIC) 15 MG tablet Take 15 mg by mouth daily. I tablet daily as needed     Multiple Vitamin (MULTI-VITAMIN) tablet Take 1 tablet by mouth daily.     No current facility-administered medications for this visit.    Musculoskeletal: Strength & Muscle Tone: within normal limits Gait & Station: normal Patient leans: N/A  Psychiatric Specialty Exam: Review of Systems  There were no vitals taken for this visit.There is no height or weight on file to calculate BMI.  General Appearance: {Appearance:22683}  Eye Contact:  {BHH EYE CONTACT:22684}  Speech:  Clear and Coherent  Volume:  Normal  Mood:  {BHH MOOD:22306}  Affect:  {Affect (PAA):22687}  Thought Process:  Coherent  Orientation:  Full (Time, Place, and Person)  Thought Content:  Logical  Suicidal Thoughts:  {ST/HT (PAA):22692}  Homicidal Thoughts:  {ST/HT (PAA):22692}  Memory:  Immediate;   Good  Judgement:  {Judgement (PAA):22694}  Insight:  {Insight (PAA):22695}  Psychomotor Activity:  Normal  Concentration:  Concentration:  Good and Attention Span: Good  Recall:  Good  Fund of Knowledge:Good  Language: Good  Akathisia:  No  Handed:  Right  AIMS (if indicated):  not done  Assets:  Communication Skills Desire for Improvement  ADL's:  Intact  Cognition: WNL  Sleep:  {BHH GOOD/FAIR/POOR:22877}   Screenings: GAD-7    Garment/textile technologist Visit from 10/06/2021 in [facility] Visit from 09/13/2021 in [facility]  Total GAD-7 Score 0 0      PHQ2-9    Flowsheet Row Office Visit from  10/10/2022 in [facility] Visit from 10/06/2021 in [facility] Visit from 09/13/2021 in [facility] Visit from 09/28/2020 in [facility] Visit from 08/28/2019 in [facility] [employer] [facility]  PHQ-2 Total Score 0 0 0 0 0  PHQ-9 Total Score -- -- -- 3 --       Assessment and Plan:  Assessment  Plan   The patient demonstrates the following risk factors for suicide: Chronic risk factors for suicide include: {Chronic Risk Factors for UEAVWUJ:81191478}. Acute risk factors for suicide include: {Acute Risk Factors for GNFAOZH:08657846}. Protective factors for this patient include: {Protective Factors for Suicide NGEX:52841324}. Considering these factors, the overall suicide risk at this point appears to be {Desc; low/moderate/high:110033}. Patient {ACTION; IS/IS MWN:02725366} appropriate for outpatient follow up.   Collaboration of Care: {BH OP Collaboration of Care:21014065}  Patient/Guardian was advised Release of Information must be obtained prior to any record release in order to collaborate their care with an outside provider. Patient/Guardian was advised if they have not already done so to contact the registration department to sign all  necessary forms in order for [place] to release information regarding their care.   Consent: Patient/Guardian gives verbal consent for treatment and assignment of benefits for services provided during this visit. Patient/Guardian expressed understanding and agreed to proceed.   Neysa Hotter, MD 10/21/202412:58 PM

## 2022-12-31 ENCOUNTER — Ambulatory Visit: Payer: Self-pay | Admitting: Psychiatry

## 2023-01-01 ENCOUNTER — Ambulatory Visit: Payer: Self-pay | Admitting: Psychiatry

## 2023-01-14 ENCOUNTER — Ambulatory Visit: Admission: RE | Admit: 2023-01-14 | Payer: BC Managed Care – PPO | Source: Ambulatory Visit

## 2023-01-14 ENCOUNTER — Ambulatory Visit
Admission: RE | Admit: 2023-01-14 | Discharge: 2023-01-14 | Disposition: A | Discharge status: Home / Self Care | Payer: BC Managed Care – PPO | Source: Ambulatory Visit | Attending: Physician Assistant | Admitting: Physician Assistant

## 2023-01-14 DIAGNOSIS — R310 Gross hematuria: Secondary | ICD-10-CM | POA: Diagnosis present

## 2023-01-14 MED ORDER — SODIUM CHLORIDE 0.9 % IV BOLUS
250.0000 mL | Freq: Once | INTRAVENOUS | Status: AC
  Administered 2023-01-14: 250 mL via INTRAVENOUS

## 2023-01-14 MED ORDER — IOHEXOL 300 MG/ML  SOLN
125.0000 mL | Freq: Once | INTRAMUSCULAR | Status: AC | PRN
  Administered 2023-01-14: 125 mL via INTRAVENOUS

## 2023-01-17 DIAGNOSIS — M533 Sacrococcygeal disorders, not elsewhere classified: Secondary | ICD-10-CM | POA: Insufficient documentation

## 2023-02-11 ENCOUNTER — Ambulatory Visit: Payer: BC Managed Care – PPO | Admitting: Physician Assistant

## 2023-02-18 ENCOUNTER — Ambulatory Visit (INDEPENDENT_AMBULATORY_CARE_PROVIDER_SITE_OTHER): Payer: BC Managed Care – PPO

## 2023-02-18 ENCOUNTER — Ambulatory Visit: Payer: BC Managed Care – PPO | Admitting: Podiatry

## 2023-02-18 ENCOUNTER — Encounter: Payer: Self-pay | Admitting: Podiatry

## 2023-02-18 DIAGNOSIS — M7752 Other enthesopathy of left foot: Secondary | ICD-10-CM | POA: Diagnosis not present

## 2023-02-18 DIAGNOSIS — S93602A Unspecified sprain of left foot, initial encounter: Secondary | ICD-10-CM | POA: Diagnosis not present

## 2023-02-18 MED ORDER — TRIAMCINOLONE ACETONIDE 40 MG/ML IJ SUSP
20.0000 mg | Freq: Once | INTRAMUSCULAR | Status: AC
  Administered 2023-02-18: 20 mg

## 2023-02-18 NOTE — Progress Notes (Signed)
She presents today chief complaint of pain to the dorsal lateral aspect of the left foot.  Denies any trauma.  She states that it really hurts pressure which develops in her foot.  Noticed a callus subfifth metatarsal head of the left foot.  No injury.  Objective: Vital signs are stable she is alert and oriented x 3.  Pulses are palpable.  Tailor's bunion deformity resulting in a reactive hyperkeratotic lesion plantar aspect of the fifth metatarsal and pain on palpation of the proximal intermetatarsal ligament.  Assessment: Sprain of the proximal intermetatarsal ligament left.  Plan: Discussed appropriate shoe gear with her injected the fourth interdigital space proximally with local anesthetic and Kenalog.  Tolerated procedure well without complications follow-up with her on an as-needed basis.

## 2023-02-19 ENCOUNTER — Other Ambulatory Visit: Payer: BC Managed Care – PPO | Admitting: Urology

## 2023-02-25 NOTE — Progress Notes (Deleted)
Psychiatric Initial Adult Assessment   Patient Identification: Margaret Mcfarland MRN:  [id number] Date of Evaluation:  02/25/2023 Referral Source: *** Chief Complaint:  No chief complaint on file.  Visit Diagnosis: No diagnosis found.  History of Present Illness:   Margaret Mcfarland is a 52 y.o. year old female with a history of anxiety, who is referred for difficulty in concentration.        ? Difficulty in concentration   Associated Signs/Symptoms: Depression Symptoms:  {DEPRESSION SYMPTOMS:20000} (Hypo) Manic Symptoms:  {BHH MANIC SYMPTOMS:22872} Anxiety Symptoms:  {BHH ANXIETY SYMPTOMS:22873} Psychotic Symptoms:  {BHH PSYCHOTIC SYMPTOMS:22874} PTSD Symptoms: {BHH PTSD SYMPTOMS:22875}  Past Psychiatric History:  Outpatient:  Psychiatry admission:  Previous suicide attempt:  Past trials of medication:  History of violence:  History of head injury:   Previous Psychotropic Medications: {YES/NO:21197}  Substance Abuse History in the last 12 months:  {yes no:314532}  Consequences of Substance Abuse: {BHH CONSEQUENCES OF SUBSTANCE ABUSE:22880}  Past Medical History:  Past Medical History:  Diagnosis Date   Anxiety    Blood in stool    Cataract    Chicken pox    Endometriosis of uterus 05/23/1993   LAP   Genital warts    Kidney disease    per pt medical screening form   Kidney stone    Motion sickness    cars   PONV (postoperative nausea and vomiting)    nausea only   Retinal detachment    Sciatica of left side     Past Surgical History:  Procedure Laterality Date   BREAST BIOPSY Left 05/08/2022   [place] Core Bx Coil Clip - path pending   BREAST BIOPSY Left 05/08/2022   [place] LT BREAST BX W LOC DEV 1ST LESION IMG BX SPEC US GUIDE 05/08/2022 [facility]   CATARACT EXTRACTION W/PHACO Left 06/26/2017   Procedure: CATARACT EXTRACTION PHACO AND INTRAOCULAR LENS PLACEMENT (IOC) LEFT;  Surgeon: Lockie Mola, MD;  Location: [facility];  Service: Ophthalmology;   Laterality: Left;   COLONOSCOPY N/A 08/25/2014   Procedure: COLONOSCOPY;  Surgeon: Scot Jun, MD;  Location: [facility] ENDOSCOPY;  Service: Endoscopy;  Laterality: N/A;   endoscopy     GUM SURGERY  2008   laproscopy     RETINAL DETACHMENT SURGERY      Family Psychiatric History: ***  Family History:  Family History  Problem Relation Age of Onset   Colon cancer Father 45   Arthritis Father    Cancer Father        colon/prostate   Hypertension Father    Arthritis Mother    Hyperlipidemia Mother    Heart disease Mother    Stroke Mother    Hypertension Mother    Kidney disease Mother    Diabetes Mother        Type 1   Congestive Heart Failure Mother    Stroke Maternal Grandmother    Cancer Maternal Grandfather        prostate   Stroke Paternal Grandfather    Breast cancer Cousin 35       has contact    Social History:   Social History   Socioeconomic History   Marital status: Single    Spouse name: Not on file   Number of children: Not on file   Years of education: Not on file   Highest education level: Not on file  Occupational History   Not on file  Tobacco Use   Smoking status: Never   Smokeless tobacco: Never  Vaping  Use   Vaping status: Never Used  Substance and Sexual Activity   Alcohol use: No    Alcohol/week: 0.0 standard drinks of alcohol   Drug use: No   Sexual activity: Never  Other Topics Concern   Not on file  Social History Narrative   Lives with twin sister   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 [employer]    No pets    No children    Right handed    No caffeine daily- tea occasionally; eats chocolate    Enjoys- shopping and eating out    Social Drivers of Corporate investment banker Strain: Not on file  Food Insecurity: Not on file  Transportation Needs: Not on file  Physical Activity: Not on file  Stress: Not on file  Social Connections: Not on file    Additional Social History: ***  Allergies:   Allergies  Allergen Reactions    Sertraline Diarrhea    Metabolic Disorder Labs: Lab Results  Component Value Date   HGBA1C 5.9 10/10/2022   No results found for: "PROLACTIN" Lab Results  Component Value Date   CHOL 158 10/10/2022   TRIG 41.0 10/10/2022   HDL 63.70 10/10/2022   CHOLHDL 2 10/10/2022   VLDL 8.2 10/10/2022   LDLCALC 86 10/10/2022   LDLCALC 90 10/03/2021   Lab Results  Component Value Date   TSH 1.02 10/10/2022    Therapeutic Level Labs: No results found for: "LITHIUM" No results found for: "CBMZ" No results found for: "VALPROATE"  Current Medications: Current Outpatient Medications  Medication Sig Dispense Refill   Calcium Carbonate-Vitamin D (CALCIUM 600+D PO) Take by mouth daily.     escitalopram (LEXAPRO) 20 MG tablet Take 1 tablet (20 mg total) by mouth daily. (Patient taking differently: Take 20 mg by mouth daily. Pt is taking one  10 mg daily instead of the  20 mg tablet) 90 tablet 3   meloxicam (MOBIC) 15 MG tablet Take 15 mg by mouth daily. I tablet daily as needed     Multiple Vitamin (MULTI-VITAMIN) tablet Take 1 tablet by mouth daily.     No current facility-administered medications for this visit.    Musculoskeletal: Strength & Muscle Tone:  normal Gait & Station: normal Patient leans: N/A  Psychiatric Specialty Exam: Review of Systems  There were no vitals taken for this visit.There is no height or weight on file to calculate BMI.  General Appearance: {Appearance:22683}  Eye Contact:  {BHH EYE CONTACT:22684}  Speech:  Clear and Coherent  Volume:  Normal  Mood:  {BHH MOOD:22306}  Affect:  {Affect (PAA):22687}  Thought Process:  Coherent  Orientation:  Full (Time, Place, and Person)  Thought Content:  Logical  Suicidal Thoughts:  {ST/HT (PAA):22692}  Homicidal Thoughts:  {ST/HT (PAA):22692}  Memory:  Immediate;   Good  Judgement:  {Judgement (PAA):22694}  Insight:  {Insight (PAA):22695}  Psychomotor Activity:  Normal  Concentration:  Concentration: Good and  Attention Span: Good  Recall:  Good  Fund of Knowledge:Good  Language: Good  Akathisia:  No  Handed:  Right  AIMS (if indicated):  not done  Assets:  Communication Skills Desire for Improvement  ADL's:  Intact  Cognition: WNL  Sleep:  {BHH GOOD/FAIR/POOR:22877}   Screenings: GAD-7    Garment/textile technologist Visit from 10/06/2021 in [facility] Visit from 09/13/2021 in [facility] [place] [facility]  Total GAD-7 Score 0 0      PHQ2-9    Flowsheet  Row Office Visit from 10/10/2022 in [facility] Visit from 10/06/2021 in [facility] [employer] Visit from 09/13/2021 in [facility] [employer] Visit from 09/28/2020 in [facility] [place] [facility] [employer] Visit from 08/28/2019 in [facility] [employer] [facility]  PHQ-2 Total Score 0 0 0 0 0  PHQ-9 Total Score -- -- -- 3 --       Assessment and Plan:    Plan   The patient demonstrates the following risk factors for suicide: Chronic risk factors for suicide include: {Chronic Risk Factors for ZOXWRUE:45409811}. Acute risk factors for suicide include: {Acute Risk Factors for BJYNWGN:56213086}. Protective factors for this patient include: {Protective Factors for Suicide VHQI:69629528}. Considering these factors, the overall suicide risk at this point appears to be {Desc; low/moderate/high:110033}. Patient {ACTION; IS/IS UXL:24401027} appropriate for outpatient follow up.   Collaboration of Care: {BH OP Collaboration of Care:21014065}  Patient/Guardian was advised Release of Information must be obtained prior to any record release in order to collaborate their care with an outside provider. Patient/Guardian was advised if they have not already done so to contact the registration department to sign all necessary forms in  order for [place] to release information regarding their care.   Consent: Patient/Guardian gives verbal consent for treatment and assignment of benefits for services provided during this visit. Patient/Guardian expressed understanding and agreed to proceed.   Neysa Hotter, MD 12/23/20243:43 PM

## 2023-03-01 ENCOUNTER — Encounter: Payer: Self-pay | Admitting: Family

## 2023-03-04 ENCOUNTER — Other Ambulatory Visit: Payer: Self-pay

## 2023-03-04 ENCOUNTER — Ambulatory Visit: Payer: Self-pay | Admitting: Psychiatry

## 2023-03-04 DIAGNOSIS — F419 Anxiety disorder, unspecified: Secondary | ICD-10-CM

## 2023-03-04 MED ORDER — ESCITALOPRAM OXALATE 20 MG PO TABS: 20.0000 mg | ORAL_TABLET | Freq: Every day | ORAL | 3 refills | Status: DC

## 2023-03-04 NOTE — Telephone Encounter (Signed)
Sent in rx to pharmacy pt has been notified

## 2023-03-05 ENCOUNTER — Encounter: Payer: Self-pay | Admitting: Family

## 2023-03-05 ENCOUNTER — Ambulatory Visit: Payer: BC Managed Care – PPO | Admitting: Family

## 2023-03-05 VITALS — BP 130/76 | HR 70 | Temp 97.5°F | Ht 69.0 in | Wt 123.0 lb

## 2023-03-05 DIAGNOSIS — R140 Abdominal distension (gaseous): Secondary | ICD-10-CM | POA: Diagnosis not present

## 2023-03-05 DIAGNOSIS — R051 Acute cough: Secondary | ICD-10-CM | POA: Diagnosis not present

## 2023-03-05 LAB — POC COVID19 BINAXNOW: SARS Coronavirus 2 Ag: NEGATIVE

## 2023-03-05 LAB — POCT INFLUENZA A/B
Influenza A, POC: NEGATIVE
Influenza B, POC: NEGATIVE

## 2023-03-05 NOTE — Assessment & Plan Note (Addendum)
 Chronic, suboptimal control. Improved with initiation of MiraLAX, Metamucil . Previously screened for celiac, negative.   Differential includes  IBS, constipation, gastroparesis.   Encouraged 2-week trial without lactose.  Provided information on FODMAP diet. Advised symptom diary. Consider trial of PPI at follow up.   Advised follow-up with Dr. Maryruth if her symptoms persist further evaluation including differentials such as pancreatic insufficiency, SIBO.

## 2023-03-05 NOTE — Patient Instructions (Addendum)
 Trial stop lactulose for 2 weeks.  Please keep symptom log and journal   I have ordered a gastric emptying study  Let us  know if you dont hear back within a week in regards to an appointment being scheduled.   So that you are aware, if you are [facility] MyChart user , please pay attention to your MyChart messages as you may receive a MyChart message with a phone number to call and schedule this test/appointment own your own from our referral coordinator. This is a new process so I do not want you to miss this message.  If you are not a MyChart user, you will receive a phone call.   Trial of Beano for bloating if gas is playing a role.  Please call for follow up with Dr Maryruth if symptoms persist.    Diet for Irritable Bowel Syndrome When you have irritable bowel syndrome (IBS), it is very important to follow the eating habits that are best for your condition. IBS may cause various symptoms, such as pain in the abdomen, constipation, or diarrhea. Choosing the right foods can help to ease the discomfort from these symptoms. Work with your health care provider and dietitian to find the eating plan that will help to control your symptoms. What are tips for following this plan?  Keep a food diary. This will help you identify foods that cause symptoms. Write down: What you eat and when you eat it. What symptoms you have. When symptoms occur in relation to your meals, such as pain in abdomen 2 hours after dinner. Eat your meals slowly and in a relaxed setting. Aim to eat 5-6 small meals per day. Do not skip meals. Drink enough fluid to keep your urine pale yellow. Ask your health care provider if you should take an over-the-counter probiotic to help restore healthy bacteria in your gut (digestive tract). Probiotics are foods that contain good bacteria and yeasts. Your dietitian may have specific dietary recommendations for you based on your symptoms. Your dietitian may recommend that you: Avoid  foods that cause symptoms. Talk with your dietitian about other ways to get the same nutrients that are in those problem foods. Avoid foods with gluten. Gluten is a protein that is found in rye, wheat, and barley. Eat more foods that contain soluble fiber. Examples of foods with high soluble fiber include oats, seeds, and certain fruits and vegetables. Take a fiber supplement if told by your dietitian. Reduce or avoid certain foods called FODMAPs. These are foods that contain sugars that are hard for some people to digest. Ask your health care provider which foods to avoid. What foods should I avoid? The following are some foods and drinks that may make your symptoms worse: Fatty foods, such as french fries. Foods that contain gluten, such as pasta and cereal. Dairy products, such as milk, cheese, and ice cream. Spicy foods. Alcohol. Products with caffeine, such as coffee, tea, or chocolate. Carbonated drinks, such as soda. Foods that are high in FODMAPs. These include certain fruits and vegetables. Products with sweeteners such as honey, high fructose corn syrup, sorbitol, and mannitol. The items listed above may not be a complete list of foods and beverages you should avoid. Contact a dietitian for more information. What foods are good sources of fiber? Your health care provider or dietitian may recommend that you eat more foods that contain fiber. Fiber can help to reduce constipation and other IBS symptoms. Add foods with fiber to your diet a little at  a time so your body can get used to them. Too much fiber at one time might cause gas and swelling of your abdomen. The following are some foods that are good sources of fiber: Berries, such as raspberries, strawberries, and blueberries. Tomatoes. Carrots. Brown rice. Oats. Seeds, such as chia and pumpkin seeds. The items listed above may not be a complete list of recommended sources of fiber. Contact your dietitian for more options. Where  to find more information International Foundation for Functional Gastrointestinal Disorders: aboutibs.[employer] of Diabetes and Digestive and Kidney Diseases: stagesync.si Summary When you have irritable bowel syndrome (IBS), it is very important to follow the eating habits that are best for your condition. IBS may cause various symptoms, such as pain in the abdomen, constipation, or diarrhea. Choosing the right foods can help to ease the discomfort that comes from symptoms. Your health care provider or dietitian may recommend that you eat more foods that contain fiber. Keep a food diary. This will help you identify foods that cause symptoms. This information is not intended to replace advice given to you by your health care provider. Make sure you discuss any questions you have with your health care provider. Document Revised: 01/31/2021 Document Reviewed: 01/31/2021 Elsevier Patient Education  2024 Elsevier Inc. Low-FODMAP Eating Plan  FODMAP stands for fermentable oligosaccharides, disaccharides, monosaccharides, and polyols. These are sugars that are hard for some people to digest. A low-FODMAP eating plan may help some people who have irritable bowel syndrome (IBS) and certain other bowel (intestinal) diseases to manage their symptoms. This meal plan can be complicated to follow. Work with a diet and nutrition specialist (dietitian) to make a low-FODMAP eating plan that is right for you. A dietitian can help make sure that you get enough nutrition from this diet. What are tips for following this plan? Reading food labels Check labels for hidden FODMAPs such as: High-fructose syrup. Honey. Agave. Natural fruit flavors. Onion or garlic powder. Choose low-FODMAP foods that contain 3-4 grams of fiber per serving. Check food labels for serving sizes. Eat only one serving at a time to make sure FODMAP levels stay low. Shopping Shop with a list of foods that are recommended on  this diet and make a meal plan. Meal planning Follow a low-FODMAP eating plan for up to 6 weeks, or as told by your health care provider or dietitian. To follow the eating plan: Eliminate high-FODMAP foods from your diet completely. Choose only low-FODMAP foods to eat. You will do this for 2-6 weeks. Gradually reintroduce high-FODMAP foods into your diet one at a time. Most people should wait a few days before introducing the next new high-FODMAP food into their meal plan. Your dietitian can recommend how quickly you may reintroduce foods. Keep a daily record of what and how much you eat and drink. Make note of any symptoms that you have after eating. Review your daily record with a dietitian regularly to identify which foods you can eat and which foods you should avoid. General tips Drink enough fluid each day to keep your urine pale yellow. Avoid processed foods. These often have added sugar and may be high in FODMAPs. Avoid most dairy products, whole grains, and sweeteners. Work with a dietitian to make sure you get enough fiber in your diet. Avoid high FODMAP foods at meals to manage symptoms. Recommended foods Fruits Bananas, oranges, tangerines, lemons, limes, blueberries, raspberries, strawberries, grapes, cantaloupe, honeydew melon, kiwi, papaya, passion fruit, and pineapple. Limited amounts of dried  cranberries, banana chips, and shredded coconut. Vegetables Eggplant, zucchini, cucumber, peppers, green beans, bean sprouts, lettuce, arugula, kale, Swiss chard, spinach, collard greens, bok choy, summer squash, potato, and tomato. Limited amounts of corn, carrot, and sweet potato. Green parts of scallions. Grains Gluten-free grains, such as rice, oats, buckwheat, quinoa, corn, polenta, and millet. Gluten-free pasta, bread, or cereal. Rice noodles. Corn tortillas. Meats and other proteins Unseasoned beef, pork, poultry, or fish. Eggs. [place]. Tofu (firm) and tempeh. Limited amounts of nuts  and seeds, such as almonds, walnuts, brazil nuts, pecans, peanuts, nut butters, pumpkin seeds, chia seeds, and sunflower seeds. Dairy Lactose-free milk, yogurt, and kefir. Lactose-free cottage cheese and ice cream. Non-dairy milks, such as almond, coconut, hemp, and rice milk. Non-dairy yogurt. Limited amounts of goat cheese, brie, mozzarella, parmesan, swiss, and other hard cheeses. Fats and oils Butter-free spreads. Vegetable oils, such as olive, canola, and sunflower oil. Seasoning and other foods Artificial sweeteners with names that do not end in ol, such as aspartame, saccharine, and stevia. Maple syrup, white table sugar, raw sugar, brown sugar, and molasses. Mayonnaise, soy sauce, and tamari. Fresh basil, coriander, parsley, rosemary, and thyme. Beverages Water and mineral water. Sugar-sweetened soft drinks. Small amounts of orange juice or cranberry juice. Black and green tea. Most dry wines. Coffee. The items listed above may not be a complete list of foods and beverages you can eat. Contact a dietitian for more information. Foods to avoid Fruits Fresh, dried, and juiced forms of apple, pear, watermelon, peach, plum, cherries, apricots, blackberries, boysenberries, figs, nectarines, and mango. Avocado. Vegetables Chicory root, artichoke, asparagus, cabbage, snow peas, Brussels sprouts, broccoli, sugar snap peas, mushrooms, celery, and cauliflower. Onions, garlic, leeks, and the white part of scallions. Grains Wheat, including kamut, durum, and semolina. Barley and bulgur. Couscous. Wheat-based cereals. Wheat noodles, bread, crackers, and pastries. Meats and other proteins Fried or fatty meat. Sausage. Cashews and pistachios. Soybeans, baked beans, black beans, chickpeas, kidney beans, fava beans, navy beans, lentils, black-eyed peas, and split peas. Dairy Milk, yogurt, ice cream, and soft cheese. Cream and sour cream. Milk-based sauces. Custard. Buttermilk. Soy milk. Seasoning and  other foods Any sugar-free gum or candy. Foods that contain artificial sweeteners such as sorbitol, mannitol, isomalt, or xylitol. Foods that contain honey, high-fructose corn syrup, or agave. Bouillon, vegetable stock, beef stock, and chicken stock. Garlic and onion powder. Condiments made with onion, such as hummus, chutney, pickles, relish, salad dressing, and salsa. Tomato paste. Beverages Chicory-based drinks. Coffee substitutes. Chamomile tea. Fennel tea. Sweet or fortified wines such as port or sherry. Diet soft drinks made with isomalt, mannitol, maltitol, sorbitol, or xylitol. Apple, pear, and mango juice. Juices with high-fructose corn syrup. The items listed above may not be a complete list of foods and beverages you should avoid. Contact a dietitian for more information. Summary FODMAP stands for fermentable oligosaccharides, disaccharides, monosaccharides, and polyols. These are sugars that are hard for some people to digest. A low-FODMAP eating plan is a short-term diet that helps to ease symptoms of certain bowel diseases. The eating plan usually lasts up to 6 weeks. After that, high-FODMAP foods are reintroduced gradually and one at a time. This can help you find out which foods may be causing symptoms. A low-FODMAP eating plan can be complicated. It is best to work with a dietitian who has experience with this type of plan. This information is not intended to replace advice given to you by your health care provider. Make sure you discuss any questions  you have with your health care provider. Document Revised: 07/09/2019 Document Reviewed: 07/09/2019 Elsevier Patient Education  2024 [employer].

## 2023-03-05 NOTE — Progress Notes (Signed)
 Assessment & Plan:  Abdominal bloating Assessment & Plan: Chronic, suboptimal control. Improved with initiation of MiraLAX, Metamucil . Previously screened for celiac, negative.   Differential includes  IBS, constipation, gastroparesis.   Encouraged 2-week trial without lactose.  Provided information on FODMAP diet. Advised symptom diary. Consider trial of PPI at follow up.   Advised follow-up with Dr. Maryruth if her symptoms persist further evaluation including differentials such as pancreatic insufficiency, SIBO.   Orders: -     H. pylori breath test -     NM GASTRIC EMPTYING; Future  Acute cough -     POC COVID-19 BinaxNow -     POCT Influenza A/B     Return precautions given.   Risks, benefits, and alternatives of the medications and treatment plan prescribed today were discussed, and patient expressed understanding.   Education regarding symptom management and diagnosis given to patient on AVS either electronically or printed.  Return in about 6 weeks (around 04/16/2023).  [name], FNP  Subjective:    Patient ID: Margaret Mcfarland, female    DOB: 1970/09/08, 52 y.o.   MRN: [id number]  CC: Margaret Mcfarland is a 52 y.o. female who presents today for an acute visit.    HPI: Accompanied by sister   she complains of abdominal bloating after eating 'a whole lot', episodic, improved on miralax.  She states she has had stomach issues for years  She states there is no abdominal bloating today  There are times even after a small amount of food, still feels bloated.   No excessive burping or gas.   No unusual weight loss, nausea, vomiting, abdominal cramping.  She notes some relief after BM.   No known food intolerance including lactose.   Appetite is good.   She has tried probiotics  without relief. She is taking Miralax daily and metamucil.   She has a soft formed bowel movement every day ; at times, stool can be more loose while on miralax. No blood in stool.    She also wanted to be tested for covid and flu due to sister having symptoms.           Never smoker Colonoscopies 2016, 1 polyp, internal hemorrhoids. probably repeat in 5 years.  Dr. Viktoria.  Colonoscopy 06/2021   Seen by Dr Maryruth 11/22/22 for bloating. He obtained a Ab Xray and advised to start metamucil.    Celiac disease negative 11/2020 TSH 1.02  Ct hematuria 01/14/23 ; stomach is within normal limits. KUB 12/17/2022 bowel gas pattern is normal Allergies: Sertraline  Current Outpatient Medications on File Prior to Visit  Medication Sig Dispense Refill   Calcium Carbonate-Vitamin D  (CALCIUM 600+D PO) Take by mouth daily.     escitalopram  (LEXAPRO ) 20 MG tablet Take 1 tablet (20 mg total) by mouth daily. 90 tablet 3   meloxicam (MOBIC) 15 MG tablet Take 15 mg by mouth daily. I tablet daily as needed     Multiple Vitamin (MULTI-VITAMIN) tablet Take 1 tablet by mouth daily.     No current facility-administered medications on file prior to visit.    Review of Systems  Constitutional:  Negative for chills and fever.  Respiratory:  Negative for cough.   Cardiovascular:  Negative for chest pain and palpitations.  Gastrointestinal:  Positive for abdominal distention and constipation. Negative for abdominal pain, blood in stool, nausea and vomiting.      Objective:    BP 130/76   Pulse 70   Temp (!) 97.5 F (36.4  C) (Oral)   Ht 5' 9 (1.753 m)   Wt 123 lb (55.8 kg)   SpO2 98%   BMI 18.16 kg/m   BP Readings from Last 3 Encounters:  03/05/23 130/76  12/17/22 105/65  10/10/22 110/72   Wt Readings from Last 3 Encounters:  03/05/23 123 lb (55.8 kg)  12/17/22 122 lb (55.3 kg)  10/10/22 119 lb 3.2 oz (54.1 kg)    Physical Exam Vitals reviewed.  Constitutional:      Appearance: Normal appearance. She is well-developed.  HENT:     Head: Normocephalic and atraumatic.     Right Ear: Hearing, tympanic membrane, ear canal and external ear normal. No decreased  hearing noted. No drainage, swelling or tenderness. No middle ear effusion. No foreign body. Tympanic membrane is not erythematous or bulging.     Left Ear: Hearing, tympanic membrane, ear canal and external ear normal. No decreased hearing noted. No drainage, swelling or tenderness.  No middle ear effusion. No foreign body. Tympanic membrane is not erythematous or bulging.     Nose: Nose normal. No rhinorrhea.     Right Sinus: No maxillary sinus tenderness or frontal sinus tenderness.     Left Sinus: No maxillary sinus tenderness or frontal sinus tenderness.     Mouth/Throat:     Pharynx: Uvula midline. No oropharyngeal exudate or posterior oropharyngeal erythema.     Tonsils: No tonsillar abscesses.  Eyes:     Conjunctiva/sclera: Conjunctivae normal.  Cardiovascular:     Rate and Rhythm: Normal rate and regular rhythm.     Pulses: Normal pulses.     Heart sounds: Normal heart sounds.  Pulmonary:     Effort: Pulmonary effort is normal.     Breath sounds: Normal breath sounds. No wheezing, rhonchi or rales.  Abdominal:     General: Bowel sounds are normal. There is no distension.     Palpations: Abdomen is soft. Abdomen is not rigid. There is no fluid wave or mass.     Tenderness: There is no abdominal tenderness. There is no guarding or rebound.  Lymphadenopathy:     Head:     Right side of head: No submental, submandibular, tonsillar, preauricular, posterior auricular or occipital adenopathy.     Left side of head: No submental, submandibular, tonsillar, preauricular, posterior auricular or occipital adenopathy.     Cervical: No cervical adenopathy.  Skin:    General: Skin is warm and dry.  Neurological:     Mental Status: She is alert.  Psychiatric:        Speech: Speech normal.        Behavior: Behavior normal.        Thought Content: Thought content normal.

## 2023-03-07 ENCOUNTER — Telehealth: Payer: Self-pay

## 2023-03-07 DIAGNOSIS — F419 Anxiety disorder, unspecified: Secondary | ICD-10-CM

## 2023-03-07 LAB — H. PYLORI BREATH TEST: H. pylori Breath Test: NOT DETECTED

## 2023-03-07 MED ORDER — ESCITALOPRAM OXALATE 10 MG PO TABS: 10.0000 mg | ORAL_TABLET | Freq: Every day | ORAL | 3 refills | Status: DC

## 2023-03-07 NOTE — Telephone Encounter (Signed)
 Copied from CRM [id number]. Topic: Clinical - Prescription Issue >> Mar 05, 2023  3:09 PM Burnard DEL wrote: Reason for CRM: Patient called in stating that she was prescribed 20mg  of  , escitalopram  (LEXAPRO ) 20 MG tablet when she only needs 10 mg.She would like to know if new prescription reflecting 10 mg could be sent in for her?  [facility] [id number] [place], [place] - [address]  Phone: [phone] Fax: [phone]

## 2023-03-07 NOTE — Addendum Note (Signed)
 Addended by: Allegra Grana on: 03/07/2023 08:27 AM   Modules accepted: Orders

## 2023-03-11 NOTE — Telephone Encounter (Signed)
 Rx sent in to pharmacy.

## 2023-03-18 ENCOUNTER — Ambulatory Visit: Payer: BC Managed Care – PPO | Admitting: Podiatry

## 2023-03-22 ENCOUNTER — Other Ambulatory Visit: Payer: Self-pay

## 2023-03-22 ENCOUNTER — Encounter: Admission: RE | Admit: 2023-03-22 | Payer: 59 | Source: Ambulatory Visit

## 2023-05-03 ENCOUNTER — Other Ambulatory Visit: Payer: Self-pay

## 2023-05-27 ENCOUNTER — Ambulatory Visit: Payer: Self-pay | Admitting: Podiatry

## 2023-06-04 ENCOUNTER — Ambulatory Visit: Admitting: Nurse Practitioner

## 2023-06-25 ENCOUNTER — Other Ambulatory Visit: Payer: Self-pay | Admitting: Obstetrics and Gynecology

## 2023-06-25 DIAGNOSIS — Z1231 Encounter for screening mammogram for malignant neoplasm of breast: Secondary | ICD-10-CM

## 2023-07-10 ENCOUNTER — Encounter: Payer: Self-pay | Admitting: Internal Medicine

## 2023-07-10 ENCOUNTER — Ambulatory Visit: Payer: 59 | Attending: Internal Medicine | Admitting: Internal Medicine

## 2023-07-10 VITALS — BP 118/68 | HR 46 | Ht 68.0 in | Wt 122.2 lb

## 2023-07-10 DIAGNOSIS — R9431 Abnormal electrocardiogram [ECG] [EKG]: Secondary | ICD-10-CM

## 2023-07-10 DIAGNOSIS — I200 Unstable angina: Secondary | ICD-10-CM

## 2023-07-10 DIAGNOSIS — I429 Cardiomyopathy, unspecified: Secondary | ICD-10-CM

## 2023-07-10 DIAGNOSIS — R001 Bradycardia, unspecified: Secondary | ICD-10-CM | POA: Diagnosis not present

## 2023-07-10 DIAGNOSIS — Z79899 Other long term (current) drug therapy: Secondary | ICD-10-CM

## 2023-07-10 NOTE — Progress Notes (Signed)
 Cardiology Office Note:  .   Date:  07/10/2023  ID:  Armiyah Almeda, DOB 01-Dec-1970, MRN [id number] PCP: Calista Catching, FNP  [facility] Providers Cardiologist:  None     History of Present Illness: .   Shauntea Kady is a 53 y.o. female with history of venous insufficiency, kidney stones, retinal detachment, endometriosis, and anxiety, who presents for reevaluation of chest pain.  I met her once in 2021, at which time she described chest pain when "in a hurry."  Preceding exercise tolerance in 2018 at [facility] clinic was low risk without ischemia or scar.  We discussed repeating an exercise tolerance test and echocardiogram given that LVEF was mildly reduced on prior outside echo in 2018.  However, Ms. Kusnierz wished to defer this and was subsequently lost to follow-up.  Today, Ms. Nauman reports that she has been feeling well but is concerned about the potential for a "stiff heart."  Her twin sister is also a patient of mine and has been noted to have grade 2 diastolic dysfunction by echo.  Ms. Delhomme has been feeling fairly well, denying chest pain, shortness of breath, lightheadedness, and edema.  She notes sporadic palpitations when she is in a hurry.  ROS: See HPI  Studies Reviewed: Aaron Aas   EKG Interpretation Date/Time:  Wednesday Jul 10 2023 14:43:12 EDT Ventricular Rate:  46 PR Interval:  158 QRS Duration:  104 QT Interval:  484 QTC Calculation: 423 R Axis:   109  Text Interpretation: Sinus bradycardia Rightward axis Incomplete right bundle branch block Nonspecific ST abnormality When compared with ECG of 06-Oct-2021 No significant change was found Confirmed by Lovey Crupi, Veryl Gottron [phone]) on 07/10/2023 2:51:59 PM    Exercise tolerance test (05/15/2016, [facility]): Normal study without ischemia or arrhythmia.  The patient exercised 6:44 minutes with a maximal heart rate of 179 bpm (103% MPHR).  No angina was reported.  TTE (05/21/2016): LVEF 45-50% with trivial MR, mild TR, and mild PR.   Normal RV size and function.  Risk Assessment/Calculations:             Physical Exam:   VS:  BP 118/68 (BP Location: Left Arm, Patient Position: Sitting, Cuff Size: Normal)   Pulse (!) 46   Ht 5\' 8"  (1.727 m)   Wt 122 lb 3.2 oz (55.4 kg)   SpO2 99%   BMI 18.58 kg/m    Wt Readings from Last 3 Encounters:  07/10/23 122 lb 3.2 oz (55.4 kg)  03/05/23 123 lb (55.8 kg)  12/17/22 122 lb (55.3 kg)    Heart rate increases to 70 bpm with ambulation in the office.  General:  NAD. Neck: No JVD or HJR. Lungs: Clear to auscultation bilaterally without wheezes or crackles. Heart: Bradycardic but regular without murmurs, rubs, or gallops. Abdomen: Soft, nontender, nondistended. Extremities: No lower extremity edema.  ASSESSMENT AND PLAN: .    Sinus bradycardia, abnormal EKG, and cardiomyopathy: Ms. Balling is asymptomatic but is concerned about potential for a "stiff heart," as her twin sister who is also my patient was previously noted to have grade 2 diastolic dysfunction by echo.  Ms. Vandyck EKG today shows subtle abnormalities including rightward axis and incomplete right bundle branch block.  She also has early significant resting bradycardia though she demonstrates appropriate chronotropic response to ambulation in the office.  Prior outside echocardiogram in 2018 at [facility] clinic reportedly showed mildly reduced LVEF.  We have discussed further evaluation options and have agreed to obtain a transthoracic echocardiogram as  well as to check a BMP, magnesium level, and TSH.    Dispo: Return to clinic as needed based on results of echocardiogram.  Signed, Sammy Crisp, MD

## 2023-07-10 NOTE — Patient Instructions (Signed)
 Medication Instructions:  Your Physician recommend you continue on your current medication as directed.    *If you need a refill on your cardiac medications before your next appointment, please call your pharmacy*  Lab Work: Your provider would like for you to have following labs drawn today BMP, Mag, TSH.   If you have labs (blood work) drawn today and your tests are completely normal, you will receive your results only by: MyChart Message (if you have MyChart) OR A paper copy in the mail If you have any lab test that is abnormal or we need to change your treatment, we will call you to review the results.  Testing/Procedures: ECHO  Your physician has requested that you have an echocardiogram. Echocardiography is a painless test that uses sound waves to create images of your heart. It provides your doctor with information about the size and shape of your heart and how well your heart's chambers and valves are working.   You may receive an ultrasound enhancing agent through an IV if needed to better visualize your heart during the echo. This procedure takes approximately one hour.  There are no restrictions for this procedure.  This will take place at [address] [facility], [place] [address]  Please note: We ask at that you not bring children with you during ultrasound (echo/ vascular) testing. Due to room size and safety concerns, children are not allowed in the ultrasound rooms during exams. Our front office staff cannot provide observation of children in our lobby area while testing is being conducted. An adult accompanying a patient to their appointment will only be allowed in the ultrasound room at the discretion of the ultrasound technician under special circumstances. We apologize for any inconvenience.   Follow-Up: At [facility], you and your health needs are our priority.  As part of our continuing mission to provide you with exceptional heart  care, our providers are all part of one team.  This team includes your primary Cardiologist (physician) and Advanced Practice Providers or APPs (Physician Assistants and Nurse Practitioners) who all work together to provide you with the care you need, when you need it.  Your next appointment:   Follow up as needed  Provider:   Sammy Crisp, MD

## 2023-07-11 LAB — BASIC METABOLIC PANEL WITH GFR
BUN/Creatinine Ratio: 28 — ABNORMAL HIGH (ref 9–23)
BUN: 19 mg/dL (ref 6–24)
CO2: 25 mmol/L (ref 20–29)
Calcium: 10 mg/dL (ref 8.7–10.2)
Chloride: 102 mmol/L (ref 96–106)
Creatinine, Ser: 0.68 mg/dL (ref 0.57–1.00)
Glucose: 87 mg/dL (ref 70–99)
Potassium: 4.3 mmol/L (ref 3.5–5.2)
Sodium: 141 mmol/L (ref 134–144)
eGFR: 103 mL/min/{1.73_m2} (ref >59)

## 2023-07-11 LAB — TSH: TSH: 1.58 u[IU]/mL (ref 0.450–4.500)

## 2023-07-11 LAB — MAGNESIUM: Magnesium: 2.2 mg/dL (ref 1.6–2.3)

## 2023-07-12 ENCOUNTER — Encounter: Payer: Self-pay | Admitting: Internal Medicine

## 2023-07-12 DIAGNOSIS — R9431 Abnormal electrocardiogram [ECG] [EKG]: Secondary | ICD-10-CM | POA: Insufficient documentation

## 2023-07-12 DIAGNOSIS — I429 Cardiomyopathy, unspecified: Secondary | ICD-10-CM | POA: Insufficient documentation

## 2023-07-16 ENCOUNTER — Ambulatory Visit: Payer: Self-pay | Admitting: Internal Medicine

## 2023-07-23 ENCOUNTER — Encounter (INDEPENDENT_AMBULATORY_CARE_PROVIDER_SITE_OTHER): Payer: Self-pay

## 2023-08-29 ENCOUNTER — Ambulatory Visit: Attending: Internal Medicine

## 2023-08-29 DIAGNOSIS — I429 Cardiomyopathy, unspecified: Secondary | ICD-10-CM

## 2023-08-29 DIAGNOSIS — R9431 Abnormal electrocardiogram [ECG] [EKG]: Secondary | ICD-10-CM

## 2023-08-29 LAB — ECHOCARDIOGRAM COMPLETE
AR max vel: 2.63 cm2
AV Area VTI: 2.71 cm2
AV Area mean vel: 2.58 cm2
AV Mean grad: 3 mmHg
AV Peak grad: 4.8 mmHg
Ao pk vel: 1.09 m/s
Area-P 1/2: 3.53 cm2
S' Lateral: 2.93 cm

## 2023-08-30 ENCOUNTER — Encounter

## 2023-09-04 ENCOUNTER — Encounter

## 2023-09-12 ENCOUNTER — Encounter: Payer: Self-pay | Admitting: Family

## 2023-09-12 ENCOUNTER — Other Ambulatory Visit: Payer: Self-pay | Admitting: Family

## 2023-09-12 DIAGNOSIS — T753XXA Motion sickness, initial encounter: Secondary | ICD-10-CM

## 2023-09-12 MED ORDER — SCOPOLAMINE 1 MG/3DAYS TD PT72: 1.0000 | MEDICATED_PATCH | TRANSDERMAL | Status: AC

## 2023-09-12 MED ORDER — SCOPOLAMINE 1 MG/3DAYS TD PT72: 1.0000 | MEDICATED_PATCH | TRANSDERMAL | 0 refills | Status: DC

## 2023-09-17 ENCOUNTER — Other Ambulatory Visit: Payer: Self-pay

## 2023-09-17 ENCOUNTER — Telehealth: Payer: Self-pay

## 2023-09-17 DIAGNOSIS — T753XXA Motion sickness, initial encounter: Secondary | ICD-10-CM

## 2023-09-17 MED ORDER — SCOPOLAMINE 1 MG/3DAYS TD PT72: 1.0000 | MEDICATED_PATCH | TRANSDERMAL | 0 refills | Status: AC

## 2023-09-17 NOTE — Telephone Encounter (Signed)
 Copied from CRM [id number]. Topic: Clinical - Prescription Issue >> Sep 16, 2023  4:59 PM Deleta RAMAN wrote: Reason for CRM: patient is calling because motion sickness patch was sent to [facility] on [address] [place], [place]  [id number] there's no motion sickness patch called in at this location for the patient. She wants the patch as soon as possible and can be reached at [phone]

## 2023-09-17 NOTE — Telephone Encounter (Signed)
 LVM to inform pt that patch was sent to [facility] on [address]

## 2023-09-30 ENCOUNTER — Ambulatory Visit
Admission: RE | Admit: 2023-09-30 | Discharge: 2023-09-30 | Disposition: A | Discharge status: Home / Self Care | Source: Ambulatory Visit | Attending: Obstetrics and Gynecology | Admitting: Obstetrics and Gynecology

## 2023-09-30 DIAGNOSIS — Z1231 Encounter for screening mammogram for malignant neoplasm of breast: Secondary | ICD-10-CM | POA: Insufficient documentation

## 2023-10-14 ENCOUNTER — Ambulatory Visit: Admitting: Family

## 2023-10-15 ENCOUNTER — Encounter: Payer: Self-pay | Admitting: Family

## 2023-10-15 ENCOUNTER — Ambulatory Visit (INDEPENDENT_AMBULATORY_CARE_PROVIDER_SITE_OTHER): Admitting: Family

## 2023-10-15 ENCOUNTER — Telehealth: Payer: Self-pay | Admitting: Family

## 2023-10-15 VITALS — BP 128/72 | HR 78 | Temp 97.2°F | Ht 69.0 in | Wt 121.0 lb

## 2023-10-15 DIAGNOSIS — F419 Anxiety disorder, unspecified: Secondary | ICD-10-CM

## 2023-10-15 DIAGNOSIS — E785 Hyperlipidemia, unspecified: Secondary | ICD-10-CM | POA: Diagnosis not present

## 2023-10-15 MED ORDER — ESCITALOPRAM OXALATE 10 MG PO TABS
10.0000 mg | ORAL_TABLET | Freq: Every day | ORAL | 3 refills | Status: AC
Start: 2023-10-15 — End: ?

## 2023-10-15 NOTE — Telephone Encounter (Signed)
 Call [facility] Need colonoscopy report from 2023

## 2023-10-15 NOTE — Progress Notes (Signed)
 Assessment & Plan:  Hyperlipidemia, unspecified hyperlipidemia type -     VITAMIN D  25 Hydroxy (Vit-D Deficiency, Fractures); Future -     Hemoglobin A1c; Future -     CBC with Differential/Platelet; Future -     Comprehensive metabolic panel with GFR; Future -     Lipid panel; Future -     TSH; Future  Anxiety Assessment & Plan: Chronic, stable. Continue lexapro  10mg  every day.   Orders: -     TSH; Future -     Escitalopram  Oxalate; Take 1 tablet (10 mg total) by mouth daily.  Dispense: 90 tablet; Refill: 3     Return precautions given.   Risks, benefits, and alternatives of the medications and treatment plan prescribed today were discussed, and patient expressed understanding.   Education regarding symptom management and diagnosis given to patient on AVS either electronically or printed.  No follow-ups on file.  [name], FNP  Subjective:    Patient ID: Margaret Mcfarland, female    DOB: [birth year], 53 y.o.   MRN: [id number]  CC: Margaret Mcfarland is a 53 y.o. female who presents today for medication follow up.   HPI: HPI  Discussed the use of AI scribe software for clinical note transcription with the patient, who gave verbal consent to proceed.  History of Present Illness   Margaret Mcfarland is a 53 year old female who presents for a follow-up visit. She is accompanied by her twin sister. She is compliant with Lexapro  10 mg daily She recently underwent a repeat cardiac ultrasound in June, which confirmed the presence of mitral valve prolapse. She is scheduled for a follow-up with her cardiologist in September.  Her usual exercise routine of walking for 30 minutes has been disrupted due to her sister's injury, as she typically walks together.  She would like screening labs including blood sugar and cholesterol ordered  Mammogram is UTD Pap 03/08/21 NILM, neg HPV; Dr Verdon Echocardiogram Dr End 08/29/23 w/o evidence of diastolic dysfunction; f/u sch 0/82/74  Allergies:  Sertraline  Current Outpatient Medications on File Prior to Visit  Medication Sig Dispense Refill   Calcium Carbonate-Vitamin D  (CALCIUM 600+D PO) Take by mouth daily.     ketorolac (ACULAR) 0.5 % ophthalmic solution SMARTSIG:In Eye(s)     Multiple Vitamin (MULTI-VITAMIN) tablet Take 1 tablet by mouth daily.     scopolamine  (TRANSDERM-SCOP) 1 MG/3DAYS Place 1 patch (1.5 mg total) onto the skin every 3 (three) days. 5 patch 0   Current Facility-Administered Medications on File Prior to Visit  Medication Dose Route Frequency Provider Last Rate Last Admin   scopolamine  (TRANSDERM-SCOP) 1 MG/3DAYS 1.5 mg  1 patch Transdermal Q72H Northern Rollene MATSU, FNP        Review of Systems  Constitutional:  Negative for chills and fever.  Respiratory:  Negative for cough.   Cardiovascular:  Negative for chest pain and palpitations.  Gastrointestinal:  Negative for nausea and vomiting.      Objective:    BP 128/72   Pulse 78   Temp (!) 97.2 F (36.2 C) (Oral)   Ht 5' 9 (1.753 m)   Wt 121 lb (54.9 kg)   LMP  (LMP Unknown)   SpO2 97%   BMI 17.87 kg/m  BP Readings from Last 3 Encounters:  10/15/23 128/72  07/10/23 118/68  03/05/23 130/76   Wt Readings from Last 3 Encounters:  10/15/23 121 lb (54.9 kg)  07/10/23 122 lb 3.2 oz (55.4 kg)  03/05/23 123 lb (55.8  kg)      10/15/2023    9:10 AM 03/05/2023   11:37 AM 10/10/2022   10:24 AM  Depression screen PHQ 2/9  Decreased Interest 1 0 0  Down, Depressed, Hopeless 1 0 0  PHQ - 2 Score 2 0 0  Altered sleeping 1    Tired, decreased energy 1    Change in appetite 0    Feeling bad or failure about yourself  0    Trouble concentrating 1    Moving slowly or fidgety/restless 0    Suicidal thoughts 0    PHQ-9 Score 5    Difficult doing work/chores Somewhat difficult      Physical Exam Vitals reviewed.  Constitutional:      Appearance: She is well-developed.  Eyes:     Conjunctiva/sclera: Conjunctivae normal.  Cardiovascular:     Rate  and Rhythm: Normal rate and regular rhythm.     Pulses: Normal pulses.     Heart sounds: Normal heart sounds.  Pulmonary:     Effort: Pulmonary effort is normal.     Breath sounds: Normal breath sounds. No wheezing, rhonchi or rales.  Skin:    General: Skin is warm and dry.  Neurological:     Mental Status: She is alert.  Psychiatric:        Speech: Speech normal.        Behavior: Behavior normal.        Thought Content: Thought content normal.

## 2023-10-15 NOTE — Telephone Encounter (Signed)
 Spoke to [place] she will fax over  Colonoscopy report to us 

## 2023-10-16 NOTE — Patient Instructions (Signed)
 Nice to see you!

## 2023-10-16 NOTE — Assessment & Plan Note (Signed)
 Chronic, stable. Continue lexapro  10mg  every day.

## 2023-10-21 ENCOUNTER — Other Ambulatory Visit

## 2023-11-05 ENCOUNTER — Telehealth: Payer: Self-pay | Admitting: Podiatry

## 2023-11-05 NOTE — Telephone Encounter (Signed)
 error

## 2023-11-19 ENCOUNTER — Encounter: Payer: Self-pay | Admitting: *Deleted

## 2023-11-20 ENCOUNTER — Other Ambulatory Visit

## 2023-11-20 ENCOUNTER — Ambulatory Visit: Admitting: Internal Medicine

## 2023-12-10 ENCOUNTER — Other Ambulatory Visit

## 2023-12-18 ENCOUNTER — Ambulatory Visit: Admitting: Podiatry

## 2023-12-31 ENCOUNTER — Other Ambulatory Visit

## 2023-12-31 DIAGNOSIS — F419 Anxiety disorder, unspecified: Secondary | ICD-10-CM | POA: Diagnosis not present

## 2023-12-31 DIAGNOSIS — E785 Hyperlipidemia, unspecified: Secondary | ICD-10-CM | POA: Diagnosis not present

## 2023-12-31 LAB — CBC WITH DIFFERENTIAL/PLATELET
Basophils Absolute: 0 K/uL (ref 0.0–0.1)
Basophils Relative: 0.5 % (ref 0.0–3.0)
Eosinophils Absolute: 0.1 K/uL (ref 0.0–0.7)
Eosinophils Relative: 2.7 % (ref 0.0–5.0)
HCT: 42.3 % (ref 36.0–46.0)
Hemoglobin: 13.9 g/dL (ref 12.0–15.0)
Lymphocytes Relative: 35.3 % (ref 12.0–46.0)
Lymphs Abs: 1.3 K/uL (ref 0.7–4.0)
MCHC: 33 g/dL (ref 30.0–36.0)
MCV: 92.3 fl (ref 78.0–100.0)
Monocytes Absolute: 0.3 K/uL (ref 0.1–1.0)
Monocytes Relative: 9.3 % (ref 3.0–12.0)
Neutro Abs: 1.9 K/uL (ref 1.4–7.7)
Neutrophils Relative %: 52.2 % (ref 43.0–77.0)
Platelets: 180 K/uL (ref 150.0–400.0)
RBC: 4.59 Mil/uL (ref 3.87–5.11)
RDW: 13.2 % (ref 11.5–15.5)
WBC: 3.7 K/uL — ABNORMAL LOW (ref 4.0–10.5)

## 2023-12-31 LAB — COMPREHENSIVE METABOLIC PANEL WITH GFR
ALT: 18 U/L (ref 0–35)
AST: 19 U/L (ref 0–37)
Albumin: 4.7 g/dL (ref 3.5–5.2)
Alkaline Phosphatase: 58 U/L (ref 39–117)
BUN: 17 mg/dL (ref 6–23)
CO2: 29 meq/L (ref 19–32)
Calcium: 9.2 mg/dL (ref 8.4–10.5)
Chloride: 103 meq/L (ref 96–112)
Creatinine, Ser: 0.61 mg/dL (ref 0.40–1.20)
GFR: 100.63 mL/min (ref >60.00)
Glucose, Bld: 70 mg/dL (ref 70–99)
Potassium: 3.8 meq/L (ref 3.5–5.1)
Sodium: 141 meq/L (ref 135–145)
Total Bilirubin: 0.7 mg/dL (ref 0.2–1.2)
Total Protein: 6.9 g/dL (ref 6.0–8.3)

## 2023-12-31 LAB — LIPID PANEL
Cholesterol: 174 mg/dL (ref 0–200)
HDL: 75.7 mg/dL (ref >39.00)
LDL Cholesterol: 89 mg/dL (ref 0–99)
NonHDL: 98.5
Total CHOL/HDL Ratio: 2
Triglycerides: 46 mg/dL (ref 0.0–149.0)
VLDL: 9.2 mg/dL (ref 0.0–40.0)

## 2023-12-31 LAB — VITAMIN D 25 HYDROXY (VIT D DEFICIENCY, FRACTURES): VITD: 46.71 ng/mL (ref 30.00–100.00)

## 2023-12-31 LAB — HEMOGLOBIN A1C: Hgb A1c MFr Bld: 5.7 % (ref 4.6–6.5)

## 2023-12-31 LAB — TSH: TSH: 1.89 u[IU]/mL (ref 0.35–5.50)

## 2024-01-01 ENCOUNTER — Ambulatory Visit: Payer: Self-pay | Admitting: Family

## 2024-01-01 DIAGNOSIS — R899 Unspecified abnormal finding in specimens from other organs, systems and tissues: Secondary | ICD-10-CM

## 2024-01-21 NOTE — Telephone Encounter (Signed)
 open in error

## 2024-01-28 ENCOUNTER — Other Ambulatory Visit (INDEPENDENT_AMBULATORY_CARE_PROVIDER_SITE_OTHER)

## 2024-01-28 DIAGNOSIS — R899 Unspecified abnormal finding in specimens from other organs, systems and tissues: Secondary | ICD-10-CM

## 2024-01-29 ENCOUNTER — Ambulatory Visit: Payer: Self-pay | Admitting: Family

## 2024-01-29 LAB — CBC WITH DIFFERENTIAL/PLATELET
Basophils Absolute: 0 K/uL (ref 0.0–0.1)
Basophils Relative: 0.9 % (ref 0.0–3.0)
Eosinophils Absolute: 0.1 K/uL (ref 0.0–0.7)
Eosinophils Relative: 3 % (ref 0.0–5.0)
HCT: 40.1 % (ref 36.0–46.0)
Hemoglobin: 13.5 g/dL (ref 12.0–15.0)
Lymphocytes Relative: 38 % (ref 12.0–46.0)
Lymphs Abs: 1.8 K/uL (ref 0.7–4.0)
MCHC: 33.7 g/dL (ref 30.0–36.0)
MCV: 91.1 fl (ref 78.0–100.0)
Monocytes Absolute: 0.5 K/uL (ref 0.1–1.0)
Monocytes Relative: 10.2 % (ref 3.0–12.0)
Neutro Abs: 2.2 K/uL (ref 1.4–7.7)
Neutrophils Relative %: 47.9 % (ref 43.0–77.0)
Platelets: 177 K/uL (ref 150.0–400.0)
RBC: 4.4 Mil/uL (ref 3.87–5.11)
RDW: 12.9 % (ref 11.5–15.5)
WBC: 4.6 K/uL (ref 4.0–10.5)

## 2024-03-18 ENCOUNTER — Encounter: Payer: Self-pay | Admitting: Internal Medicine

## 2024-03-18 ENCOUNTER — Ambulatory Visit: Admitting: Internal Medicine

## 2024-03-18 VITALS — BP 110/82 | HR 65 | Temp 97.6°F | Ht 69.0 in | Wt 126.4 lb

## 2024-03-18 DIAGNOSIS — I7300 Raynaud's syndrome without gangrene: Secondary | ICD-10-CM | POA: Insufficient documentation

## 2024-03-18 DIAGNOSIS — M65311 Trigger thumb, right thumb: Secondary | ICD-10-CM | POA: Insufficient documentation

## 2024-03-18 NOTE — Progress Notes (Signed)
 "  Acute Office Visit  Subjective:     Patient ID: Margaret Mcfarland, female    DOB: 07/25/1970, 54 y.o.   MRN: [id number]  Chief Complaint  Patient presents with   Acute Visit    Bilateral cold painful hands Red fingertips Bilateral lower legs gets cold & it is Hard to get them Warm Right thumb feels as if it is stuck or stiff when she pops it Big toes hurt mainly on the right    Discussed the use of AI scribe software for clinical note transcription with the patient, who gave verbal consent to proceed.  History of Present Illness Margaret Mcfarland is a 54 year old female who presents with cold sensitivity and hand symptoms.  Cold sensitivity and digital color changes - Cold sensitivity present for approximately one year, predominantly affecting the hands. - Fingertips turn red when exposed to cold environments, such as retrieving items from the freezer or going outside. - Legs require extended time to warm up after cold exposure, symptom present for about one week.  Hand and wrist swelling - Swelling in hands and wrists associated with cold exposure.  Lower extremity pain - Occasional pain in big toes, right toe more affected. - Toe pain present for several months. - History of fall approximately six months ago, possibly contributing to toe discomfort. - Recent onset of pain in legs.  Thumb locking sensation - Infrequent locking sensation in thumb. - Does not significantly impact daily activities. - Works in [employer] and uses thumb regularly.  Negative review of systems - No smoking. - No abdominal pain. - No additional swelling beyond hands and wrists. - No changes in skin or decreased hair growth on legs.    Review of Systems  Constitutional: Negative.   HENT: Negative.    Respiratory: Negative.    Cardiovascular: Negative.   Gastrointestinal: Negative.   Musculoskeletal: Negative.   Skin:        Fingers become painful and red when exposed to cold  Neurological:  Negative.   Psychiatric/Behavioral: Negative.          Objective:    BP 110/82   Pulse 65   Temp 97.6 F (36.4 C)   Ht 5' 9 (1.753 m)   Wt 126 lb 6.4 oz (57.3 kg)   LMP  (LMP Unknown)   SpO2 98%   BMI 18.67 kg/m    Physical Exam Constitutional:      Appearance: Normal appearance.  HENT:     Head: Normocephalic and atraumatic.  Cardiovascular:     Rate and Rhythm: Normal rate and regular rhythm.     Heart sounds: Normal heart sounds.  Pulmonary:     Effort: Pulmonary effort is normal.     Breath sounds: Normal breath sounds. No wheezing, rhonchi or rales.  Abdominal:     General: Bowel sounds are normal. There is no distension.     Palpations: Abdomen is soft.     Tenderness: There is no abdominal tenderness. There is no guarding or rebound.  Musculoskeletal:        General: No swelling or tenderness.     Right lower leg: No edema.     Left lower leg: No edema.  Neurological:     Mental Status: She is alert.  Psychiatric:        Mood and Affect: Mood normal.        Behavior: Behavior normal.     No results found for any visits on 03/18/24.  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  She also complains of some joint stiffness that happens occasionally in her hands as well as intermittent swelling of the joints of her  fingers and wrists - Patient has had similar symptoms in her toes for the last 6 months worse on the right than the left -On exam, her pulses are intact.  Currently, no evidence of Raynaud's -Will check CBC, CMP, rheumatoid factor, ANA, CCP as well as antiscleroderma antibodies - We will likely refer patient to rheumatology after follow-up on results of her lab work -Conservative measures including wearing warm gloves and socks were recommended to the patient -No further workup at this time      Relevant Orders   Antinuclear Antib (ANA)   Rheumatoid Factor   Cyclic citrul peptide antibody, IgG ([facility])   Anti-Scleroderma Antibody   CBC with Differential/Platelet   Comprehensive metabolic panel with GFR    No orders of the defined types were placed in this encounter.   No follow-ups on file.  Margaret Nickolson, MD   "

## 2024-03-18 NOTE — Assessment & Plan Note (Addendum)
-   This problem is acute and uncontrolled -Patient complains of significant pain and erythema in her fingers on exposure to cold.  She also complains of some joint stiffness that happens occasionally in her hands as well as intermittent swelling of the joints of her fingers and wrists - Patient has had similar symptoms in her toes for the last 6 months worse on the right than the left -On exam, her pulses are intact.  Currently, no evidence of Raynaud's -Will check CBC, CMP, rheumatoid factor, ANA, CCP as well as antiscleroderma antibodies - We will likely refer patient to rheumatology after follow-up on results of her lab work -Conservative measures including wearing warm gloves and socks were recommended to the patient -No further workup at this time

## 2024-03-18 NOTE — Assessment & Plan Note (Signed)
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

## 2024-03-18 NOTE — Patient Instructions (Addendum)
" °  VISIT SUMMARY: During your visit, we discussed your cold sensitivity, hand and wrist swelling, lower extremity pain, and thumb locking sensation. We also reviewed your overall health and lifestyle habits.  YOUR PLAN: -RAYNAUD'S PHENOMENON: Raynaud's phenomenon is a condition where small blood vessels in your extremities, such as your fingers and toes, overreact to cold temperatures or stress, causing them to change color and swell. We have ordered blood work to rule out other conditions and recommend wearing gloves and thicker socks to keep your extremities warm. Please follow up with your primary care provider in one month.  -TRIGGER FINGER OF THE THUMB: Trigger finger is a condition where your thumb gets stuck in a bent position due to overuse. We recommend resting your thumb and avoiding activities that may strain it.  I would also recommend use of a thumb splint.  If the symptoms worsen, we may refer you to a hand surgeon.   INSTRUCTIONS: Please follow up with your primary care provider in one month for further evaluation of your cold sensitivity and to review your blood work results.                      Contains text generated by Abridge.                                 Contains text generated by Abridge.   "

## 2024-03-19 LAB — COMPREHENSIVE METABOLIC PANEL WITH GFR
ALT: 26 U/L (ref 3–35)
AST: 22 U/L (ref 5–37)
Albumin: 4.7 g/dL (ref 3.5–5.2)
Alkaline Phosphatase: 58 U/L (ref 39–117)
BUN: 19 mg/dL (ref 6–23)
CO2: 31 meq/L (ref 19–32)
Calcium: 9.4 mg/dL (ref 8.4–10.5)
Chloride: 104 meq/L (ref 96–112)
Creatinine, Ser: 0.67 mg/dL (ref 0.40–1.20)
GFR: 98.23 mL/min
Glucose, Bld: 93 mg/dL (ref 70–99)
Potassium: 3.9 meq/L (ref 3.5–5.1)
Sodium: 140 meq/L (ref 135–145)
Total Bilirubin: 0.5 mg/dL (ref 0.2–1.2)
Total Protein: 7 g/dL (ref 6.0–8.3)

## 2024-03-19 LAB — CBC WITH DIFFERENTIAL/PLATELET
Basophils Absolute: 0 K/uL (ref 0.0–0.1)
Basophils Relative: 1 % (ref 0.0–3.0)
Eosinophils Absolute: 0.1 K/uL (ref 0.0–0.7)
Eosinophils Relative: 2.8 % (ref 0.0–5.0)
HCT: 40.5 % (ref 36.0–46.0)
Hemoglobin: 13.7 g/dL (ref 12.0–15.0)
Lymphocytes Relative: 37.3 % (ref 12.0–46.0)
Lymphs Abs: 1.5 K/uL (ref 0.7–4.0)
MCHC: 33.9 g/dL (ref 30.0–36.0)
MCV: 90.9 fl (ref 78.0–100.0)
Monocytes Absolute: 0.4 K/uL (ref 0.1–1.0)
Monocytes Relative: 9.5 % (ref 3.0–12.0)
Neutro Abs: 2 K/uL (ref 1.4–7.7)
Neutrophils Relative %: 49.4 % (ref 43.0–77.0)
Platelets: 175 K/uL (ref 150.0–400.0)
RBC: 4.46 Mil/uL (ref 3.87–5.11)
RDW: 13.3 % (ref 11.5–15.5)
WBC: 4.1 K/uL (ref 4.0–10.5)

## 2024-03-20 ENCOUNTER — Ambulatory Visit: Payer: Self-pay | Admitting: Internal Medicine

## 2024-03-20 DIAGNOSIS — I7300 Raynaud's syndrome without gangrene: Secondary | ICD-10-CM

## 2024-03-28 LAB — SPECIMEN STATUS REPORT

## 2024-03-28 LAB — FANA STAINING PATTERNS: Speckled Pattern: 1:160 {titer} — ABNORMAL HIGH

## 2024-03-28 LAB — RHEUMATOID FACTOR: Rheumatoid fact SerPl-aCnc: <10 [IU]/mL

## 2024-03-28 LAB — ANTI-SCLERODERMA ANTIBODY: Scleroderma (Scl-70) (ENA) Antibody, IgG: <0.2 AI (ref 0.0–0.9)

## 2024-03-28 LAB — CYCLIC CITRUL PEPTIDE ANTIBODY, IGG/IGA: Cyclic Citrullin Peptide Ab: 9 U (ref 0–19)

## 2024-03-28 LAB — ANTINUCLEAR ANTIBODIES, IFA: ANA Titer 1: POSITIVE — AB

## 2024-03-30 ENCOUNTER — Ambulatory Visit: Payer: Self-pay

## 2024-03-30 ENCOUNTER — Telehealth: Payer: Self-pay | Admitting: Family

## 2024-03-30 NOTE — Telephone Encounter (Signed)
 Attempted to call pt x2. VM left for pt. Will attempt to contact pt at a later time.   Copied from CRM #[id number]. Topic: Clinical - Medical Advice >> Mar 30, 2024  2:22 PM Mercedes MATSU wrote: Reason for CRM: Patient called in stating that she has a fungus on her big toe, and she wants to know what she should do. Patient is requesting a call back and can be reached at [phone].

## 2024-03-30 NOTE — Telephone Encounter (Signed)
 Copied from CRM #[id number]. Topic: Clinical - Medical Advice >> Mar 30, 2024  2:22 PM Margaret Mcfarland wrote: Reason for CRM: Patient called in stating that she has a fungus on her big toe, and she wants to know what she should do. Patient is requesting a call back and can be reached at [phone].

## 2024-03-30 NOTE — Telephone Encounter (Signed)
" ° ° ° °  Copied from CRM #[id number]. Topic: Clinical - Medical Advice >> Mar 30, 2024  2:22 PM Mercedes MATSU wrote: Reason for CRM: Patient called in stating that she has a fungus on her big toe, and she wants to know what she should do. Patient is requesting a call back and can be reached at [phone]. "

## 2024-04-01 ENCOUNTER — Ambulatory Visit: Payer: Self-pay

## 2024-04-01 NOTE — Telephone Encounter (Signed)
 FYI Only or Action Required?: Action required by provider: clinical question for provider.  Patient was last seen in primary care on 03/18/2024 by Margaret Claude, MD.  Called Nurse Triage reporting Hand Pain and Foot Problem.    Triage Disposition: Call PCP Now  Patient/caregiver understands and will follow disposition?: No, wishes to speak with PCP        Copied from CRM #[id number]. Topic: Clinical - Medical Advice >> Apr 01, 2024 12:11 PM Margaret Mcfarland wrote: Reason for CRM:  [name] is calling and wants to know what needs to be done about her trigger finger. It is not getting better but not getting worse.  She came in on 03/18/24 and saw Dr. Onesimo about this and her fungus on left big toe. Her number is [phone] to call and discuss. Reason for Disposition  [1] Caller requests to speak ONLY to PCP AND [2] URGENT question  Answer Assessment - Initial Assessment Questions 1. REASON FOR CALL or QUESTION: What is your reason for calling today? or How can I best    Patient called in to triage to report her finger pain is still present. This RN provided the rheumatology referral information, and patient stated she will call that office to get scheduled. She also stated during her 1/14 visit, she mentioned a fungus on her left big toe, but has not heard back about the treatment plan for that issue. Please advise.  Protocols used: PCP Call - No Triage-A-AH

## 2024-04-01 NOTE — Telephone Encounter (Signed)
 Spoke with patient to make her aware of Dr Yates recommendations. Patient states she did reach out to the Rheumatologist office and they are not seeing NP until beginning of April. Patient states does she need to be seen for the referral to the Hand Surgeon as she was just seen within the last couple of week with provider. Patient also states what are the recommendations, as she can't get in with Rheumatologist until April in regards to the toenail fungus. Please advise?

## 2024-04-02 ENCOUNTER — Telehealth: Payer: Self-pay

## 2024-04-02 ENCOUNTER — Other Ambulatory Visit: Payer: Self-pay | Admitting: Internal Medicine

## 2024-04-02 DIAGNOSIS — B351 Tinea unguium: Secondary | ICD-10-CM | POA: Insufficient documentation

## 2024-04-02 DIAGNOSIS — M65311 Trigger thumb, right thumb: Secondary | ICD-10-CM

## 2024-04-02 MED ORDER — EFINACONAZOLE 10 % EX SOLN: CUTANEOUS | 2 refills | Status: AC

## 2024-04-02 NOTE — Telephone Encounter (Signed)
 Copied from CRM #[id number]. Topic: Clinical - Prescription Issue >> Apr 02, 2024 12:06 PM Drema MATSU wrote: Reason for CRM: [facility] wants to know how many toenails that pt will be using Efinaconazole  solution on.

## 2024-04-02 NOTE — Telephone Encounter (Signed)
 Called Patient and she states 1 toenail and is aware of Dr. Yates plan of care and she is agreeable.

## 2024-04-03 NOTE — Telephone Encounter (Unsigned)
 Copied from CRM [id number]. Topic: Clinical - Prescription Issue >> Apr 03, 2024  4:15 PM Margaret Mcfarland wrote: Reason for CRM: Patient stated the medication that Dr. Yates prescribed her for her toenail is $1700 and it is not covered by her insurance. Does he have another medication he can prescribe her that is covered.?

## 2024-04-06 ENCOUNTER — Telehealth: Payer: Self-pay

## 2024-04-06 ENCOUNTER — Other Ambulatory Visit (HOSPITAL_COMMUNITY): Payer: Self-pay

## 2024-04-06 ENCOUNTER — Other Ambulatory Visit: Payer: Self-pay | Admitting: Internal Medicine

## 2024-04-06 DIAGNOSIS — B351 Tinea unguium: Secondary | ICD-10-CM

## 2024-04-06 NOTE — Telephone Encounter (Signed)
 Pharmacy Patient Advocate Encounter   Received notification from [employer] C[employer] KEY that prior authorization for Efinaconazole  10 % SOLN JULIA 10% is due for renewal.   Insurance verification completed.   The patient is insured through [facility].  Action: Per test claim, medication is not covered due to plan/benefit EXCLUSION,PLAN HAS REFERRED  SEE TEST CLAIM BELOW YOU CAN CHANGE IF YOU AGREE WITH THE PREFERRED DRUG.  PA not submitted at this time MEDICAL NECSTY ONLY    PLEASE ADVISE DID A COUPLE TEST CLAIM FOR THE PREFERRED DRUG COST

## 2024-04-06 NOTE — Assessment & Plan Note (Signed)
-   Patient with onychomycosis - Insurance did not cover topical treatment with efinaconazole  - Patient with likely underlying rheum disease with an appointment in April for evaluation. Would hold off on terbinafine  given possible interactions - Will refer to podiatry for eval

## 2024-04-07 ENCOUNTER — Telehealth: Payer: Self-pay

## 2024-04-07 ENCOUNTER — Other Ambulatory Visit: Payer: Self-pay

## 2024-04-07 ENCOUNTER — Other Ambulatory Visit: Payer: Self-pay | Admitting: Internal Medicine

## 2024-04-07 DIAGNOSIS — I7300 Raynaud's syndrome without gangrene: Secondary | ICD-10-CM

## 2024-04-07 DIAGNOSIS — B351 Tinea unguium: Secondary | ICD-10-CM

## 2024-04-07 MED ORDER — TERBINAFINE HCL 250 MG PO TABS: 250.0000 mg | ORAL_TABLET | Freq: Every day | ORAL | 0 refills | Status: AC

## 2024-04-07 NOTE — Telephone Encounter (Signed)
 Pt has been notified.

## 2024-04-07 NOTE — Telephone Encounter (Signed)
Attempted to call Patient-no answer or voicemail 

## 2024-04-07 NOTE — Telephone Encounter (Signed)
 Patient is aware of the lamisil  and is scheduled for a 4 week follow up with you on 05/06/24 at 2:40 . Patient will also get repeat blood work that day.

## 2024-04-07 NOTE — Telephone Encounter (Signed)
 Dr. Onesimo sent in the Lamisil  that the Pharmacy suggested.

## 2024-05-06 ENCOUNTER — Ambulatory Visit: Admitting: Internal Medicine
